# Patient Record
Sex: Female | Born: 1955 | Race: White | Hispanic: No | Marital: Married | Smoking: Former smoker
Health system: Southern US, Community
[De-identification: ages and names within clinical notes are randomized; demographics above are authoritative.]

## PROBLEM LIST (undated history)

## (undated) DIAGNOSIS — K635 Polyp of colon: Secondary | ICD-10-CM

## (undated) DIAGNOSIS — M1990 Unspecified osteoarthritis, unspecified site: Secondary | ICD-10-CM

## (undated) DIAGNOSIS — N7091 Salpingitis, unspecified: Secondary | ICD-10-CM

## (undated) DIAGNOSIS — M810 Age-related osteoporosis without current pathological fracture: Secondary | ICD-10-CM

## (undated) HISTORY — PX: BREAST EXCISIONAL BIOPSY: SUR124

## (undated) HISTORY — PX: OTHER SURGICAL HISTORY: SHX169

## (undated) HISTORY — DX: Unspecified osteoarthritis, unspecified site: M19.90

## (undated) HISTORY — DX: Salpingitis, unspecified: N70.91

## (undated) HISTORY — DX: Polyp of colon: K63.5

## (undated) HISTORY — PX: COLONOSCOPY: SHX174

## (undated) HISTORY — PX: POLYPECTOMY: SHX149

## (undated) HISTORY — PX: APPENDECTOMY: SHX54

## (undated) HISTORY — DX: Age-related osteoporosis without current pathological fracture: M81.0

---

## 1998-01-17 ENCOUNTER — Other Ambulatory Visit: Admission: RE | Admit: 1998-01-17 | Discharge: 1998-01-17 | Payer: Self-pay | Admitting: Obstetrics and Gynecology

## 1998-02-20 ENCOUNTER — Ambulatory Visit (HOSPITAL_BASED_OUTPATIENT_CLINIC_OR_DEPARTMENT_OTHER): Admission: RE | Admit: 1998-02-20 | Discharge: 1998-02-20 | Payer: Self-pay

## 1999-02-17 ENCOUNTER — Other Ambulatory Visit: Admission: RE | Admit: 1999-02-17 | Discharge: 1999-02-17 | Payer: Self-pay | Admitting: Obstetrics and Gynecology

## 1999-04-08 ENCOUNTER — Encounter: Admission: RE | Admit: 1999-04-08 | Discharge: 1999-04-08 | Payer: Self-pay | Admitting: Internal Medicine

## 1999-09-19 ENCOUNTER — Emergency Department (HOSPITAL_COMMUNITY): Admission: EM | Admit: 1999-09-19 | Discharge: 1999-09-19 | Payer: Self-pay | Admitting: Emergency Medicine

## 1999-09-19 ENCOUNTER — Encounter: Payer: Self-pay | Admitting: Emergency Medicine

## 2000-04-14 ENCOUNTER — Encounter: Payer: Self-pay | Admitting: Internal Medicine

## 2000-04-14 ENCOUNTER — Encounter: Admission: RE | Admit: 2000-04-14 | Discharge: 2000-04-14 | Payer: Self-pay | Admitting: Internal Medicine

## 2000-04-15 ENCOUNTER — Other Ambulatory Visit: Admission: RE | Admit: 2000-04-15 | Discharge: 2000-04-15 | Payer: Self-pay | Admitting: Gynecology

## 2000-10-02 HISTORY — PX: FINGER FRACTURE SURGERY: SHX638

## 2001-12-14 ENCOUNTER — Other Ambulatory Visit: Admission: RE | Admit: 2001-12-14 | Discharge: 2001-12-14 | Payer: Self-pay | Admitting: Gynecology

## 2001-12-23 ENCOUNTER — Encounter: Admission: RE | Admit: 2001-12-23 | Discharge: 2001-12-23 | Payer: Self-pay | Admitting: Gynecology

## 2001-12-23 ENCOUNTER — Encounter: Payer: Self-pay | Admitting: Gynecology

## 2002-12-26 ENCOUNTER — Encounter: Payer: Self-pay | Admitting: Gynecology

## 2002-12-26 ENCOUNTER — Encounter: Admission: RE | Admit: 2002-12-26 | Discharge: 2002-12-26 | Payer: Self-pay | Admitting: Gynecology

## 2003-01-02 ENCOUNTER — Other Ambulatory Visit: Admission: RE | Admit: 2003-01-02 | Discharge: 2003-01-02 | Payer: Self-pay | Admitting: Gynecology

## 2004-01-17 ENCOUNTER — Encounter: Admission: RE | Admit: 2004-01-17 | Discharge: 2004-01-17 | Payer: Self-pay | Admitting: Gynecology

## 2004-01-23 ENCOUNTER — Other Ambulatory Visit: Admission: RE | Admit: 2004-01-23 | Discharge: 2004-01-23 | Payer: Self-pay | Admitting: Gynecology

## 2005-01-29 ENCOUNTER — Other Ambulatory Visit: Admission: RE | Admit: 2005-01-29 | Discharge: 2005-01-29 | Payer: Self-pay | Admitting: Gynecology

## 2005-01-29 ENCOUNTER — Encounter: Admission: RE | Admit: 2005-01-29 | Discharge: 2005-01-29 | Payer: Self-pay | Admitting: Gynecology

## 2005-02-04 ENCOUNTER — Encounter: Admission: RE | Admit: 2005-02-04 | Discharge: 2005-02-04 | Payer: Self-pay | Admitting: Internal Medicine

## 2005-02-06 ENCOUNTER — Encounter: Admission: RE | Admit: 2005-02-06 | Discharge: 2005-02-06 | Payer: Self-pay | Admitting: Internal Medicine

## 2005-07-14 ENCOUNTER — Encounter: Admission: RE | Admit: 2005-07-14 | Discharge: 2005-07-14 | Payer: Self-pay | Admitting: Internal Medicine

## 2006-02-01 ENCOUNTER — Other Ambulatory Visit: Admission: RE | Admit: 2006-02-01 | Discharge: 2006-02-01 | Payer: Self-pay | Admitting: Gynecology

## 2006-02-01 ENCOUNTER — Encounter: Admission: RE | Admit: 2006-02-01 | Discharge: 2006-02-01 | Payer: Self-pay | Admitting: Gynecology

## 2007-01-11 ENCOUNTER — Ambulatory Visit (HOSPITAL_BASED_OUTPATIENT_CLINIC_OR_DEPARTMENT_OTHER): Admission: RE | Admit: 2007-01-11 | Discharge: 2007-01-11 | Payer: Self-pay | Admitting: Orthopedic Surgery

## 2007-02-07 ENCOUNTER — Other Ambulatory Visit: Admission: RE | Admit: 2007-02-07 | Discharge: 2007-02-07 | Payer: Self-pay | Admitting: Gynecology

## 2007-02-09 ENCOUNTER — Encounter: Admission: RE | Admit: 2007-02-09 | Discharge: 2007-02-09 | Payer: Self-pay | Admitting: Gynecology

## 2008-02-08 ENCOUNTER — Other Ambulatory Visit: Admission: RE | Admit: 2008-02-08 | Discharge: 2008-02-08 | Payer: Self-pay | Admitting: Gynecology

## 2008-02-10 ENCOUNTER — Encounter: Admission: RE | Admit: 2008-02-10 | Discharge: 2008-02-10 | Payer: Self-pay | Admitting: Gynecology

## 2008-02-21 ENCOUNTER — Ambulatory Visit: Payer: Self-pay | Admitting: Internal Medicine

## 2009-02-12 ENCOUNTER — Encounter: Admission: RE | Admit: 2009-02-12 | Discharge: 2009-02-12 | Payer: Self-pay | Admitting: Gynecology

## 2009-02-25 ENCOUNTER — Ambulatory Visit: Payer: Self-pay | Admitting: Internal Medicine

## 2010-02-27 ENCOUNTER — Ambulatory Visit: Payer: Self-pay | Admitting: Internal Medicine

## 2010-03-21 ENCOUNTER — Ambulatory Visit: Payer: Self-pay | Admitting: Internal Medicine

## 2010-04-17 ENCOUNTER — Ambulatory Visit: Payer: Self-pay | Admitting: Internal Medicine

## 2010-09-09 ENCOUNTER — Telehealth: Payer: Self-pay | Admitting: Internal Medicine

## 2010-09-09 ENCOUNTER — Emergency Department (HOSPITAL_COMMUNITY)
Admission: EM | Admit: 2010-09-09 | Discharge: 2010-09-09 | Disposition: A | Discharge status: Home / Self Care | Payer: BC Managed Care – PPO | Attending: Emergency Medicine | Admitting: Emergency Medicine

## 2010-09-09 DIAGNOSIS — M069 Rheumatoid arthritis, unspecified: Secondary | ICD-10-CM | POA: Insufficient documentation

## 2010-09-09 DIAGNOSIS — S91309A Unspecified open wound, unspecified foot, initial encounter: Secondary | ICD-10-CM | POA: Insufficient documentation

## 2010-09-09 DIAGNOSIS — W268XXA Contact with other sharp object(s), not elsewhere classified, initial encounter: Secondary | ICD-10-CM | POA: Insufficient documentation

## 2010-09-09 NOTE — Telephone Encounter (Signed)
MD aware. No further orders obtained.

## 2010-09-16 NOTE — Op Note (Signed)
Katrina [name], Katrina [name]               ACCOUNT NO.:  [id number]   MEDICAL RECORD NO.:  [id number]          PATIENT TYPE:  AMB   LOCATION:  DSC                          FACILITY:  [facility]   PHYSICIAN:  Katy Fitch. Sypher, M.D. 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   OPERATION:  Release of right thumb A1 pulley.   OPERATING SURGEON:  Josephine Igo, M.D.   ASSISTANT:  Annye Rusk, PA-C   ANESTHESIA:  2% lidocaine metacarpal head level block of right thumb  with flexor sheath infiltration supplemented by IV sedation.   SUPERVISING ANESTHESIOLOGIST:  Dr. Sampson Goon.   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   She and her husband are [employer] and originally contacted me by  the internet stating that she had developed locking of her right thumb.   She attempted to seek treatment in the [place] without success.  Upon  return she was treated with steroid injection and activity modification  without success.  She has a background history of rheumatoid arthritis  managed by Dr. Syliva Overman and has done extremely well on a  combination of methotrexate and Mobic.  Due to a failure to respond to  nonoperative measures, she now presents for release of her right thumb  A1 pulley to correct her chronic trigger thumb prior to her next sailing  adventure.   PROCEDURE:  Katrina [name]  is brought to the operating room and placed in  the supine position upon the operating table.  Following an anesthesia  consult with Dr. Sampson Goon local anesthesia supplemented by IV sedation  was  recommended and selected by Katrina [name].   She was brought to room one and placed in the supine position upon the  operating table and under Dr. Jarrett Ables supervision IV sedation  provided.  The right arm was prepped with Betadine soap solution and  sterilely draped.  2% lidocaine was infiltrated in the path of the  intended incision.  The flexor sheath was infiltrated as well.   After a few moments excellent anesthesia of the thumb was achieved.  The  right arm was prepped with Betadine soap and solution and sterilely  draped with sterile stockinette and impervious arthroscopy drapes.   The procedure commenced with exsanguination of Katrina [name]'s right arm with an  Esmarch bandage and inflation of the arterial tourniquet to 220 mmHg.  A  short transverse incision was fashioned directly over the palpably  thickened A1 pulley.  The subcutaneous tissues were carefully divided  taking care to identify the radial proper digital nerve to the thumb.  The subcutaneous fascia was moderately fibrotic due to the prolonged  inflammation of the flexor sheath.   The A1 pulley was gently isolated by elevation of fibrotic  tissue with a  Therapist, nutritional followed by placement of blunt retractors.  The A1 pulley  was incised with a scalpel with the incision lengthened proximally and  distally the full length of the pulley with tenotomy scissors.  The  flexor pollicis longus was identified and found to have a significant  swelling that was causing locking proximal to the pulley.   The oblique pulley and A2 pulleys were preserved.  Katrina [name]  demonstrated full active range of motion of the IP joint on the  operating table.  The wound was then inspected for bleeding points  followed by repair of the skin with a mattress suture of 5-0 nylon.  There were no apparent complications.   Katrina [name] tolerated surgery and anesthesia well.  She was transferred to  the recovery room with  stable signs.   For aftercare  she is provided a prescription for Darvocet N 100 one p.o.  q.4-6h. p.r.n. pain, 20 tablets without refill.      Katy Fitch Sypher, M.D.  Electronically Signed     RVS/MEDQ  D:  01/11/2007  T:  01/12/2007  Job:  [id number]   cc:   Lemmie Evens, M.D.  Katy Fitch Sypher, M.D.

## 2011-02-13 LAB — POCT HEMOGLOBIN-HEMACUE
Hemoglobin: 13.7
Operator id: 112821

## 2011-03-06 ENCOUNTER — Encounter: Payer: Self-pay | Admitting: Internal Medicine

## 2011-03-09 ENCOUNTER — Other Ambulatory Visit: Payer: BC Managed Care – PPO | Admitting: Internal Medicine

## 2011-03-09 DIAGNOSIS — M069 Rheumatoid arthritis, unspecified: Secondary | ICD-10-CM

## 2011-03-09 DIAGNOSIS — Z79899 Other long term (current) drug therapy: Secondary | ICD-10-CM

## 2011-03-09 LAB — COMPREHENSIVE METABOLIC PANEL
ALT: 15 U/L (ref 0–35)
AST: 20 U/L (ref 0–37)
Albumin: 4.3 g/dL (ref 3.5–5.2)
Alkaline Phosphatase: 60 U/L (ref 39–117)
BUN: 13 mg/dL (ref 6–23)
CO2: 29 mEq/L (ref 19–32)
Calcium: 9.3 mg/dL (ref 8.4–10.5)
Chloride: 104 mEq/L (ref 96–112)
Creat: 0.68 mg/dL (ref 0.50–1.10)
Glucose, Bld: 88 mg/dL (ref 70–99)
Potassium: 5.1 mEq/L (ref 3.5–5.3)
Sodium: 139 mEq/L (ref 135–145)
Total Bilirubin: 0.7 mg/dL (ref 0.3–1.2)
Total Protein: 7 g/dL (ref 6.0–8.3)

## 2011-03-09 LAB — CBC WITH DIFFERENTIAL/PLATELET
Basophils Absolute: 0.1 10*3/uL (ref 0.0–0.1)
Basophils Relative: 1 % (ref 0–1)
Eosinophils Absolute: 0.3 10*3/uL (ref 0.0–0.7)
Eosinophils Relative: 4 % (ref 0–5)
HCT: 43.5 % (ref 36.0–46.0)
Hemoglobin: 14.2 g/dL (ref 12.0–15.0)
Lymphocytes Relative: 44 % (ref 12–46)
Lymphs Abs: 2.9 10*3/uL (ref 0.7–4.0)
MCH: 31.8 pg (ref 26.0–34.0)
MCHC: 32.6 g/dL (ref 30.0–36.0)
MCV: 97.3 fL (ref 78.0–100.0)
Monocytes Absolute: 0.6 10*3/uL (ref 0.1–1.0)
Monocytes Relative: 9 % (ref 3–12)
Neutro Abs: 2.7 10*3/uL (ref 1.7–7.7)
Neutrophils Relative %: 42 % — ABNORMAL LOW (ref 43–77)
Platelets: 274 10*3/uL (ref 150–400)
RBC: 4.47 MIL/uL (ref 3.87–5.11)
RDW: 13.2 % (ref 11.5–15.5)
WBC: 6.5 10*3/uL (ref 4.0–10.5)

## 2011-03-09 LAB — TSH: TSH: 2.504 u[IU]/mL (ref 0.350–4.500)

## 2011-03-09 LAB — VITAMIN D 25 HYDROXY (VIT D DEFICIENCY, FRACTURES): Vit D, 25-Hydroxy: 60 ng/mL (ref 30–89)

## 2011-03-10 ENCOUNTER — Ambulatory Visit (INDEPENDENT_AMBULATORY_CARE_PROVIDER_SITE_OTHER): Payer: BC Managed Care – PPO | Admitting: Internal Medicine

## 2011-03-10 ENCOUNTER — Encounter: Payer: Self-pay | Admitting: Internal Medicine

## 2011-03-10 VITALS — BP 102/66 | HR 68 | Temp 97.7°F | Ht 62.0 in | Wt 136.0 lb

## 2011-03-10 DIAGNOSIS — Z23 Encounter for immunization: Secondary | ICD-10-CM

## 2011-03-10 DIAGNOSIS — M069 Rheumatoid arthritis, unspecified: Secondary | ICD-10-CM

## 2011-03-10 DIAGNOSIS — Z0000 Encounter for general adult medical examination without abnormal findings: Secondary | ICD-10-CM

## 2011-03-10 LAB — LIPID PANEL
Cholesterol: 181 mg/dL (ref 0–200)
HDL: 74 mg/dL (ref >39)
LDL Cholesterol: 86 mg/dL (ref 0–99)
Total CHOL/HDL Ratio: 2.4 Ratio
Triglycerides: 107 mg/dL (ref <150)
VLDL: 21 mg/dL (ref 0–40)

## 2011-03-10 LAB — POCT URINALYSIS DIPSTICK
Bilirubin, UA: NEGATIVE
Blood, UA: NEGATIVE
Glucose, UA: NEGATIVE
Ketones, UA: NEGATIVE
Leukocytes, UA: NEGATIVE
Nitrite, UA: NEGATIVE
Protein, UA: NEGATIVE
Spec Grav, UA: 1.01
Urobilinogen, UA: 0.2
pH, UA: 6.5

## 2011-04-05 ENCOUNTER — Encounter: Payer: Self-pay | Admitting: Internal Medicine

## 2011-04-05 DIAGNOSIS — M069 Rheumatoid arthritis, unspecified: Secondary | ICD-10-CM | POA: Insufficient documentation

## 2011-04-05 NOTE — Progress Notes (Signed)
  Subjective:    Patient ID: Katrina [name], female    DOB: 1955-12-28, 55 y.o.   MRN: [id number]  HPI 55 year old white female with history of rheumatoid arthritis doing well on Humira for health maintenance and they wish and medical problems. History of fractured third metatarsal 2008. History of right trigger finger release September 2008. No known drug allergies. Had colonoscopy 2007. Dr. Lorenso Courier is GYN physician. Had Pneumovax October 2010. Gets annual influenza immunization in 1 was given today.Tdap given December 2011.  Dose of Humira is 40 mg subcutaneously every other week. She does take methotrexate 20 mg weekly. She does take vitamin D and calcium.  Additional history: Appendectomy 1976.  Family history remarkable for father with hypertension.  Social history patient does not smoke social alcohol consumption. 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 She and her husband as but considerable time sailing. She quit her job to do this when she returns she has been unable to find employment. History of polyarthritis  involving hands wrists shoulders knees ankles and toes. Strongly positive CCP antibody. Diagnosed with rheumatoid arthritis by Dr. Jimmy Footman    Review of Systems  Constitutional: Negative.   HENT: Negative.   Eyes: Negative.   Respiratory: Negative.   Cardiovascular: Negative.   Genitourinary: Negative.   Musculoskeletal:       Pain in hands, feet, knees, and ankles, wrist  Neurological: Negative.   Hematological: Negative.   Psychiatric/Behavioral: Negative.        Objective:   Physical Exam  Vitals reviewed. Constitutional: She is oriented to person, place, and time. She appears well-nourished. No distress.  HENT:  Head: Normocephalic.  Right Ear: External ear normal.  Left Ear: External ear normal.  Mouth/Throat: Oropharynx is clear and moist.  Eyes: Conjunctivae and EOM are normal. Pupils are equal, round, and reactive to light. No  scleral icterus.  Neck: Neck supple. No JVD present. No thyromegaly present.  Cardiovascular: Normal rate, regular rhythm, normal heart sounds and intact distal pulses.   No murmur heard. Abdominal: Soft. Bowel sounds are normal. She exhibits no mass. There is no tenderness.  Genitourinary:       Deferred to GYN  Musculoskeletal: She exhibits tenderness. She exhibits no edema.       Some tenderness in joints in hands bilaterally. No erythema or increased warmth in hands  Lymphadenopathy:    She has no cervical adenopathy.  Neurological: She is alert and oriented to person, place, and time. She has normal reflexes. No cranial nerve deficit. Coordination normal.  Skin: Skin is warm and dry.  Psychiatric: She has a normal mood and affect. Her behavior is normal.          Assessment & Plan:  Rheumatoid arthritis-stable on Humira and methotrexate. Followed by rheumatologist at [facility].  Plan return one year or as needed. Influenza immunization given today

## 2011-04-05 NOTE — Patient Instructions (Signed)
Return in one year. Influenza immunization given today. Continue to follow up with rheumatologist for rheumatoid arthritis

## 2012-01-27 ENCOUNTER — Ambulatory Visit (INDEPENDENT_AMBULATORY_CARE_PROVIDER_SITE_OTHER): Payer: BC Managed Care – PPO | Admitting: Internal Medicine

## 2012-01-27 DIAGNOSIS — Z23 Encounter for immunization: Secondary | ICD-10-CM

## 2012-01-28 ENCOUNTER — Ambulatory Visit: Payer: BC Managed Care – PPO | Admitting: Internal Medicine

## 2012-03-28 ENCOUNTER — Other Ambulatory Visit: Payer: BC Managed Care – PPO | Admitting: Internal Medicine

## 2012-03-28 DIAGNOSIS — M069 Rheumatoid arthritis, unspecified: Secondary | ICD-10-CM

## 2012-03-28 DIAGNOSIS — Z79899 Other long term (current) drug therapy: Secondary | ICD-10-CM

## 2012-03-28 LAB — CBC WITH DIFFERENTIAL/PLATELET
Basophils Absolute: 0.1 10*3/uL (ref 0.0–0.1)
Basophils Relative: 1 % (ref 0–1)
Eosinophils Absolute: 0.3 10*3/uL (ref 0.0–0.7)
Eosinophils Relative: 4 % (ref 0–5)
HCT: 42.5 % (ref 36.0–46.0)
Hemoglobin: 14.2 g/dL (ref 12.0–15.0)
Lymphocytes Relative: 36 % (ref 12–46)
Lymphs Abs: 2.7 10*3/uL (ref 0.7–4.0)
MCH: 31.9 pg (ref 26.0–34.0)
MCHC: 33.4 g/dL (ref 30.0–36.0)
MCV: 95.5 fL (ref 78.0–100.0)
Monocytes Absolute: 0.7 10*3/uL (ref 0.1–1.0)
Monocytes Relative: 10 % (ref 3–12)
Neutro Abs: 3.6 10*3/uL (ref 1.7–7.7)
Neutrophils Relative %: 49 % (ref 43–77)
Platelets: 298 10*3/uL (ref 150–400)
RBC: 4.45 MIL/uL (ref 3.87–5.11)
RDW: 13.2 % (ref 11.5–15.5)
WBC: 7.4 10*3/uL (ref 4.0–10.5)

## 2012-03-28 LAB — LIPID PANEL
Cholesterol: 187 mg/dL (ref 0–200)
HDL: 82 mg/dL (ref >39)
LDL Cholesterol: 90 mg/dL (ref 0–99)
Total CHOL/HDL Ratio: 2.3 Ratio
Triglycerides: 77 mg/dL (ref <150)
VLDL: 15 mg/dL (ref 0–40)

## 2012-03-28 LAB — COMPREHENSIVE METABOLIC PANEL
ALT: 15 U/L (ref 0–35)
AST: 17 U/L (ref 0–37)
Albumin: 4.1 g/dL (ref 3.5–5.2)
Alkaline Phosphatase: 66 U/L (ref 39–117)
BUN: 13 mg/dL (ref 6–23)
CO2: 29 mEq/L (ref 19–32)
Calcium: 9.2 mg/dL (ref 8.4–10.5)
Chloride: 104 mEq/L (ref 96–112)
Creat: 0.64 mg/dL (ref 0.50–1.10)
Glucose, Bld: 79 mg/dL (ref 70–99)
Potassium: 4.6 mEq/L (ref 3.5–5.3)
Sodium: 138 mEq/L (ref 135–145)
Total Bilirubin: 0.6 mg/dL (ref 0.3–1.2)
Total Protein: 6.9 g/dL (ref 6.0–8.3)

## 2012-03-28 LAB — TSH: TSH: 3.24 u[IU]/mL (ref 0.350–4.500)

## 2012-03-29 ENCOUNTER — Ambulatory Visit (INDEPENDENT_AMBULATORY_CARE_PROVIDER_SITE_OTHER): Payer: BC Managed Care – PPO | Admitting: Internal Medicine

## 2012-03-29 ENCOUNTER — Encounter: Payer: Self-pay | Admitting: Internal Medicine

## 2012-03-29 VITALS — BP 98/60 | HR 72 | Temp 98.6°F | Ht 63.0 in | Wt 141.0 lb

## 2012-03-29 DIAGNOSIS — M069 Rheumatoid arthritis, unspecified: Secondary | ICD-10-CM

## 2012-03-29 LAB — VITAMIN D 25 HYDROXY (VIT D DEFICIENCY, FRACTURES): Vit D, 25-Hydroxy: 60 ng/mL (ref 30–89)

## 2012-03-29 NOTE — Progress Notes (Signed)
  Subjective:    Patient ID: Katrina [name], female    DOB: [birth year], 56 y.o.   MRN: [id number]  HPI  56 year old White female for health maintenance and evaluation of medical problems. History of rheumatoid arthritis on MTX and Humira. Followed by Dr. Dierdre Forth. Had recent GYN exam per Dr. Chevis Pretty. Bone density done at GYN office. Pt reports bone loss after being off Fosamax for about a year. She will discuss bone density results with Dr. Dierdre Forth.  No new complaints or problems. Feels pretty well. Rheumatoid arthritis is under good control.  Right trigger finger release September 2008  Fractured left third metatarsal June 2008  No known drug allergies  Family history: Mother with history of lung cancer. Father with history of hypertension. One brother and one sister in good health.  Social history: Patient is married. No children. 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 Patient does not smoke. Social alcohol consumption. She does exercise regularly and watches her weight.    Review of Systems  Constitutional: Negative.   HENT: Negative.   Eyes: Negative.   Respiratory: Negative.   Cardiovascular: Negative.   Gastrointestinal: Negative.   Genitourinary: Negative.   Musculoskeletal:       History of rheumatoid arthritis but denies significant hand pain or joint pain at this point in time  Neurological: Negative.   Hematological: Negative.   Psychiatric/Behavioral: Negative.        Objective:   Physical Exam  Vitals reviewed. Constitutional: She is oriented to person, place, and time. She appears well-developed and well-nourished.  HENT:  Head: Normocephalic and atraumatic.  Right Ear: External ear normal.  Left Ear: External ear normal.  Mouth/Throat: Oropharynx is clear and moist. No oropharyngeal exudate.  Eyes: Conjunctivae normal and EOM are normal. Pupils are equal, round, and reactive to light. Right eye exhibits  no discharge. No scleral icterus.  Neck: Neck supple. No JVD present. No thyromegaly present.  Cardiovascular: Normal rate, regular rhythm, normal heart sounds and intact distal pulses.   No murmur heard. Pulmonary/Chest: Effort normal and breath sounds normal. She has no wheezes. She has no rales.  Abdominal: Bowel sounds are normal. She exhibits no distension. There is no tenderness. There is no rebound and no guarding.  Genitourinary:       Deferred to GYN  Musculoskeletal: Normal range of motion. She exhibits no edema.  Lymphadenopathy:    She has no cervical adenopathy.  Neurological: She is alert and oriented to person, place, and time. Coordination normal.  Skin: Skin is warm and dry. No rash noted. She is not diaphoretic.  Psychiatric: She has a normal mood and affect. Her behavior is normal. Judgment and thought content normal.          Assessment & Plan:  Rheumatoid arthritis  Plan: Continue methotrexate and Humira. Return in one year or as needed. Continue to see rheumatologist.

## 2012-05-04 HISTORY — PX: TONSILLECTOMY AND ADENOIDECTOMY: SHX28

## 2013-01-23 ENCOUNTER — Encounter: Payer: Self-pay | Admitting: Internal Medicine

## 2013-01-23 ENCOUNTER — Ambulatory Visit (INDEPENDENT_AMBULATORY_CARE_PROVIDER_SITE_OTHER): Payer: BC Managed Care – PPO | Admitting: Internal Medicine

## 2013-01-23 VITALS — BP 124/70 | Temp 98.7°F | Wt 136.0 lb

## 2013-01-23 DIAGNOSIS — M069 Rheumatoid arthritis, unspecified: Secondary | ICD-10-CM

## 2013-01-23 DIAGNOSIS — J029 Acute pharyngitis, unspecified: Secondary | ICD-10-CM

## 2013-01-23 LAB — POCT RAPID STREP A (OFFICE): Rapid Strep A Screen: NEGATIVE

## 2013-01-23 MED ORDER — AMOXICILLIN 500 MG PO CAPS: 500.0000 mg | ORAL_CAPSULE | Freq: Three times a day (TID) | ORAL | Status: DC

## 2013-01-23 NOTE — Progress Notes (Addendum)
  Subjective:    Patient ID: Katrina [name], female    DOB: 1955-11-20, 57 y.o.   MRN: [id number]  HPI  Patient here complaining of sore throat which has gotten worse over the past couple of days. She has a history of rheumatoid arthritis and is on Humira and methotrexate which she receives every 2 weeks. Says she has a past history of tonsillar abscess in the 1990s during by Dr. Berna Bue. She is concern she may have a recurrence of a tonsillar abscess. No fever or shaking chills. No headache. No cough or congestion.    Review of Systems     Objective:   Physical Exam Pharynx only very slightly injected without any exudate whatsoever. Tonsils are not enlarged. Rapid strep screen is negative. TMs are slightly full bilaterally. Neck is supple. Small anterior cervical nodes bilaterally. Chest is clear to auscultation.        Assessment & Plan:  Pharyngitis-nonstrep  Anxiety  History rheumatoid arthritis  Plan: Patient is very anxious about possible tonsillar abscess. I do not feel that she has this at this point in time. Have prescribed amoxicillin 500 mg by mouth 3 times a day for 7 days. If she's feeling better after 7 days of treatment, I think she can proceed with Humira and methotrexate next week instead of this week. Recommended Tylenol for pain but she says that acetaminophen has caused increased liver functions in the past. She's been taking Mobic when necessary which she says does not affect her liver functions.  02/02/2013 note from Dr. Narda Bonds indicates patient was seen by him with suspected left peritonsillar abscess and change from amoxicillin to Augmentin. She continued to have symptoms and he performed a tonsillectomy.

## 2013-01-23 NOTE — Patient Instructions (Addendum)
Amoxicillin 500 mg tid x 10 days. Call if not better in 48 hours or sooner if worse. Check with rheumatologist regarding deferment on Humira and methotrexate this week

## 2013-02-02 ENCOUNTER — Other Ambulatory Visit: Payer: Self-pay | Admitting: Otolaryngology

## 2013-02-21 ENCOUNTER — Telehealth: Payer: Self-pay | Admitting: Internal Medicine

## 2013-02-21 NOTE — Telephone Encounter (Signed)
Patient was seen recently with peritonsillar abscess. She had tonsillectomy. Pathologist called Dr. Ezzard Standing and told him pathology looked a bit atypical. There was some concern about lymphoma but apparently pathologist has told Dr. Ezzard Standing special test is negative for lymphoma. Patient needs to be watched for adenopathy.

## 2013-03-08 ENCOUNTER — Ambulatory Visit (INDEPENDENT_AMBULATORY_CARE_PROVIDER_SITE_OTHER): Payer: BC Managed Care – PPO | Admitting: Internal Medicine

## 2013-03-08 DIAGNOSIS — Z23 Encounter for immunization: Secondary | ICD-10-CM

## 2013-04-03 ENCOUNTER — Other Ambulatory Visit: Payer: BC Managed Care – PPO | Admitting: Internal Medicine

## 2013-04-03 DIAGNOSIS — Z13220 Encounter for screening for lipoid disorders: Secondary | ICD-10-CM

## 2013-04-03 DIAGNOSIS — Z130 Encounter for screening for diseases of the blood and blood-forming organs and certain disorders involving the immune mechanism: Secondary | ICD-10-CM

## 2013-04-03 DIAGNOSIS — Z1329 Encounter for screening for other suspected endocrine disorder: Secondary | ICD-10-CM

## 2013-04-03 DIAGNOSIS — M069 Rheumatoid arthritis, unspecified: Secondary | ICD-10-CM

## 2013-04-03 LAB — CBC WITH DIFFERENTIAL/PLATELET
Basophils Absolute: 0.1 10*3/uL (ref 0.0–0.1)
Basophils Relative: 1 % (ref 0–1)
Eosinophils Absolute: 0.2 10*3/uL (ref 0.0–0.7)
Eosinophils Relative: 3 % (ref 0–5)
HCT: 42.2 % (ref 36.0–46.0)
Hemoglobin: 14.4 g/dL (ref 12.0–15.0)
Lymphocytes Relative: 43 % (ref 12–46)
Lymphs Abs: 2.5 10*3/uL (ref 0.7–4.0)
MCH: 31.8 pg (ref 26.0–34.0)
MCHC: 34.1 g/dL (ref 30.0–36.0)
MCV: 93.2 fL (ref 78.0–100.0)
Monocytes Absolute: 0.4 10*3/uL (ref 0.1–1.0)
Monocytes Relative: 6 % (ref 3–12)
Neutro Abs: 2.8 10*3/uL (ref 1.7–7.7)
Neutrophils Relative %: 47 % (ref 43–77)
Platelets: 285 10*3/uL (ref 150–400)
RBC: 4.53 MIL/uL (ref 3.87–5.11)
RDW: 13.6 % (ref 11.5–15.5)
WBC: 5.9 10*3/uL (ref 4.0–10.5)

## 2013-04-03 LAB — COMPREHENSIVE METABOLIC PANEL
ALT: 17 U/L (ref 0–35)
AST: 21 U/L (ref 0–37)
Albumin: 4.1 g/dL (ref 3.5–5.2)
Alkaline Phosphatase: 73 U/L (ref 39–117)
BUN: 13 mg/dL (ref 6–23)
CO2: 28 mEq/L (ref 19–32)
Calcium: 9.2 mg/dL (ref 8.4–10.5)
Chloride: 104 mEq/L (ref 96–112)
Creat: 0.67 mg/dL (ref 0.50–1.10)
Glucose, Bld: 82 mg/dL (ref 70–99)
Potassium: 4 mEq/L (ref 3.5–5.3)
Sodium: 140 mEq/L (ref 135–145)
Total Bilirubin: 0.8 mg/dL (ref 0.3–1.2)
Total Protein: 6.9 g/dL (ref 6.0–8.3)

## 2013-04-03 LAB — LIPID PANEL
Cholesterol: 186 mg/dL (ref 0–200)
HDL: 80 mg/dL (ref >39)
LDL Cholesterol: 89 mg/dL (ref 0–99)
Total CHOL/HDL Ratio: 2.3 Ratio
Triglycerides: 83 mg/dL (ref <150)
VLDL: 17 mg/dL (ref 0–40)

## 2013-04-03 LAB — TSH: TSH: 1.902 u[IU]/mL (ref 0.350–4.500)

## 2013-04-04 ENCOUNTER — Ambulatory Visit (INDEPENDENT_AMBULATORY_CARE_PROVIDER_SITE_OTHER): Payer: BC Managed Care – PPO | Admitting: Internal Medicine

## 2013-04-04 ENCOUNTER — Encounter: Payer: Self-pay | Admitting: Internal Medicine

## 2013-04-04 VITALS — BP 118/64 | HR 68 | Temp 97.1°F | Ht 63.0 in | Wt 136.0 lb

## 2013-04-04 DIAGNOSIS — M069 Rheumatoid arthritis, unspecified: Secondary | ICD-10-CM

## 2013-04-04 DIAGNOSIS — Z0000 Encounter for general adult medical examination without abnormal findings: Secondary | ICD-10-CM

## 2013-04-04 LAB — VITAMIN D 25 HYDROXY (VIT D DEFICIENCY, FRACTURES): Vit D, 25-Hydroxy: 65 ng/mL (ref 30–89)

## 2013-04-04 NOTE — Progress Notes (Signed)
   Subjective:    Patient ID: Katrina [name], female    DOB: 27-Feb-1956, 57 y.o.   MRN: [id number]  HPI For health maintenance and evaluation of medical issues. History of rheumatoid arthritis treated with methotrexate and Humira. Followed by Dr. Dierdre Forth. Has been advised not to have Zostavax vaccine by rheumatologist. GYN physician is Dr. Irish Lack. Has bone density study done a GYN office. Feels pretty well. Rheumatoid arthritis is under good control.  Past medical history: Right trigger finger release September 2008. Fractured left third metatarsal June 2008. Left peritonsillar abscess Fall 2014 followed by tonsillectomy by Dr. Ezzard Standing October 2014.  No known drug allergies.  Family history: Maternal aunt died recently of lung cancer. Mother with history of lung cancer. Father with history of hypertension. One brother and one sister in good health.  Social history: Patient is married. No children. Has an MBA from [employer]. She and her husband are semiretired and like to travel. Patient does not smoke. Social alcohol consumption. She does exercise regularly and watches her weight.    Review of Systems  Constitutional: Negative.   HENT: Negative.   Eyes: Negative.   Respiratory: Negative.   Cardiovascular: Negative.   Gastrointestinal: Negative.   Endocrine: Negative.   Genitourinary: Negative.   Allergic/Immunologic: Negative.   Neurological: Negative.   Hematological: Negative.   Psychiatric/Behavioral: Negative.        Objective:   Physical Exam  Vitals reviewed. Constitutional: She is oriented to person, place, and time. She appears well-developed and well-nourished. No distress.  HENT:  Head: Normocephalic and atraumatic.  Right Ear: External ear normal.  Left Ear: External ear normal.  Mouth/Throat: Oropharynx is clear and moist.  Eyes: Conjunctivae and EOM are normal. Pupils are equal, round, and reactive to light. Right eye exhibits no discharge. Left eye exhibits no  discharge. No scleral icterus.  Neck: Neck supple. No JVD present. No thyromegaly present.  Cardiovascular: Normal rate, regular rhythm, normal heart sounds and intact distal pulses.   No murmur heard. Pulmonary/Chest: Effort normal and breath sounds normal. No respiratory distress. She has no wheezes.  Breasts normal female  Abdominal: Soft. Bowel sounds are normal. She exhibits no distension and no mass. There is no tenderness. There is no rebound and no guarding.  Genitourinary:  Deferred to GYN  Musculoskeletal: Normal range of motion. She exhibits no edema.  Lymphadenopathy:    She has no cervical adenopathy.  Neurological: She is alert and oriented to person, place, and time. She has normal reflexes. No cranial nerve deficit. Coordination normal.  Skin: Skin is warm and dry. No rash noted. She is not diaphoretic.  Psychiatric: She has a normal mood and affect. Her behavior is normal. Judgment and thought content normal.          Assessment & Plan:  Rheumatoid arthritis stable on methotrexate and Humira. Note: Rheumatologist advised patient not to receive Zostavax vaccine.  Plan: Return in one year or as needed.

## 2013-04-04 NOTE — Patient Instructions (Signed)
Return in one year or as needed. 

## 2013-06-01 ENCOUNTER — Other Ambulatory Visit: Payer: Self-pay

## 2013-06-01 ENCOUNTER — Telehealth: Payer: Self-pay

## 2013-06-01 MED ORDER — OSELTAMIVIR PHOSPHATE 75 MG PO CAPS: 75.0000 mg | ORAL_CAPSULE | Freq: Two times a day (BID) | ORAL | Status: DC

## 2013-06-01 NOTE — Telephone Encounter (Signed)
Please call in Tamiflu 75 mg bid x 5 days for her.

## 2013-06-01 NOTE — Telephone Encounter (Signed)
Patient informed. Rx sent to [facility]

## 2013-06-01 NOTE — Telephone Encounter (Signed)
Patient and husband will be traveling to [place] in Feb. Requesting a Rx for Tamiflu to take with her.

## 2013-09-09 ENCOUNTER — Encounter: Payer: Self-pay | Admitting: Internal Medicine

## 2014-02-16 ENCOUNTER — Other Ambulatory Visit: Payer: Self-pay

## 2014-02-28 ENCOUNTER — Ambulatory Visit (INDEPENDENT_AMBULATORY_CARE_PROVIDER_SITE_OTHER): Payer: BC Managed Care – PPO | Admitting: Internal Medicine

## 2014-02-28 DIAGNOSIS — Z23 Encounter for immunization: Secondary | ICD-10-CM

## 2014-04-05 ENCOUNTER — Other Ambulatory Visit: Payer: BC Managed Care – PPO | Admitting: Internal Medicine

## 2014-04-05 ENCOUNTER — Other Ambulatory Visit: Payer: Self-pay | Admitting: Gynecology

## 2014-04-05 DIAGNOSIS — M810 Age-related osteoporosis without current pathological fracture: Secondary | ICD-10-CM

## 2014-04-05 DIAGNOSIS — Z1329 Encounter for screening for other suspected endocrine disorder: Secondary | ICD-10-CM

## 2014-04-05 DIAGNOSIS — Z1321 Encounter for screening for nutritional disorder: Secondary | ICD-10-CM

## 2014-04-05 DIAGNOSIS — Z130 Encounter for screening for diseases of the blood and blood-forming organs and certain disorders involving the immune mechanism: Secondary | ICD-10-CM

## 2014-04-05 DIAGNOSIS — Z0000 Encounter for general adult medical examination without abnormal findings: Secondary | ICD-10-CM

## 2014-04-05 DIAGNOSIS — Z13220 Encounter for screening for lipoid disorders: Secondary | ICD-10-CM

## 2014-04-05 HISTORY — DX: Age-related osteoporosis without current pathological fracture: M81.0

## 2014-04-05 LAB — LIPID PANEL
Cholesterol: 168 mg/dL (ref 0–200)
HDL: 86 mg/dL (ref >39)
LDL CALC: 70 mg/dL (ref 0–99)
TRIGLYCERIDES: 60 mg/dL (ref <150)
Total CHOL/HDL Ratio: 2 Ratio
VLDL: 12 mg/dL (ref 0–40)

## 2014-04-05 LAB — COMPREHENSIVE METABOLIC PANEL
ALK PHOS: 74 U/L (ref 39–117)
ALT: 13 U/L (ref 0–35)
AST: 18 U/L (ref 0–37)
Albumin: 3.8 g/dL (ref 3.5–5.2)
BILIRUBIN TOTAL: 0.3 mg/dL (ref 0.2–1.2)
BUN: 16 mg/dL (ref 6–23)
CO2: 27 mEq/L (ref 19–32)
Calcium: 9.1 mg/dL (ref 8.4–10.5)
Chloride: 105 mEq/L (ref 96–112)
Creat: 0.67 mg/dL (ref 0.50–1.10)
Glucose, Bld: 88 mg/dL (ref 70–99)
Potassium: 4.3 mEq/L (ref 3.5–5.3)
Sodium: 139 mEq/L (ref 135–145)
TOTAL PROTEIN: 6.8 g/dL (ref 6.0–8.3)

## 2014-04-05 LAB — TSH: TSH: 3.181 u[IU]/mL (ref 0.350–4.500)

## 2014-04-06 LAB — CBC WITH DIFFERENTIAL/PLATELET
BASOS ABS: 0.1 10*3/uL (ref 0.0–0.1)
Basophils Relative: 2 % — ABNORMAL HIGH (ref 0–1)
EOS ABS: 0.2 10*3/uL (ref 0.0–0.7)
Eosinophils Relative: 3 % (ref 0–5)
HCT: 41.2 % (ref 36.0–46.0)
Hemoglobin: 14.3 g/dL (ref 12.0–15.0)
Lymphocytes Relative: 47 % — ABNORMAL HIGH (ref 12–46)
Lymphs Abs: 2.8 10*3/uL (ref 0.7–4.0)
MCH: 32.2 pg (ref 26.0–34.0)
MCHC: 34.7 g/dL (ref 30.0–36.0)
MCV: 92.8 fL (ref 78.0–100.0)
MPV: 8.7 fL — AB (ref 9.4–12.4)
Monocytes Absolute: 1 10*3/uL (ref 0.1–1.0)
Monocytes Relative: 16 % — ABNORMAL HIGH (ref 3–12)
NEUTROS PCT: 32 % — AB (ref 43–77)
Neutro Abs: 1.9 10*3/uL (ref 1.7–7.7)
Platelets: 256 10*3/uL (ref 150–400)
RBC: 4.44 MIL/uL (ref 3.87–5.11)
RDW: 13.6 % (ref 11.5–15.5)
WBC: 6 10*3/uL (ref 4.0–10.5)

## 2014-04-06 LAB — CYTOLOGY - PAP

## 2014-04-06 LAB — VITAMIN D 25 HYDROXY (VIT D DEFICIENCY, FRACTURES): VIT D 25 HYDROXY: 48 ng/mL (ref 30–100)

## 2014-04-09 ENCOUNTER — Encounter: Payer: Self-pay | Admitting: Internal Medicine

## 2014-05-21 ENCOUNTER — Encounter: Payer: Self-pay | Admitting: Internal Medicine

## 2014-05-21 ENCOUNTER — Ambulatory Visit (INDEPENDENT_AMBULATORY_CARE_PROVIDER_SITE_OTHER): Payer: BLUE CROSS/BLUE SHIELD | Admitting: Internal Medicine

## 2014-05-21 VITALS — BP 106/60 | HR 75 | Temp 98.3°F | Wt 141.0 lb

## 2014-05-21 DIAGNOSIS — Z0000 Encounter for general adult medical examination without abnormal findings: Secondary | ICD-10-CM

## 2014-05-21 LAB — POCT URINALYSIS DIPSTICK
Bilirubin, UA: NEGATIVE
Blood, UA: NEGATIVE
GLUCOSE UA: NEGATIVE
Ketones, UA: NEGATIVE
Leukocytes, UA: NEGATIVE
Nitrite, UA: NEGATIVE
PROTEIN UA: NEGATIVE
Spec Grav, UA: <=1.005
UROBILINOGEN UA: NEGATIVE
pH, UA: 6

## 2014-06-03 ENCOUNTER — Encounter: Payer: Self-pay | Admitting: Internal Medicine

## 2014-06-03 NOTE — Patient Instructions (Signed)
Continue same medications and return in one year. 

## 2014-06-03 NOTE — Progress Notes (Signed)
   Subjective:    Patient ID: Katrina [name], female    DOB: [birth year], 59 y.o.   MRN: [id number]  HPI 59 year old White Female with history of rheumatoid arthritis in today for health maintenance exam and evaluation of medical issues. She is on Humira and does well. Followed by rheumatologist. She also takes methotrexate. Strongly positive CCP antibody. History of polyarthritis involving hands, wrists, shoulders, knees, ankles, and toes. Diagnosed by  Dr. Jimmy Footman at [facility] with rheumatoid arthritis  Dr. Chevis Pretty is GYN physician.  Past medical history: History of fractured third metatarsal 2008. History of right trigger finger release 2008. Appendectomy 1976 .Had colonoscopy in 2007 and again in 2015 with 10 year follow-up recommended. Had Pneumovax immunization October 2010. Gets annual influenza immunization. Tetanus immunization given December 2011. Tonsillectomy 2014 after being diagnosed with peritonsillar abscess by Dr. Ezzard Standing.  No known drug allergies.  Social history: Does not smoke or consume alcohol. 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  Family history: Mother died with lung cancer. Father with history of hypertension died at age 13 with history of congestive heart failure, bladder cancer, macular degeneration.    Review of Systems  Constitutional: Negative.   HENT: Negative.   Eyes: Negative.   Respiratory: Negative.   Cardiovascular: Negative.   Gastrointestinal: Negative.   Musculoskeletal: Positive for arthralgias.  Hematological: Negative.   Psychiatric/Behavioral: Negative.        Objective:   Physical Exam  Constitutional: She is oriented to person, place, and time. She appears well-developed and well-nourished. No distress.  HENT:  Head: Normocephalic and atraumatic.  Right Ear: External ear normal.  Left Ear: External ear normal.  Mouth/Throat:  Oropharynx is clear and moist. No oropharyngeal exudate.  Eyes: Right eye exhibits no discharge. Left eye exhibits no discharge.  Neck: Neck supple. No JVD present. No thyromegaly present.  Cardiovascular: Normal rate, regular rhythm, normal heart sounds and intact distal pulses.   No murmur heard. Pulmonary/Chest: Effort normal and breath sounds normal. No respiratory distress. She has no wheezes. She has no rales. She exhibits no tenderness.  Abdominal: Soft. Bowel sounds are normal. She exhibits no distension and no mass. There is no tenderness. There is no rebound and no guarding.  Genitourinary:  Deferred to GYN. Pap done December 2015 by Dr. Chevis Pretty  Musculoskeletal: Normal range of motion. She exhibits no edema.  Neurological: She is alert and oriented to person, place, and time. She has normal reflexes. She displays normal reflexes. No cranial nerve deficit. Coordination normal.  Skin: Skin is warm and dry. No rash noted. She is not diaphoretic.  Psychiatric: She has a normal mood and affect. Her behavior is normal. Judgment and thought content normal.  Vitals reviewed.         Assessment & Plan:  Rheumatoid arthritis-stable with Humira and methotrexate  History. Tonsillar abscess status post tonsillectomy 2014  Plan: Return in one year or as needed

## 2015-02-26 ENCOUNTER — Ambulatory Visit (INDEPENDENT_AMBULATORY_CARE_PROVIDER_SITE_OTHER): Payer: BLUE CROSS/BLUE SHIELD | Admitting: Internal Medicine

## 2015-02-26 DIAGNOSIS — Z23 Encounter for immunization: Secondary | ICD-10-CM

## 2015-02-26 NOTE — Addendum Note (Signed)
Addended by: Dierdre Forth on: 02/26/2015 11:08 AM   Modules accepted: Level of Service

## 2015-04-10 ENCOUNTER — Ambulatory Visit (INDEPENDENT_AMBULATORY_CARE_PROVIDER_SITE_OTHER): Payer: BLUE CROSS/BLUE SHIELD | Admitting: Obstetrics and Gynecology

## 2015-04-10 ENCOUNTER — Encounter: Payer: Self-pay | Admitting: Obstetrics and Gynecology

## 2015-04-10 VITALS — BP 130/76 | HR 70 | Resp 10 | Ht 62.5 in | Wt 138.4 lb

## 2015-04-10 DIAGNOSIS — Z01419 Encounter for gynecological examination (general) (routine) without abnormal findings: Secondary | ICD-10-CM | POA: Diagnosis not present

## 2015-04-10 NOTE — Patient Instructions (Signed)

## 2015-04-10 NOTE — Progress Notes (Signed)
Patient ID: Katrina [name], female   DOB: Nov 19, 1955, 59 y.o.   MRN: [id number] 59 y.o. G50P0010 Married Caucasian female here for annual exam.   Wants a pap today.   Tonsillectomy in 2014.   Atypical lymphoid hyperplasia. No further treatment needed. Remote hx of tonsillar abscess.   Has a history of a fallopian tube mass years ago which eventually resolved.  This is how she was first introduced to Dr. Chevis Pretty.  Has borderline osteoporosis.  Saw her Rheumatologist who will follow this.  Took Fosamax for 5 years.  Stopped 4 - 5 years ago.   Enjoys sailing. Enjoys traveling.  Has backpacked around the world.  Did not have children.   PCP: Sharlet Salina, MD, Rheumatologist:  Alean Rinne, MD Former patient of Dr. Chevis Pretty and Dr. Rosalio Macadamia.   Knows Wandra Feinstein, NP.   Patient's last menstrual period was 05/04/2002 (approximate).          Sexually active: No.female  The current method of family planning is post menopausal status.    Exercising: Yes.    aerobics and yoga. Smoker:  Former  Health Maintenance: Pap:  04-05-14 Neg:no HR HPV done History of abnormal Pap:  Yes, when she was in her 30's had colposcopy but no treatment to cervix--repeat pap normal. MMG:  04-05-14 normal:done in Dr.Mezer's office. Colonoscopy:  04-19-14 normal in [place];next due 04/2024. BMD:   04-05-14  Result  Osteoporosis;done in Dr.Mezer's office. TDaP:  04-17-10 Screening Labs:  Hb today: PCP, Urine today: PCP   reports that she quit smoking about 24 years ago. Her smoking use included Cigarettes. She smoked 1.00 pack per day. She has never used smokeless tobacco. She reports that she does not drink alcohol or use illicit drugs.  Past Medical History  Diagnosis Date  . Arthritis     --rheumatoid  . Osteoporosis   . Salpingitis     Had appendectomy at same time as salpingitis dx was made.    Past Surgical History  Procedure Laterality Date  . Appendectomy    . Tonsillectomy and adenoidectomy   2014  . Left breast biopsy      Current Outpatient Prescriptions  Medication Sig Dispense Refill  . calcium carbonate (OS-CAL) 600 MG TABS Take 600 mg by mouth 2 (two) times daily with a meal.      . cholecalciferol (VITAMIN D) 1000 UNITS tablet Take by mouth daily.      . fish oil-omega-3 fatty acids 1000 MG capsule Take 2 g by mouth daily.      . folic acid (FOLVITE) 1 MG tablet Take 1 mg by mouth daily.      . methotrexate (RHEUMATREX) 2.5 MG tablet Take 15 mg by mouth once a week. Caution:Chemotherapy. Protect from light.    Marland Kitchen HUMIRA PEN 40 MG/0.8ML PNKT   3   No current facility-administered medications for this visit.    Family History  Problem Relation Age of Onset  . Cancer Mother 27    Dec Lung CA  . Thyroid disease Mother     thyroidectomy in her 20's--unsure of reason  . Cancer Father     Dec Bladder CA age 39  . Heart disease Father   . Hypertension Father   . Hyperlipidemia Father   . Stroke Paternal Grandfather     ROS:  Pertinent items are noted in HPI.  Otherwise, a comprehensive ROS was negative.  Exam:   BP 130/76 mmHg  Pulse 70  Resp 10  Ht 5'  2.5" (1.588 m)  Wt 138 lb 6.4 oz (62.778 kg)  BMI 24.89 kg/m2  LMP 05/04/2002 (Approximate)    General appearance: alert, cooperative and appears stated age Head: Normocephalic, without obvious abnormality, atraumatic Neck: no adenopathy, supple, symmetrical, trachea midline and thyroid normal to inspection and palpation Lungs: clear to auscultation bilaterally Breasts: normal appearance, no masses or tenderness, Inspection negative, No nipple retraction or dimpling, No nipple discharge or bleeding, No axillary or supraclavicular adenopathy, left lateral scar of left breast. Heart: regular rate and rhythm Abdomen: soft, non-tender; bowel sounds normal; no masses,  no organomegaly Extremities: extremities normal, atraumatic, no cyanosis or edema Skin: Skin color, texture, turgor normal. No rashes or  lesions Lymph nodes: Cervical, supraclavicular, and axillary nodes normal. No abnormal inguinal nodes palpated Neurologic: Grossly normal  Pelvic: External genitalia:  no lesions              Urethra:  normal appearing urethra with no masses, tenderness or lesions              Bartholins and Skenes: normal                 Vagina: normal appearing vagina with normal color and discharge, no lesions.  Atrophy noted.               Cervix: no lesions              Pap taken: Yes.   Bimanual Exam:  Uterus:  normal size, contour, position, consistency, mobility, non-tender              Adnexa: normal adnexa and no mass, fullness, tenderness              Rectovaginal: Yes.  .  Confirms.              Anus:  normal sphincter tone, no lesions  Chaperone was present for exam.  Assessment:   Well woman visit with normal exam. Osteoporosis.  Rheumatoid arthritis.  Remote hx of abnormal pap.  Hx salpingitis.  Hx left breast biopsy.   Plan: Yearly mammogram recommended after age 57.  Patient will schedule at [facility].  Recommended self breast exam.  Pap and HR HPV as above. Discussed Calcium, Vitamin D, regular exercise program including cardiovascular and weight bearing exercise. Labs performed.  No..     Refills given on medications.  No..    Discussed signs and symptoms of vaginal atrophy and treatment with H2O based lubricants, oils, and vaginal estrogens.  No Rx at this time. Osteoporosis care through rheumatologist.  Will get records from Dr. Corwin Levins office.  Follow up annually and prn.     After visit summary provided.

## 2015-04-11 ENCOUNTER — Encounter: Payer: Self-pay | Admitting: Obstetrics and Gynecology

## 2015-04-12 LAB — IPS PAP TEST WITH HPV

## 2015-04-15 ENCOUNTER — Encounter: Payer: Self-pay | Admitting: Internal Medicine

## 2015-04-15 ENCOUNTER — Ambulatory Visit
Admission: RE | Admit: 2015-04-15 | Discharge: 2015-04-15 | Disposition: A | Discharge status: Home / Self Care | Payer: BLUE CROSS/BLUE SHIELD | Source: Ambulatory Visit | Attending: Internal Medicine | Admitting: Internal Medicine

## 2015-04-15 ENCOUNTER — Ambulatory Visit (INDEPENDENT_AMBULATORY_CARE_PROVIDER_SITE_OTHER): Payer: BLUE CROSS/BLUE SHIELD | Admitting: Internal Medicine

## 2015-04-15 VITALS — BP 108/70 | HR 92 | Temp 99.3°F | Resp 20 | Ht 62.0 in | Wt 136.0 lb

## 2015-04-15 DIAGNOSIS — Z79899 Other long term (current) drug therapy: Secondary | ICD-10-CM

## 2015-04-15 DIAGNOSIS — Z79620 Long term (current) use of immunosuppressive biologic: Secondary | ICD-10-CM

## 2015-04-15 DIAGNOSIS — H6503 Acute serous otitis media, bilateral: Secondary | ICD-10-CM

## 2015-04-15 DIAGNOSIS — J189 Pneumonia, unspecified organism: Secondary | ICD-10-CM | POA: Diagnosis not present

## 2015-04-15 DIAGNOSIS — R059 Cough, unspecified: Secondary | ICD-10-CM

## 2015-04-15 DIAGNOSIS — R05 Cough: Secondary | ICD-10-CM

## 2015-04-15 DIAGNOSIS — R509 Fever, unspecified: Secondary | ICD-10-CM

## 2015-04-15 DIAGNOSIS — Z8739 Personal history of other diseases of the musculoskeletal system and connective tissue: Secondary | ICD-10-CM | POA: Diagnosis not present

## 2015-04-15 LAB — POCT RAPID STREP A (OFFICE): Rapid Strep A Screen: NEGATIVE

## 2015-04-15 LAB — CBC WITH DIFFERENTIAL/PLATELET
BASOS ABS: 0 10*3/uL (ref 0.0–0.1)
Basophils Relative: 0 % (ref 0–1)
EOS ABS: 0 10*3/uL (ref 0.0–0.7)
EOS PCT: 0 % (ref 0–5)
HEMATOCRIT: 43.8 % (ref 36.0–46.0)
Hemoglobin: 14.5 g/dL (ref 12.0–15.0)
LYMPHS ABS: 1.3 10*3/uL (ref 0.7–4.0)
Lymphocytes Relative: 24 % (ref 12–46)
MCH: 32.7 pg (ref 26.0–34.0)
MCHC: 33.1 g/dL (ref 30.0–36.0)
MCV: 98.9 fL (ref 78.0–100.0)
MONO ABS: 0.7 10*3/uL (ref 0.1–1.0)
MPV: 9.9 fL (ref 8.6–12.4)
Monocytes Relative: 13 % — ABNORMAL HIGH (ref 3–12)
Neutro Abs: 3.5 10*3/uL (ref 1.7–7.7)
Neutrophils Relative %: 63 % (ref 43–77)
PLATELETS: 196 10*3/uL (ref 150–400)
RBC: 4.43 MIL/uL (ref 3.87–5.11)
RDW: 13 % (ref 11.5–15.5)
WBC: 5.5 10*3/uL (ref 4.0–10.5)

## 2015-04-15 MED ORDER — LEVOFLOXACIN 500 MG PO TABS: 500.0000 mg | ORAL_TABLET | Freq: Every day | ORAL | Status: DC

## 2015-04-15 MED ORDER — HYDROCODONE-HOMATROPINE 5-1.5 MG/5ML PO SYRP: 5.0000 mL | ORAL_SOLUTION | Freq: Three times a day (TID) | ORAL | Status: DC | PRN

## 2015-04-15 NOTE — Patient Instructions (Signed)
CBC with differential stat and chest x-ray ordered. Levaquin 500 milligrams daily for 10 days. Hycodan 1 teaspoon by mouth every 8 hours when necessary cough. Alternate Advil 600 mg 3 times daily with Tylenol 650 mg 3 times daily for fever. Call if not better in 48 hours or sooner if worse.

## 2015-04-15 NOTE — Progress Notes (Signed)
   Subjective:    Patient ID: Katrina [name], female    DOB: 1955-07-20, 59 y.o.   MRN: [id number]  HPI Onset Thursday, December 8 of URI symptoms. Over the weekend has developed fever. Maximum temp was 102.8 last evening. Feels bad with fever. Is on long-term Humira for rheumatoid arthritis. Patient has been concerned with fever because in addition to Humira she takes methotrexate. They called rheumatologist today who recommended alternating in so with Tylenol for fever. They almost went to the emergency room last evening but fever broke. Has had cough but little sputum production. Has postnasal drip. No sore throat. No ear pain. No vomiting or diarrhea. No shaking chills.    Review of Systems     Objective:   Physical Exam TMs are full bilaterally but not red. Pharynx very slightly injected. Rapid strep screen is negative. Neck is supple without adenopathy. Chest is clear to auscultation without rales or wheezing       Assessment & Plan:  Fever  Rheumatoid arthritis  Methotrexate and Humira therapy for rheumatoid arthritis  Bilateral serous otitis media  Cough  Plan: CBC with differential and chest x-ray. Levaquin 500 milligrams daily for 10 days. Hycodan 1 teaspoon by mouth every 8 hours when necessary cough  Addendum: CBC with differential is within normal limits. Chest x-ray shows left lower lobe pneumonia. Rest at home and take Levaquin as prescribed and Hycodan as needed for cough. Alternate Tylenol with Advil for fever. Recheck in one week. Drink plenty of fluids.

## 2015-04-18 ENCOUNTER — Telehealth: Payer: Self-pay | Admitting: Internal Medicine

## 2015-04-18 MED ORDER — BENZONATATE 200 MG PO CAPS: 200.0000 mg | ORAL_CAPSULE | Freq: Three times a day (TID) | ORAL | Status: DC | PRN

## 2015-04-18 NOTE — Telephone Encounter (Signed)
Hycodan is upsetting her stomach.  Wants to know if there's an alternative.  She tried Robitussin, but it doesn't really help.  The coughing is the worst at night.  She feels like she needs something to help her sleep since the Hycodan makes her sick and she can't use it.  States that she's not getting any rest at night due to the coughing.  She is sleeping at most 2-3 hours.    Also, wants to know how long she should do the Advil/Tylenol rotation for the fever.  The fever is gone and she tried to stop taking it.  However, when she stopped, states that the fever started creeping back up.  So, she continues to take the Advil/Tylenol.  She wants to know how long she should expect to have to take that.    Said she definitely feels a lot better from the Levaquin, but the cough is just a bear and she can't get any rest from it.    Please advise.

## 2015-04-18 NOTE — Telephone Encounter (Signed)
May need to continue Advil/ Tylenol until fever resolves which could be several days. Don't have to medicate fever unless it affects how you feel. Have nothing else for cough other than Delsym over the counter and/or Tessalon perles.We can call in Tessalon perles 200 mg tid

## 2015-04-18 NOTE — Telephone Encounter (Signed)
Patient notified. Tessalon perles called into pharmacy.  Advised patient to cut back on tylenol and advil to see if fever remains broke.

## 2015-04-22 ENCOUNTER — Ambulatory Visit (INDEPENDENT_AMBULATORY_CARE_PROVIDER_SITE_OTHER): Payer: BLUE CROSS/BLUE SHIELD | Admitting: Internal Medicine

## 2015-04-22 ENCOUNTER — Encounter: Payer: Self-pay | Admitting: Internal Medicine

## 2015-04-22 VITALS — BP 108/68 | HR 70 | Temp 97.7°F | Resp 20 | Ht 62.0 in | Wt 134.0 lb

## 2015-04-22 DIAGNOSIS — J690 Pneumonitis due to inhalation of food and vomit: Secondary | ICD-10-CM | POA: Diagnosis not present

## 2015-04-22 NOTE — Patient Instructions (Signed)
Finish course of Levaquin. Rest and drink plenty of fluids. Return in 3 weeks for Prevnar follow-up and repeat chest x-ray.

## 2015-04-22 NOTE — Progress Notes (Signed)
   Subjective:    Patient ID: Katrina [name], female    DOB: 30-Mar-1956, 59 y.o.   MRN: [id number]  HPI 59 year old Female diagnosed last week with left lower lobe pneumonia treated with Levaquin. She's feeling much better. Has been resting at home. Needs to stay out of work and additional week. Feels very fatigued. History of rheumatoid arthritis treated with Humira And methotrexate. No longer running fever. Cough is improved. Very little sputum production.   Review of Systems     Objective:   Physical Exam  Skin warm and dry. Nodes none. TMs are slightly full but not red. Pharynx is clear. Neck is supple without adenopathy. Chest without wheezing. Fine rales left axilla and left lower lobe.      Assessment & Plan:  Left lower lobe pneumonia-improving  Rheumatoid arthritis treated with methotrexate and Humira. These are on hold for now. Will restart these late next week.  Plan: She'll return in 3 weeks and have repeat chest x-ray at that time. She is due for Prevnar vaccine which will be given then. Has had pneumococcal 23 vaccine. Rest and drink plenty of fluids. Finish course of antibiotics prescribed last week.

## 2015-05-08 ENCOUNTER — Telehealth: Payer: Self-pay

## 2015-05-08 NOTE — Telephone Encounter (Signed)
Patient states that she has completed the antibiotic and all of her medications for the pneumonia. She states that she still feels winded and states that she doesn't feel right. She states that she has been off of her methotrexate and humira. She states that she doesn't feel like she has made any progress or at least not as well as she thinks she should have. She is still exhausted and still has trouble climbing stairs. I encouraged her that it will take time for her to feel 100%. She is scheduled for repeat CXR on Monday 05/13/15 and an OV on 05/14/15. I advised her to contact office is she feels like she needs to be seen sooner. She states that I eased her mind telling her that it will just take time and that she will get better with time.

## 2015-05-13 ENCOUNTER — Ambulatory Visit
Admission: RE | Admit: 2015-05-13 | Discharge: 2015-05-13 | Disposition: A | Discharge status: Home / Self Care | Payer: BLUE CROSS/BLUE SHIELD | Source: Ambulatory Visit | Attending: Internal Medicine | Admitting: Internal Medicine

## 2015-05-13 ENCOUNTER — Encounter: Payer: Self-pay | Admitting: Internal Medicine

## 2015-05-13 ENCOUNTER — Ambulatory Visit (INDEPENDENT_AMBULATORY_CARE_PROVIDER_SITE_OTHER): Payer: BLUE CROSS/BLUE SHIELD | Admitting: Internal Medicine

## 2015-05-13 VITALS — BP 110/68 | HR 69 | Temp 98.7°F | Resp 18 | Ht 62.5 in | Wt 138.0 lb

## 2015-05-13 DIAGNOSIS — J181 Lobar pneumonia, unspecified organism: Secondary | ICD-10-CM

## 2015-05-13 DIAGNOSIS — J189 Pneumonia, unspecified organism: Secondary | ICD-10-CM | POA: Diagnosis not present

## 2015-05-13 DIAGNOSIS — H6691 Otitis media, unspecified, right ear: Secondary | ICD-10-CM | POA: Diagnosis not present

## 2015-05-13 DIAGNOSIS — J690 Pneumonitis due to inhalation of food and vomit: Secondary | ICD-10-CM

## 2015-05-13 DIAGNOSIS — H6091 Unspecified otitis externa, right ear: Secondary | ICD-10-CM | POA: Diagnosis not present

## 2015-05-13 DIAGNOSIS — Z23 Encounter for immunization: Secondary | ICD-10-CM

## 2015-05-13 MED ORDER — CEFTRIAXONE SODIUM 1 G IJ SOLR
1.0000 g | INTRAMUSCULAR | Status: AC
  Administered 2015-05-13: 1 g via INTRAMUSCULAR

## 2015-05-13 MED ORDER — NEOMYCIN-POLYMYXIN-HC 3.5-10000-1 OT SOLN: OTIC | Status: DC

## 2015-05-13 MED ORDER — LEVOFLOXACIN 500 MG PO TABS: 500.0000 mg | ORAL_TABLET | Freq: Every day | ORAL | Status: DC

## 2015-05-13 NOTE — Patient Instructions (Addendum)
Take Levaquin 500 milligrams daily for 7 days. Cortisporin Otic suspension 4 drops in right ear 4 times a day for 5 days. Rocephin 1 g IM given. Prevnar 13 given today.

## 2015-05-13 NOTE — Progress Notes (Signed)
   Subjective:    Patient ID: Katrina [name], female    DOB: [birth year], 60 y.o.   MRN: [id number]  HPI Patient had appointment tomorrow to follow-up on left lower lobe pneumonia with chest x-ray which was done today. Feeling well from the pneumonia. No shortness of breath cough or wheezing. However three-day history of right ear pain. Thought she might have impacted cerumen. Has not been swimming. Says she feels like she has swimmer's ear. Has been using Q-tips in right ear.  She has not started back on Humira for rheumatoid arthritis just yet. Had plan to start later this week. Needs Prevnar 13 which was given today.    Review of Systems     Objective:   Physical Exam Skin warm and dry. Nodes none. Chest clear to auscultation without rales or wheezing. Left TM clear. Right TM is full and red with distorted light reflex. Right external ear canal is also red. No drainage noted. Chest x-ray shows resolution of left lower lobe pneumonia.       Assessment & Plan:  Acute right otitis media  Acute right otitis externa  Left lower lobe pneumonia-community-acquired-resolved  History of rheumatoid arthritis treated with Humira  Plan: Levaquin 500 milligrams daily for 7 days. Cortisporin Otic suspension 4 drops in right ear canal 4 times a day for 5 days. Rocephin 1 g IM given in office. Prevnar 13 given today. Wait until next week to start Humira.

## 2015-05-14 ENCOUNTER — Ambulatory Visit: Payer: BLUE CROSS/BLUE SHIELD | Admitting: Internal Medicine

## 2015-05-14 LAB — LAB REPORT - SCANNED: HM Hepatitis Screen: NEGATIVE

## 2015-05-16 ENCOUNTER — Telehealth: Payer: Self-pay

## 2015-05-16 NOTE — Telephone Encounter (Signed)
Patient states that she is still having a full feeling in her ear. She states that it doesn't feel like much has changed despite the drops, shot and antibiotics that she is still taking. She is asking should she be taking a sudafed or nasal spray to help eluviate some of the pressure.

## 2015-05-16 NOTE — Telephone Encounter (Signed)
She may take Sudafed. This could take several days

## 2015-05-16 NOTE — Telephone Encounter (Signed)
Patient notified

## 2015-05-17 ENCOUNTER — Other Ambulatory Visit: Payer: Self-pay

## 2015-05-17 DIAGNOSIS — Z1231 Encounter for screening mammogram for malignant neoplasm of breast: Secondary | ICD-10-CM

## 2015-05-20 ENCOUNTER — Telehealth: Payer: Self-pay

## 2015-05-20 NOTE — Telephone Encounter (Signed)
Patient was notified and states that she will call her ENT dr to see if they can see her as she is scheduled to fly next Wednesday and doesn't want to do so with her ear the way it is.

## 2015-05-20 NOTE — Telephone Encounter (Signed)
Patient states that she has finished the ABX and the ear drops and her ear is still plugged feeling. Please advise.

## 2015-05-20 NOTE — Telephone Encounter (Signed)
Let's refer her to ENT of her choice

## 2015-05-24 ENCOUNTER — Encounter: Payer: Self-pay | Admitting: Obstetrics and Gynecology

## 2015-06-06 ENCOUNTER — Encounter: Payer: BLUE CROSS/BLUE SHIELD | Admitting: Internal Medicine

## 2015-06-07 ENCOUNTER — Other Ambulatory Visit: Payer: BLUE CROSS/BLUE SHIELD | Admitting: Internal Medicine

## 2015-06-07 DIAGNOSIS — Z1329 Encounter for screening for other suspected endocrine disorder: Secondary | ICD-10-CM

## 2015-06-07 DIAGNOSIS — Z13220 Encounter for screening for lipoid disorders: Secondary | ICD-10-CM

## 2015-06-07 DIAGNOSIS — Z130 Encounter for screening for diseases of the blood and blood-forming organs and certain disorders involving the immune mechanism: Secondary | ICD-10-CM

## 2015-06-07 DIAGNOSIS — Z1321 Encounter for screening for nutritional disorder: Secondary | ICD-10-CM

## 2015-06-07 DIAGNOSIS — Z0000 Encounter for general adult medical examination without abnormal findings: Secondary | ICD-10-CM

## 2015-06-07 LAB — CBC WITH DIFFERENTIAL/PLATELET
BASOS PCT: 1 % (ref 0–1)
Basophils Absolute: 0.1 10*3/uL (ref 0.0–0.1)
EOS ABS: 0.2 10*3/uL (ref 0.0–0.7)
EOS PCT: 4 % (ref 0–5)
HCT: 43.8 % (ref 36.0–46.0)
Hemoglobin: 14.8 g/dL (ref 12.0–15.0)
Lymphocytes Relative: 45 % (ref 12–46)
Lymphs Abs: 2.7 10*3/uL (ref 0.7–4.0)
MCH: 32.1 pg (ref 26.0–34.0)
MCHC: 33.8 g/dL (ref 30.0–36.0)
MCV: 95 fL (ref 78.0–100.0)
MONO ABS: 0.5 10*3/uL (ref 0.1–1.0)
MPV: 9.1 fL (ref 8.6–12.4)
Monocytes Relative: 8 % (ref 3–12)
Neutro Abs: 2.5 10*3/uL (ref 1.7–7.7)
Neutrophils Relative %: 42 % — ABNORMAL LOW (ref 43–77)
Platelets: 267 10*3/uL (ref 150–400)
RBC: 4.61 MIL/uL (ref 3.87–5.11)
RDW: 13.1 % (ref 11.5–15.5)
WBC: 5.9 10*3/uL (ref 4.0–10.5)

## 2015-06-07 LAB — LIPID PANEL
CHOL/HDL RATIO: 2.2 ratio (ref <=5.0)
CHOLESTEROL: 186 mg/dL (ref 125–200)
HDL: 84 mg/dL (ref >=46)
LDL Cholesterol: 90 mg/dL (ref <130)
Triglycerides: 61 mg/dL (ref <150)
VLDL: 12 mg/dL (ref <30)

## 2015-06-07 LAB — COMPLETE METABOLIC PANEL WITH GFR
ALT: 17 U/L (ref 6–29)
AST: 20 U/L (ref 10–35)
Albumin: 3.9 g/dL (ref 3.6–5.1)
Alkaline Phosphatase: 72 U/L (ref 33–130)
BUN: 15 mg/dL (ref 7–25)
CALCIUM: 9 mg/dL (ref 8.6–10.4)
CHLORIDE: 104 mmol/L (ref 98–110)
CO2: 27 mmol/L (ref 20–31)
Creat: 0.63 mg/dL (ref 0.50–0.99)
GFR, Est African American: >89 mL/min (ref >=60)
GFR, Est Non African American: >89 mL/min (ref >=60)
GLUCOSE: 88 mg/dL (ref 65–99)
POTASSIUM: 4.5 mmol/L (ref 3.5–5.3)
SODIUM: 141 mmol/L (ref 135–146)
Total Bilirubin: 0.6 mg/dL (ref 0.2–1.2)
Total Protein: 6.9 g/dL (ref 6.1–8.1)

## 2015-06-07 LAB — TSH: TSH: 1.987 u[IU]/mL (ref 0.350–4.500)

## 2015-06-08 LAB — VITAMIN D 25 HYDROXY (VIT D DEFICIENCY, FRACTURES): VIT D 25 HYDROXY: 47 ng/mL (ref 30–100)

## 2015-06-13 ENCOUNTER — Ambulatory Visit (INDEPENDENT_AMBULATORY_CARE_PROVIDER_SITE_OTHER): Payer: BLUE CROSS/BLUE SHIELD | Admitting: Internal Medicine

## 2015-06-13 ENCOUNTER — Encounter: Payer: Self-pay | Admitting: Internal Medicine

## 2015-06-13 ENCOUNTER — Ambulatory Visit
Admission: RE | Admit: 2015-06-13 | Discharge: 2015-06-13 | Disposition: A | Discharge status: Home / Self Care | Payer: BLUE CROSS/BLUE SHIELD | Source: Ambulatory Visit

## 2015-06-13 VITALS — BP 122/68 | HR 67 | Temp 98.3°F | Resp 20 | Ht 63.0 in | Wt 137.0 lb

## 2015-06-13 DIAGNOSIS — Z0000 Encounter for general adult medical examination without abnormal findings: Secondary | ICD-10-CM

## 2015-06-13 DIAGNOSIS — J189 Pneumonia, unspecified organism: Secondary | ICD-10-CM

## 2015-06-13 DIAGNOSIS — Z1231 Encounter for screening mammogram for malignant neoplasm of breast: Secondary | ICD-10-CM

## 2015-06-13 DIAGNOSIS — M059 Rheumatoid arthritis with rheumatoid factor, unspecified: Secondary | ICD-10-CM | POA: Diagnosis not present

## 2015-06-13 DIAGNOSIS — J181 Lobar pneumonia, unspecified organism: Secondary | ICD-10-CM

## 2015-06-13 NOTE — Progress Notes (Signed)
Subjective:    Patient ID: Katrina [name], female    DOB: 1956-01-19, 60 y.o.   MRN: [id number]  HPI 60 year old White Female with history  of Rheumatoid arthritis being treated and seen recently by Dr. Dierdre Forth. Recently seen and treated here for left lower lobe pneumonia. Feeling much better and has returned to work. She is on Humira and does well. She also takes methotrexate. 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 Was diagnosed by Dr. Jimmy Footman of [facility] with rheumatoid arthritis several years ago transferred to Dr. Dierdre Forth after Dr. Jimmy Footman left practice.  She has GYN physician, Dr. Wyvonnia Lora, seen in 2016.  She used to see Dr. Chevis Pretty but he retired.  Past medical history: History of fractured third metatarsal 2008. History of right trigger finger release 2008. Appendectomy 1976. Colonoscopy in 2007 and again in 2015 with 10 year follow-up recommended. She has handled flu vaccine. Had Pneumovax 23 February 2009. Tetanus immunization given December 2011.  Tonsillectomy in 2014 after being diagnosed with peritonsillar abscess. This was done by Dr. Narda Bonds.  No known drug allergies.  Social history: Does not smoke or consume alcohol. 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 She and her husband has been considerable time sailing.  Family history: Mother died with lung cancer. Father with history of hypertension died at age 20 with history of congestive heart failure, bladder cancer, macular degeneration.    Review of Systems  Constitutional: Negative.   HENT: Negative.   Eyes: Negative.   Respiratory:       Left lower lobe pneumonia resolved. No cough.  Cardiovascular: Negative.   Gastrointestinal: Negative.   Musculoskeletal:       Joint pain  Psychiatric/Behavioral: Negative.        Objective:   Physical Exam  Constitutional: She  is oriented to person, place, and time. She appears well-developed and well-nourished. No distress.  HENT:  Head: Normocephalic and atraumatic.  Right Ear: External ear normal.  Left Ear: External ear normal.  Mouth/Throat: Oropharynx is clear and moist. No oropharyngeal exudate.  Eyes: Conjunctivae and EOM are normal. Pupils are equal, round, and reactive to light. Right eye exhibits no discharge. Left eye exhibits no discharge. No scleral icterus.  Neck: Neck supple. No JVD present. No thyromegaly present.  Cardiovascular: Normal rate, regular rhythm and normal heart sounds.   No murmur heard. Pulmonary/Chest: Effort normal and breath sounds normal. No respiratory distress. She has no wheezes. She has no rales. She exhibits no tenderness.  Breasts normal female without masses  Abdominal: Soft. Bowel sounds are normal. She exhibits no distension and no mass. There is no tenderness. There is no rebound and no guarding.  Genitourinary:  Deferred to GYN physician  Musculoskeletal: She exhibits no edema.  Lymphadenopathy:    She has no cervical adenopathy.  Neurological: She is alert and oriented to person, place, and time. She has normal reflexes. No cranial nerve deficit.  Skin: Skin is warm and dry. No rash noted. She is not diaphoretic.  Psychiatric: She has a normal mood and affect. Her behavior is normal. Judgment and thought content normal.  Vitals reviewed.         Assessment & Plan:  Recent bout of left lower lobe pneumonia-resolved  Rheumatoid arthritis-stable and treated by rheumatologist  History of peritonsillar abscess status post tonsillectomy 2014  Menopausal  Plan: Return in one year or  as needed. Lab work reviewed and is within normal limits. Recommend annual mammogram.

## 2015-07-02 NOTE — Patient Instructions (Addendum)
It was a pleasure to see you today. Lab work is normal. Return in one year or as needed. Prevnar given January 2017

## 2015-08-08 DIAGNOSIS — M0579 Rheumatoid arthritis with rheumatoid factor of multiple sites without organ or systems involvement: Secondary | ICD-10-CM | POA: Diagnosis not present

## 2015-08-08 DIAGNOSIS — M810 Age-related osteoporosis without current pathological fracture: Secondary | ICD-10-CM | POA: Diagnosis not present

## 2015-08-08 DIAGNOSIS — M150 Primary generalized (osteo)arthritis: Secondary | ICD-10-CM | POA: Diagnosis not present

## 2015-10-29 DIAGNOSIS — H43811 Vitreous degeneration, right eye: Secondary | ICD-10-CM | POA: Diagnosis not present

## 2015-11-07 DIAGNOSIS — M0579 Rheumatoid arthritis with rheumatoid factor of multiple sites without organ or systems involvement: Secondary | ICD-10-CM | POA: Diagnosis not present

## 2015-11-12 DIAGNOSIS — D485 Neoplasm of uncertain behavior of skin: Secondary | ICD-10-CM | POA: Diagnosis not present

## 2015-11-12 DIAGNOSIS — L820 Inflamed seborrheic keratosis: Secondary | ICD-10-CM | POA: Diagnosis not present

## 2015-11-18 DIAGNOSIS — M722 Plantar fascial fibromatosis: Secondary | ICD-10-CM | POA: Diagnosis not present

## 2015-11-29 DIAGNOSIS — H43811 Vitreous degeneration, right eye: Secondary | ICD-10-CM | POA: Diagnosis not present

## 2015-12-03 DIAGNOSIS — M0579 Rheumatoid arthritis with rheumatoid factor of multiple sites without organ or systems involvement: Secondary | ICD-10-CM | POA: Diagnosis not present

## 2015-12-03 DIAGNOSIS — M810 Age-related osteoporosis without current pathological fracture: Secondary | ICD-10-CM | POA: Diagnosis not present

## 2015-12-03 DIAGNOSIS — M150 Primary generalized (osteo)arthritis: Secondary | ICD-10-CM | POA: Diagnosis not present

## 2015-12-03 DIAGNOSIS — M79671 Pain in right foot: Secondary | ICD-10-CM | POA: Diagnosis not present

## 2016-02-05 DIAGNOSIS — M0579 Rheumatoid arthritis with rheumatoid factor of multiple sites without organ or systems involvement: Secondary | ICD-10-CM | POA: Diagnosis not present

## 2016-02-05 DIAGNOSIS — M810 Age-related osteoporosis without current pathological fracture: Secondary | ICD-10-CM | POA: Diagnosis not present

## 2016-02-05 DIAGNOSIS — M79671 Pain in right foot: Secondary | ICD-10-CM | POA: Diagnosis not present

## 2016-02-05 DIAGNOSIS — M150 Primary generalized (osteo)arthritis: Secondary | ICD-10-CM | POA: Diagnosis not present

## 2016-02-25 ENCOUNTER — Ambulatory Visit (INDEPENDENT_AMBULATORY_CARE_PROVIDER_SITE_OTHER): Payer: BLUE CROSS/BLUE SHIELD | Admitting: Internal Medicine

## 2016-02-25 VITALS — BP 100/70 | Temp 97.6°F

## 2016-02-25 DIAGNOSIS — Z23 Encounter for immunization: Secondary | ICD-10-CM

## 2016-02-25 NOTE — Progress Notes (Signed)
Flu shot given, handled well.  

## 2016-04-17 DIAGNOSIS — H16203 Unspecified keratoconjunctivitis, bilateral: Secondary | ICD-10-CM | POA: Diagnosis not present

## 2016-04-22 DIAGNOSIS — D485 Neoplasm of uncertain behavior of skin: Secondary | ICD-10-CM | POA: Diagnosis not present

## 2016-04-22 DIAGNOSIS — L570 Actinic keratosis: Secondary | ICD-10-CM | POA: Diagnosis not present

## 2016-04-22 DIAGNOSIS — L821 Other seborrheic keratosis: Secondary | ICD-10-CM | POA: Diagnosis not present

## 2016-04-23 NOTE — Progress Notes (Signed)
60 y.o. G15P0010 Married Caucasian female here for annual exam.    Hx osteoporosis and Fosamax use and stopped.   Has rheumatoid arthritis.  On methotrexate and Humira.  No steroid use.  Would like to do her bone care through her rheumatologist.   Got her flu shot this year.   PCP:   Marlan Palau, MD, Dr. Mallie Mussel  Patient's last menstrual period was 05/04/2002 (approximate).           Sexually active: No.   Some dryness issues.  The current method of family planning is post menopausal status.    Exercising: Yes.    yoga, cardio/eliptical.  Goes to the [facility]. Smoker:  Former  Health Maintenance: Pap:  04-10-15 Neg:Neg HR HPV History of abnormal Pap:  Yes, In her 30's had colposcopy but no treatment to cervix--repeat pap normal. MMG:  06-13-15 3D/Density C/Neg/BiRads1:TBC Colonoscopy:  04-19-14 normal in [place];next due 04/2024.  BMD: 04-05-14  Result  Osteoporosis;done in Dr.Mezer's office TDaP:  04-17-10 Gardasil:   N/A Hep C:  Neg in past prior to MTX. Screening Labs:  Hb today: PCP, Urine today: PCP   reports that she quit smoking about 25 years ago. Her smoking use included Cigarettes. She smoked 1.00 pack per day. She has never used smokeless tobacco. She reports that she does not drink alcohol or use drugs.  Past Medical History:  Diagnosis Date  . Arthritis    --rheumatoid  . Osteoporosis 04/05/14   T score -2.6 spine  . Salpingitis    Had appendectomy at same time as salpingitis dx was made.    Past Surgical History:  Procedure Laterality Date  . APPENDECTOMY    . left breast biopsy    . TONSILLECTOMY AND ADENOIDECTOMY  2014    Current Outpatient Prescriptions  Medication Sig Dispense Refill  . calcium carbonate (OS-CAL) 600 MG TABS Take 600 mg by mouth 2 (two) times daily with a meal.      . cholecalciferol (VITAMIN D) 1000 UNITS tablet Take by mouth daily.      . fish oil-omega-3 fatty acids 1000 MG capsule Take 2 g by mouth daily.      . folic acid (FOLVITE)  1 MG tablet Take 1 mg by mouth daily.      Marland Kitchen HUMIRA PEN 40 MG/0.8ML PNKT   3  . methotrexate (RHEUMATREX) 2.5 MG tablet Take 15 mg by mouth once a week. Caution:Chemotherapy. Protect from light.    . vitamin C (ASCORBIC ACID) 250 MG tablet Take 250 mg by mouth daily.     No current facility-administered medications for this visit.     Family History  Problem Relation Age of Onset  . Cancer Mother 72    Dec Lung CA  . Thyroid disease Mother     thyroidectomy in her 20's--unsure of reason  . Cancer Father     Dec Bladder CA age 32  . Heart disease Father   . Hypertension Father   . Hyperlipidemia Father   . Stroke Paternal Grandfather     ROS:  Pertinent items are noted in HPI.  Otherwise, a comprehensive ROS was negative.  Exam:   BP 110/60 (BP Location: Right Arm, Patient Position: Sitting, Cuff Size: Normal)   Pulse 80   Resp 16   Ht 5' 2.25" (1.581 m)   Wt 137 lb 9.6 oz (62.4 kg)   LMP 05/04/2002 (Approximate)   BMI 24.97 kg/m     General appearance: alert, cooperative and appears stated age  Head: Normocephalic, without obvious abnormality, atraumatic Neck: no adenopathy, supple, symmetrical, trachea midline and thyroid normal to inspection and palpation Lungs: clear to auscultation bilaterally Breasts: left breast with left lateral scar, and right breast with normal appearance.  Both breasts with no masses or tenderness, No nipple retraction or dimpling, No nipple discharge or bleeding, No axillary or supraclavicular adenopathy. Heart: regular rate and rhythm Abdomen: soft, non-tender; no masses, no organomegaly Extremities: extremities normal, atraumatic, no cyanosis or edema Skin: Skin color, texture, turgor normal. No rashes or lesions Lymph nodes: Cervical, supraclavicular, and axillary nodes normal. No abnormal inguinal nodes palpated Neurologic: Grossly normal  Pelvic: External genitalia:  no lesions              Urethra:  normal appearing urethra with no  masses, tenderness or lesions              Bartholins and Skenes: normal                 Vagina: normal appearing vagina with normal color and discharge, no lesions              Cervix: no lesions              Pap taken: No. Bimanual Exam:  Uterus:  normal size, contour, position, consistency, mobility, non-tender              Adnexa: no mass, fullness, tenderness              Rectal exam: Yes.  .  Confirms.              Anus:  normal sphincter tone, no lesions  Chaperone was present for exam.  Assessment:   Well woman visit with normal exam. Osteoporosis.  Rheumatoid arthritis.  Remote hx of abnormal pap.  Hx salpingitis.  Hx left breast biopsy.   Plan: Mammogram screening discussed. Recommended self breast awareness. Pap and HR HPV as above. Guidelines for Calcium, Vitamin D, regular exercise program including cardiovascular and weight bearing exercise. She will follow up with her rheumatologist to have him order her next bone density.   Follow up annually and prn.      After visit summary provided.

## 2016-04-24 ENCOUNTER — Encounter: Payer: Self-pay | Admitting: Obstetrics and Gynecology

## 2016-04-24 ENCOUNTER — Ambulatory Visit (INDEPENDENT_AMBULATORY_CARE_PROVIDER_SITE_OTHER): Payer: BLUE CROSS/BLUE SHIELD | Admitting: Obstetrics and Gynecology

## 2016-04-24 VITALS — BP 110/60 | HR 80 | Resp 16 | Ht 62.25 in | Wt 137.6 lb

## 2016-04-24 DIAGNOSIS — Z01419 Encounter for gynecological examination (general) (routine) without abnormal findings: Secondary | ICD-10-CM | POA: Diagnosis not present

## 2016-04-24 NOTE — Patient Instructions (Signed)

## 2016-05-06 DIAGNOSIS — J310 Chronic rhinitis: Secondary | ICD-10-CM | POA: Diagnosis not present

## 2016-05-07 DIAGNOSIS — M0579 Rheumatoid arthritis with rheumatoid factor of multiple sites without organ or systems involvement: Secondary | ICD-10-CM | POA: Diagnosis not present

## 2016-05-13 ENCOUNTER — Ambulatory Visit: Payer: BLUE CROSS/BLUE SHIELD | Admitting: Obstetrics and Gynecology

## 2016-05-14 ENCOUNTER — Other Ambulatory Visit: Payer: Self-pay | Admitting: Internal Medicine

## 2016-05-14 DIAGNOSIS — Z1231 Encounter for screening mammogram for malignant neoplasm of breast: Secondary | ICD-10-CM

## 2016-06-04 ENCOUNTER — Other Ambulatory Visit: Payer: BLUE CROSS/BLUE SHIELD | Admitting: Internal Medicine

## 2016-06-04 DIAGNOSIS — Z13220 Encounter for screening for lipoid disorders: Secondary | ICD-10-CM | POA: Diagnosis not present

## 2016-06-04 DIAGNOSIS — Z1329 Encounter for screening for other suspected endocrine disorder: Secondary | ICD-10-CM | POA: Diagnosis not present

## 2016-06-04 DIAGNOSIS — Z1321 Encounter for screening for nutritional disorder: Secondary | ICD-10-CM

## 2016-06-04 DIAGNOSIS — Z0000 Encounter for general adult medical examination without abnormal findings: Secondary | ICD-10-CM | POA: Diagnosis not present

## 2016-06-04 LAB — COMPREHENSIVE METABOLIC PANEL
ALT: 21 U/L (ref 6–29)
AST: 21 U/L (ref 10–35)
Albumin: 4 g/dL (ref 3.6–5.1)
Alkaline Phosphatase: 75 U/L (ref 33–130)
BUN: 15 mg/dL (ref 7–25)
CALCIUM: 8.9 mg/dL (ref 8.6–10.4)
CO2: 24 mmol/L (ref 20–31)
Chloride: 107 mmol/L (ref 98–110)
Creat: 0.77 mg/dL (ref 0.50–0.99)
GLUCOSE: 91 mg/dL (ref 65–99)
POTASSIUM: 4.4 mmol/L (ref 3.5–5.3)
Sodium: 140 mmol/L (ref 135–146)
Total Bilirubin: 0.5 mg/dL (ref 0.2–1.2)
Total Protein: 6.7 g/dL (ref 6.1–8.1)

## 2016-06-04 LAB — CBC WITH DIFFERENTIAL/PLATELET
Basophils Absolute: 61 cells/uL (ref 0–200)
Basophils Relative: 1 %
Eosinophils Absolute: 244 cells/uL (ref 15–500)
Eosinophils Relative: 4 %
HEMATOCRIT: 43.1 % (ref 35.0–45.0)
Hemoglobin: 14.5 g/dL (ref 11.7–15.5)
LYMPHS PCT: 43 %
Lymphs Abs: 2623 cells/uL (ref 850–3900)
MCH: 32.7 pg (ref 27.0–33.0)
MCHC: 33.6 g/dL (ref 32.0–36.0)
MCV: 97.1 fL (ref 80.0–100.0)
MONO ABS: 488 {cells}/uL (ref 200–950)
MONOS PCT: 8 %
MPV: 9 fL (ref 7.5–12.5)
NEUTROS PCT: 44 %
Neutro Abs: 2684 cells/uL (ref 1500–7800)
PLATELETS: 258 10*3/uL (ref 140–400)
RBC: 4.44 MIL/uL (ref 3.80–5.10)
RDW: 13.6 % (ref 11.0–15.0)
WBC: 6.1 10*3/uL (ref 3.8–10.8)

## 2016-06-04 LAB — LIPID PANEL
CHOL/HDL RATIO: 1.9 ratio (ref <5.0)
Cholesterol: 170 mg/dL (ref <200)
HDL: 91 mg/dL (ref >50)
LDL CALC: 68 mg/dL (ref <100)
Triglycerides: 57 mg/dL (ref <150)
VLDL: 11 mg/dL (ref <30)

## 2016-06-04 LAB — TSH: TSH: 2.69 m[IU]/L

## 2016-06-05 LAB — VITAMIN D 25 HYDROXY (VIT D DEFICIENCY, FRACTURES): VIT D 25 HYDROXY: 50 ng/mL (ref 30–100)

## 2016-06-08 ENCOUNTER — Encounter: Payer: BLUE CROSS/BLUE SHIELD | Admitting: Internal Medicine

## 2016-06-18 ENCOUNTER — Ambulatory Visit: Payer: BLUE CROSS/BLUE SHIELD

## 2016-07-07 ENCOUNTER — Ambulatory Visit: Payer: BLUE CROSS/BLUE SHIELD

## 2016-07-16 ENCOUNTER — Ambulatory Visit
Admission: RE | Admit: 2016-07-16 | Discharge: 2016-07-16 | Disposition: A | Discharge status: Home / Self Care | Payer: BLUE CROSS/BLUE SHIELD | Source: Ambulatory Visit | Attending: Internal Medicine | Admitting: Internal Medicine

## 2016-07-16 DIAGNOSIS — Z1231 Encounter for screening mammogram for malignant neoplasm of breast: Secondary | ICD-10-CM

## 2016-07-17 ENCOUNTER — Encounter: Payer: Self-pay | Admitting: Internal Medicine

## 2016-07-17 ENCOUNTER — Ambulatory Visit (INDEPENDENT_AMBULATORY_CARE_PROVIDER_SITE_OTHER): Payer: BLUE CROSS/BLUE SHIELD | Admitting: Internal Medicine

## 2016-07-17 VITALS — BP 100/72 | HR 72 | Temp 98.4°F | Resp 18 | Ht 62.0 in | Wt 138.0 lb

## 2016-07-17 DIAGNOSIS — Z79620 Long term (current) use of immunosuppressive biologic: Secondary | ICD-10-CM

## 2016-07-17 DIAGNOSIS — Z0000 Encounter for general adult medical examination without abnormal findings: Secondary | ICD-10-CM | POA: Diagnosis not present

## 2016-07-17 DIAGNOSIS — M810 Age-related osteoporosis without current pathological fracture: Secondary | ICD-10-CM

## 2016-07-17 DIAGNOSIS — M059 Rheumatoid arthritis with rheumatoid factor, unspecified: Secondary | ICD-10-CM

## 2016-07-17 DIAGNOSIS — Z79899 Other long term (current) drug therapy: Secondary | ICD-10-CM

## 2016-07-17 MED ORDER — MUPIROCIN 2 % EX OINT: 1.0000 "application " | TOPICAL_OINTMENT | Freq: Two times a day (BID) | CUTANEOUS | 3 refills | Status: DC

## 2016-07-17 NOTE — Progress Notes (Signed)
Subjective:    Patient ID: Katrina [name], female    DOB: 02/05/1956, 61 y.o.   MRN: [id number]  HPI   61 year old Female With history of rheumatoid arthritis treated by Dr. Dierdre Forth, rheumatologist for health maintenance exam and evaluation of medical issues.  In 2017 she had left lower lobe pneumonia. She is on Humira and does well with regard to rheumatoid arthritis symptoms. She also takes methotrexate.  History of strongly positive CCP antibody. 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 She was diagnosed by Dr.Zieminski of [facility] with rheumatoid arthritis several years ago and transferred to Dr. Dierdre Forth after Dr. Jimmy Footman left practice.  GYN physician is Dr. Wyvonnia Lora.  History of osteoporosis on bone density study done by Dr. Chevis Pretty at his office in 2015. T score was -2.6 in the spine and -2.1 in the left and right femurs. She will have another bone density study in the near future and rheumatologist will need to determine what treatment is reasonable.  Past medical history: History of fractured third metatarsal 2008. History of right trigger finger release 2008. Appendectomy 1976. Colonoscopy 2007 and again in 2015 with 10 year follow-up recommended. Pneumovax 02/23/2009. Tetanus immunization given December 2011. Prevnar January 2017.  Tonsillectomy in 2014 after being diagnosed with a. Tonsillar abscess. Surgery was done by Dr. Narda Bonds for that.  No known drug allergies.  Social history: She does not smoke or consume alcohol. 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 No children.  Family history: Mother died with lung cancer. Father with history of hypertension died at age 57 with history of congestive heart failure, bladder cancer, macular degeneration.       Review of Systems  Constitutional: Negative.   Respiratory:  Negative.   Cardiovascular: Negative.   Gastrointestinal: Negative.   Genitourinary: Negative.   Psychiatric/Behavioral: Negative.        Objective:   Physical Exam  Constitutional: She is oriented to person, place, and time. She appears well-developed and well-nourished. No distress.  HENT:  Head: Normocephalic and atraumatic.  Right Ear: External ear normal.  Left Ear: External ear normal.  Mouth/Throat: Oropharynx is clear and moist. No oropharyngeal exudate.  Eyes: Conjunctivae and EOM are normal. Pupils are equal, round, and reactive to light. Right eye exhibits no discharge. Left eye exhibits no discharge. No scleral icterus.  Neck: Neck supple. No JVD present. No thyromegaly present.  Cardiovascular: Normal rate, regular rhythm, normal heart sounds and intact distal pulses.   No murmur heard. Pulmonary/Chest: Effort normal and breath sounds normal. No respiratory distress. She has no wheezes. She has no rales.  Abdominal: Soft. Bowel sounds are normal. She exhibits no distension. There is no tenderness. There is no rebound and no guarding.  Genitourinary:  Genitourinary Comments: Deferred to GYN  Musculoskeletal: She exhibits no edema.  Joints are amazingly normal in appearance without significant effusions or swelling. No redness.  Lymphadenopathy:    She has no cervical adenopathy.  Neurological: She is alert and oriented to person, place, and time. She has normal reflexes. No cranial nerve deficit. Coordination normal.  Skin: Skin is warm and dry. No rash noted. She is not diaphoretic.  Psychiatric: She has a normal mood and affect. Her behavior is normal. Judgment and thought content normal.  Vitals reviewed.         Assessment & Plan:  Rheumatoid  arthritis-stable on methotrexate and Humira  History of left lower lobe pneumonia 2017  History of peritonsillar abscess status post tonsillectomy 2014  Osteoporosis on bone density study by GYN 2015. Have repeat  study in the near future.  Plan: Lab work reviewed with her and is entirely within normal limits. Return in one year or as needed.

## 2016-07-22 DIAGNOSIS — H40033 Anatomical narrow angle, bilateral: Secondary | ICD-10-CM | POA: Diagnosis not present

## 2016-07-22 NOTE — Patient Instructions (Signed)
It was pleasure to see you today. Continue same medications and return in one year or as needed. Have bone density study in the near future.

## 2016-07-29 ENCOUNTER — Ambulatory Visit
Admission: RE | Admit: 2016-07-29 | Discharge: 2016-07-29 | Disposition: A | Discharge status: Home / Self Care | Payer: BLUE CROSS/BLUE SHIELD | Source: Ambulatory Visit | Attending: Internal Medicine | Admitting: Internal Medicine

## 2016-07-29 DIAGNOSIS — M810 Age-related osteoporosis without current pathological fracture: Secondary | ICD-10-CM

## 2016-07-29 DIAGNOSIS — Z780 Asymptomatic menopausal state: Secondary | ICD-10-CM | POA: Diagnosis not present

## 2016-08-12 DIAGNOSIS — M0579 Rheumatoid arthritis with rheumatoid factor of multiple sites without organ or systems involvement: Secondary | ICD-10-CM | POA: Diagnosis not present

## 2016-08-12 DIAGNOSIS — M810 Age-related osteoporosis without current pathological fracture: Secondary | ICD-10-CM | POA: Diagnosis not present

## 2016-08-12 DIAGNOSIS — M150 Primary generalized (osteo)arthritis: Secondary | ICD-10-CM | POA: Diagnosis not present

## 2016-08-12 DIAGNOSIS — M79671 Pain in right foot: Secondary | ICD-10-CM | POA: Diagnosis not present

## 2016-08-27 DIAGNOSIS — D225 Melanocytic nevi of trunk: Secondary | ICD-10-CM | POA: Diagnosis not present

## 2016-08-27 DIAGNOSIS — L821 Other seborrheic keratosis: Secondary | ICD-10-CM | POA: Diagnosis not present

## 2016-08-27 DIAGNOSIS — D485 Neoplasm of uncertain behavior of skin: Secondary | ICD-10-CM | POA: Diagnosis not present

## 2016-08-27 DIAGNOSIS — Z86018 Personal history of other benign neoplasm: Secondary | ICD-10-CM | POA: Diagnosis not present

## 2016-08-27 DIAGNOSIS — L820 Inflamed seborrheic keratosis: Secondary | ICD-10-CM | POA: Diagnosis not present

## 2016-08-27 DIAGNOSIS — D2261 Melanocytic nevi of right upper limb, including shoulder: Secondary | ICD-10-CM | POA: Diagnosis not present

## 2016-10-13 IMAGING — CR DG CHEST 2V
2 series · 2 of 2 positions shown · non-contrast
Comparison: Radiographs 07/14/2005.  CT 02/06/2005.

ADDENDUM:
There is a voice recognition error in the clinical data section. The
patient is 59 years old.
CLINICAL DATA: 5-year-old with cough and fever for 4 days.
Rheumatoid arthritis with immunosuppressive medications.

EXAM:
CHEST  2 VIEW

[w chest pa]
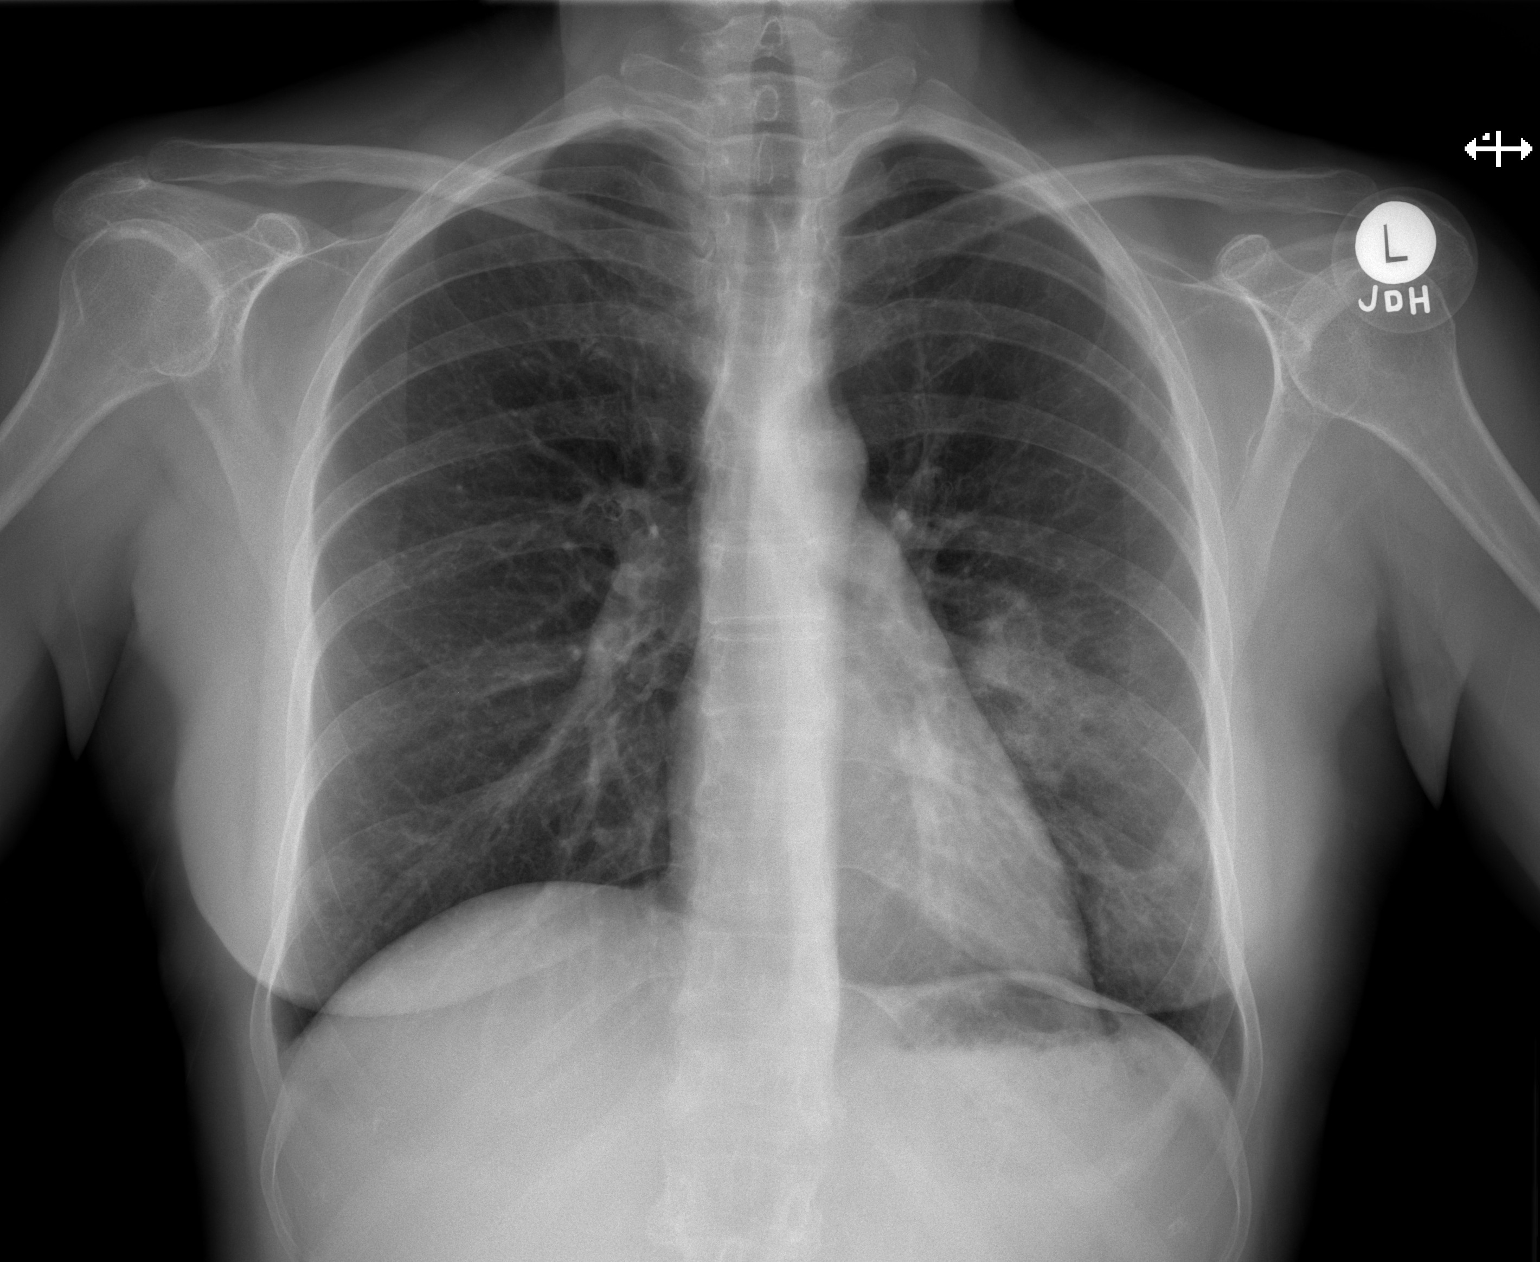

[w chest lat]
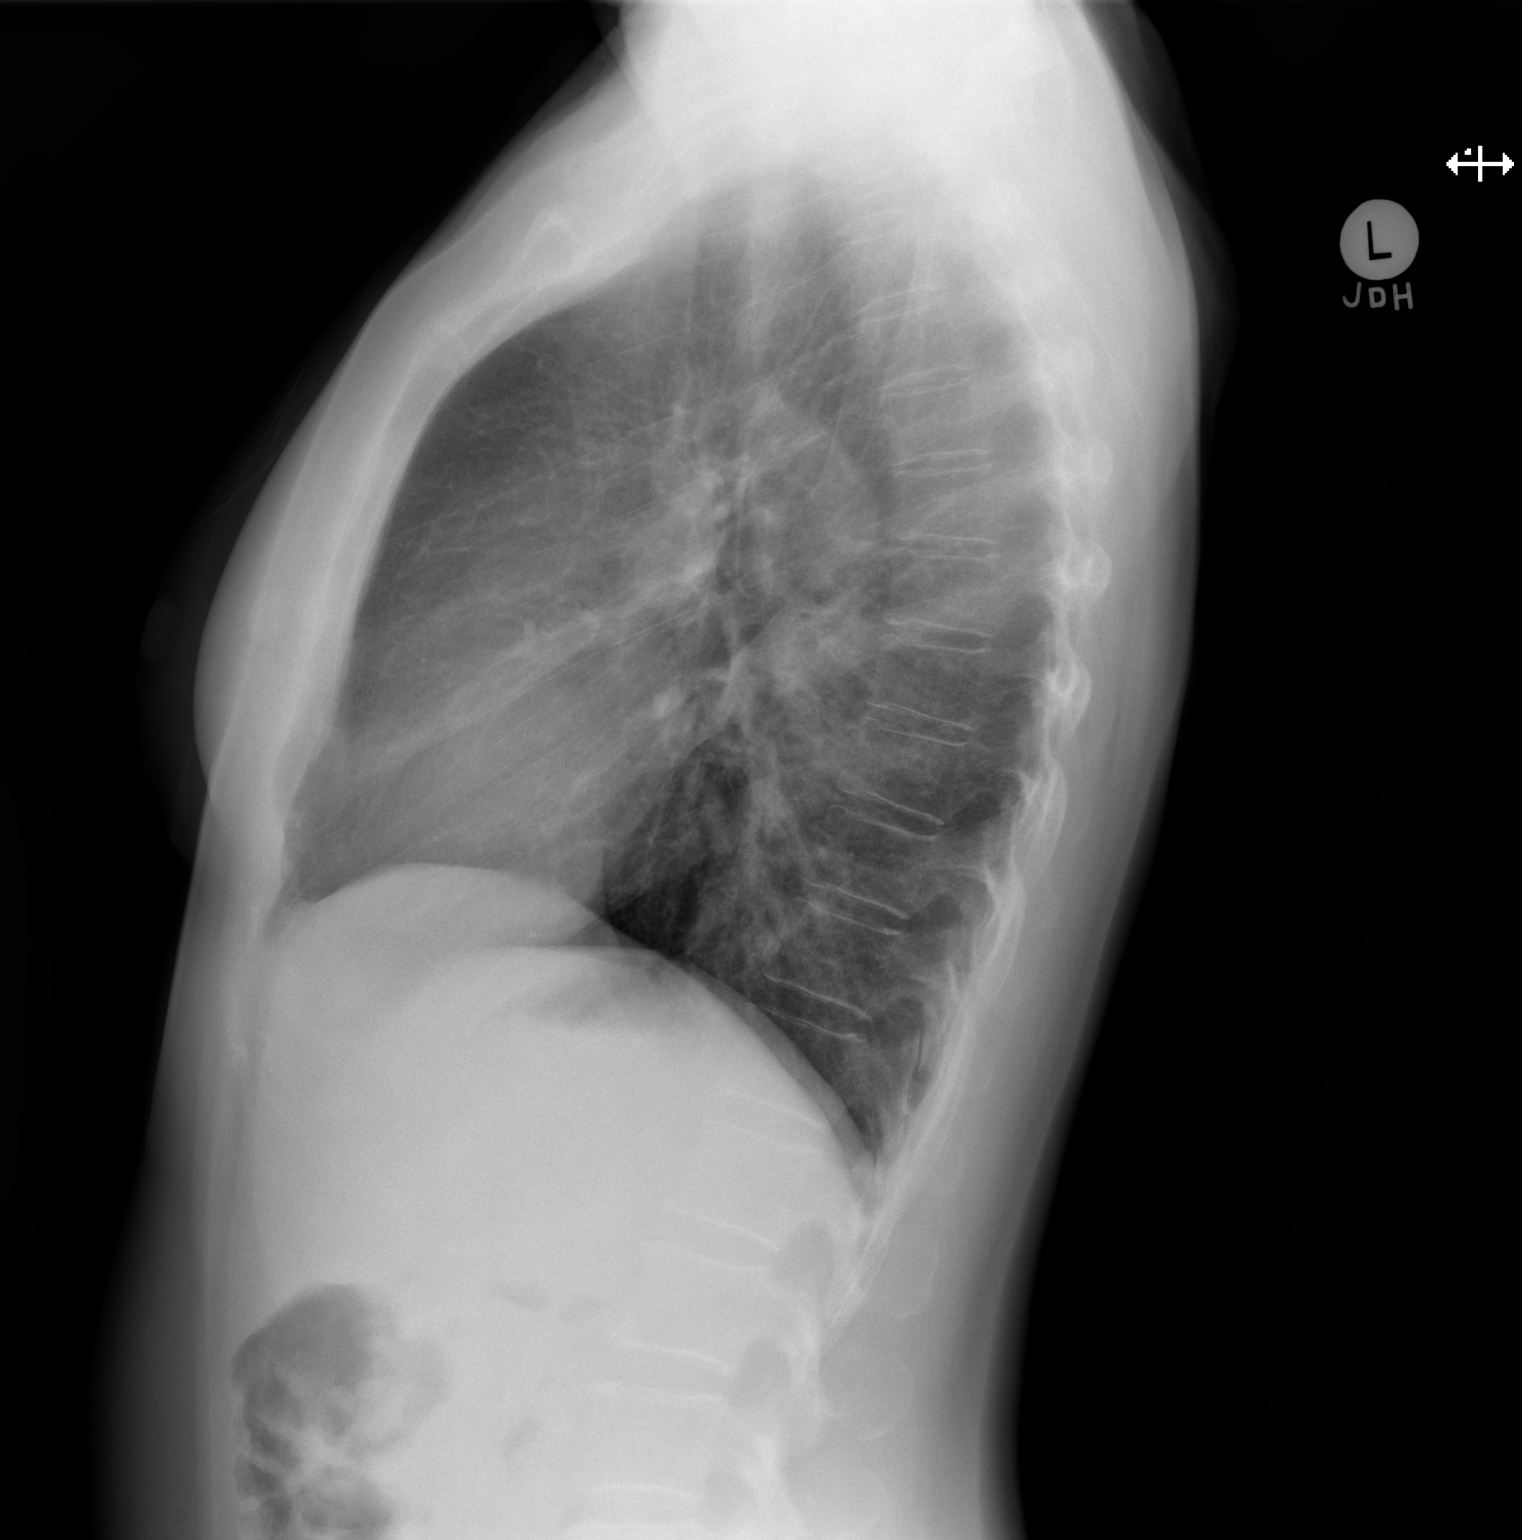

[2 of 2 positions shown; findings below may reference images not displayed]

FINDINGS: The heart size and mediastinal contours are stable. There is new
focal left infrahilar airspace disease which appears to be within
the lower lobe on the lateral view. The right lung is clear. There
is no pleural effusion or pneumothorax. The bones appear unchanged.
IMPRESSION: Left lower lobe pneumonia. Followup PA and lateral chest X-ray is
recommended in 3-4 weeks following trial of antibiotic therapy to
ensure resolution and exclude underlying malignancy.

These results will be called to the ordering clinician or
representative by the [HOSPITAL] at the imaging location.

## 2016-11-11 DIAGNOSIS — M0579 Rheumatoid arthritis with rheumatoid factor of multiple sites without organ or systems involvement: Secondary | ICD-10-CM | POA: Diagnosis not present

## 2016-12-25 DIAGNOSIS — M810 Age-related osteoporosis without current pathological fracture: Secondary | ICD-10-CM | POA: Diagnosis not present

## 2016-12-25 DIAGNOSIS — M150 Primary generalized (osteo)arthritis: Secondary | ICD-10-CM | POA: Diagnosis not present

## 2016-12-25 DIAGNOSIS — M0579 Rheumatoid arthritis with rheumatoid factor of multiple sites without organ or systems involvement: Secondary | ICD-10-CM | POA: Diagnosis not present

## 2016-12-31 ENCOUNTER — Encounter: Payer: Self-pay | Admitting: Internal Medicine

## 2016-12-31 ENCOUNTER — Ambulatory Visit (INDEPENDENT_AMBULATORY_CARE_PROVIDER_SITE_OTHER): Payer: BLUE CROSS/BLUE SHIELD | Admitting: Internal Medicine

## 2016-12-31 VITALS — BP 116/68 | HR 71 | Temp 98.1°F | Wt 141.0 lb

## 2016-12-31 DIAGNOSIS — M059 Rheumatoid arthritis with rheumatoid factor, unspecified: Secondary | ICD-10-CM | POA: Diagnosis not present

## 2016-12-31 DIAGNOSIS — K625 Hemorrhage of anus and rectum: Secondary | ICD-10-CM

## 2016-12-31 DIAGNOSIS — K5900 Constipation, unspecified: Secondary | ICD-10-CM

## 2016-12-31 NOTE — Progress Notes (Addendum)
   Subjective:    Patient ID: Katrina [name], female    DOB: 1956-03-17, 61 y.o.   MRN: [id number]  HPI She has a history of rheumatoid arthritis and takes meloxicam. She went to [place] a few weeks ago and developed constipation. Since getting home things have been straightening out some but she noticed blood in the toilet bowl yesterday and became frightened. She has had a colonoscopy in 2015 by Dr. Marney Setting in [place]. She had a polyp removed that was benign. Cannot find procedure note in Care everywhere. Cannot find pathology report.    Review of Systems see above this is her first episode of rectal bleeding. Doesn't take fiber supplement or MiraLAX.      Objective:   Physical Exam  There is no significant stool guaiac. There was a little bit of mucus that was guaiaced and it was negative. She has no anal fissure and no external hemorrhoids. Anoscopy shows no evidence of rectal bleeding but some small internal hemorrhoids.      A he isssessment & Plan:  Rectal bleeding likely related constipation-resolved. Probably aggravated by meloxicam.  Plan: Patient reassured. We'll going to simply see if this will go away. I recommended MiraLAX daily.

## 2016-12-31 NOTE — Patient Instructions (Signed)
Anoscopy is unremarkable. See if bleeding will just resolve. Take MiraLAX daily.

## 2017-02-09 DIAGNOSIS — M25562 Pain in left knee: Secondary | ICD-10-CM | POA: Diagnosis not present

## 2017-02-09 DIAGNOSIS — M0579 Rheumatoid arthritis with rheumatoid factor of multiple sites without organ or systems involvement: Secondary | ICD-10-CM | POA: Diagnosis not present

## 2017-02-09 DIAGNOSIS — M150 Primary generalized (osteo)arthritis: Secondary | ICD-10-CM | POA: Diagnosis not present

## 2017-02-09 DIAGNOSIS — M810 Age-related osteoporosis without current pathological fracture: Secondary | ICD-10-CM | POA: Diagnosis not present

## 2017-02-16 ENCOUNTER — Ambulatory Visit (INDEPENDENT_AMBULATORY_CARE_PROVIDER_SITE_OTHER): Payer: BLUE CROSS/BLUE SHIELD | Admitting: Internal Medicine

## 2017-02-16 DIAGNOSIS — Z23 Encounter for immunization: Secondary | ICD-10-CM | POA: Diagnosis not present

## 2017-02-16 NOTE — Progress Notes (Signed)
Flu vaccine given.

## 2017-02-16 NOTE — Patient Instructions (Addendum)
Flu vaccine given.

## 2017-04-28 DIAGNOSIS — H04123 Dry eye syndrome of bilateral lacrimal glands: Secondary | ICD-10-CM | POA: Diagnosis not present

## 2017-04-28 DIAGNOSIS — H16101 Unspecified superficial keratitis, right eye: Secondary | ICD-10-CM | POA: Diagnosis not present

## 2017-04-28 DIAGNOSIS — H40031 Anatomical narrow angle, right eye: Secondary | ICD-10-CM | POA: Diagnosis not present

## 2017-05-12 DIAGNOSIS — M0579 Rheumatoid arthritis with rheumatoid factor of multiple sites without organ or systems involvement: Secondary | ICD-10-CM | POA: Diagnosis not present

## 2017-05-14 ENCOUNTER — Telehealth: Payer: Self-pay | Admitting: Obstetrics and Gynecology

## 2017-05-14 NOTE — Telephone Encounter (Signed)
Left message regarding upcoming appointment has been canceled and needs to be rescheduled. °

## 2017-05-19 ENCOUNTER — Ambulatory Visit: Payer: BLUE CROSS/BLUE SHIELD | Admitting: Obstetrics and Gynecology

## 2017-06-03 ENCOUNTER — Other Ambulatory Visit: Payer: Self-pay | Admitting: Internal Medicine

## 2017-06-03 DIAGNOSIS — Z139 Encounter for screening, unspecified: Secondary | ICD-10-CM

## 2017-07-02 DIAGNOSIS — M79672 Pain in left foot: Secondary | ICD-10-CM | POA: Diagnosis not present

## 2017-07-02 DIAGNOSIS — L608 Other nail disorders: Secondary | ICD-10-CM | POA: Diagnosis not present

## 2017-07-21 ENCOUNTER — Ambulatory Visit: Payer: BLUE CROSS/BLUE SHIELD | Admitting: Obstetrics and Gynecology

## 2017-07-21 ENCOUNTER — Other Ambulatory Visit: Payer: Self-pay

## 2017-07-21 ENCOUNTER — Other Ambulatory Visit (HOSPITAL_COMMUNITY)
Admission: RE | Admit: 2017-07-21 | Discharge: 2017-07-21 | Disposition: A | Discharge status: Home / Self Care | Payer: BLUE CROSS/BLUE SHIELD | Source: Ambulatory Visit | Attending: Obstetrics and Gynecology | Admitting: Obstetrics and Gynecology

## 2017-07-21 ENCOUNTER — Encounter: Payer: Self-pay | Admitting: Obstetrics and Gynecology

## 2017-07-21 VITALS — BP 112/66 | HR 64 | Resp 14 | Ht 62.5 in | Wt 141.0 lb

## 2017-07-21 DIAGNOSIS — Z01419 Encounter for gynecological examination (general) (routine) without abnormal findings: Secondary | ICD-10-CM

## 2017-07-21 NOTE — Patient Instructions (Signed)

## 2017-07-21 NOTE — Addendum Note (Signed)
Addended by: Ardell Isaacs, Debbe Bales E on: 07/21/2017 04:53 PM   Modules accepted: Orders

## 2017-07-21 NOTE — Progress Notes (Signed)
62 y.o. G43P0010 Married Caucasian female here for annual exam.    Doing yoga and meditation.   No vaginal bleeding.   PCP:  Sharlet Salina, MD Rheumatology:  Dr. Mallie Mussel.    Patient's last menstrual period was 05/04/2002 (approximate).           Sexually active: No.  The current method of family planning is post menopausal status.    Exercising: Yes.    Yoga. Smoker:  Former  Health Maintenance: Pap:  04-10-15 Neg:Neg HR HPV History of abnormal Pap:  Yes, In her 30's had colposcopy but no treatment to cervix--repeat pap normal. MMG:  07/16/16 BIRADS 1 negative/density c -- scheduled tomorrow at [facility] Colonoscopy:  04-19-14 normal in [place];next due 04/2024 BMD:   07/29/16  Result  Osteoporosis.  Fosamax for 5 - 6 years.  TDaP:  04/17/10 Gardasil:   n/a HIV: Neg per patient Hep C: Neg per patient Screening Labs:  Hb today: PCP/Rheumatologist, Urine today: not done   reports that she quit smoking about 26 years ago. Her smoking use included cigarettes. She smoked 1.00 pack per day. she has never used smokeless tobacco. She reports that she does not drink alcohol or use drugs.  Past Medical History:  Diagnosis Date  . Arthritis    --rheumatoid  . Osteoporosis 04/05/14   T score -2.6 spine  . Salpingitis    Had appendectomy at same time as salpingitis dx was made.    Past Surgical History:  Procedure Laterality Date  . APPENDECTOMY    . left breast biopsy    . TONSILLECTOMY AND ADENOIDECTOMY  2014    Current Outpatient Medications  Medication Sig Dispense Refill  . calcium carbonate (OS-CAL) 600 MG TABS Take 600 mg by mouth 2 (two) times daily with a meal.      . cetirizine (ZYRTEC) 10 MG chewable tablet Chew 10 mg by mouth as needed for allergies.    . cholecalciferol (VITAMIN D) 1000 UNITS tablet Take by mouth daily.      . fish oil-omega-3 fatty acids 1000 MG capsule Take 2 g by mouth daily.      . folic acid (FOLVITE) 1 MG tablet Take 1 mg by mouth daily.      Marland Kitchen  HUMIRA PEN 40 MG/0.8ML PNKT   3  . meloxicam (MOBIC) 15 MG tablet Take 15 mg by mouth as needed for pain.    . methotrexate (RHEUMATREX) 2.5 MG tablet Take 15 mg by mouth once a week. Caution:Chemotherapy. Protect from light.    . vitamin C (ASCORBIC ACID) 250 MG tablet Take 250 mg by mouth daily.     No current facility-administered medications for this visit.     Family History  Problem Relation Age of Onset  . Cancer Mother 3       Dec Lung CA  . Thyroid disease Mother        thyroidectomy in her 20's--unsure of reason  . Cancer Father        Dec Bladder CA age 11  . Heart disease Father   . Hypertension Father   . Hyperlipidemia Father   . Stroke Paternal Grandfather     ROS:  Pertinent items are noted in HPI.  Otherwise, a comprehensive ROS was negative.  Exam:   BP 112/66 (BP Location: Right Arm, Patient Position: Sitting, Cuff Size: Normal)   Pulse 64   Resp 14   Ht 5' 2.5" (1.588 m)   Wt 141 lb (64 kg)  LMP 05/04/2002 (Approximate)   BMI 25.38 kg/m     General appearance: alert, cooperative and appears stated age Head: Normocephalic, without obvious abnormality, atraumatic Neck: no adenopathy, supple, symmetrical, trachea midline and thyroid normal to inspection and palpation Lungs: clear to auscultation bilaterally Breasts: normal appearance, no masses or tenderness, No nipple retraction or dimpling, No nipple discharge or bleeding, No axillary or supraclavicular adenopathy Heart: regular rate and rhythm Abdomen: soft, non-tender; no masses, no organomegaly Extremities: extremities normal, atraumatic, no cyanosis or edema Skin: Skin color, texture, turgor normal. No rashes or lesions Lymph nodes: Cervical, supraclavicular, and axillary nodes normal. No abnormal inguinal nodes palpated Neurologic: Grossly normal  Pelvic: External genitalia:  no lesions              Urethra:  normal appearing urethra with no masses, tenderness or lesions               Bartholins and Skenes: normal                 Vagina: normal appearing vagina with normal color and discharge, no lesions              Cervix: no lesions              Pap taken: Yes.   Bimanual Exam:  Uterus:  normal size, contour, position, consistency, mobility, non-tender              Adnexa: no mass, fullness, tenderness              Rectal exam: Yes.  .  Confirms.              Anus:  normal sphincter tone, no lesions  Chaperone was present for exam.  Assessment:   Well woman visit with normal exam. Osteoporosis.   PCP and Rheumatologist following.  Rheumatoid arthritis. On Humira and MTX. Remote hx of abnormal pap.  Hx salpingitis.  Hx left breast biopsy.   Plan: Mammogram screening discussed. Recommended self breast awareness. Pap and HR HPV as above. Will do yearly paps.  Guidelines for Calcium, Vitamin D, regular exercise program including cardiovascular and weight bearing exercise.   Follow up annually and prn.   After visit summary provided.

## 2017-07-22 ENCOUNTER — Ambulatory Visit
Admission: RE | Admit: 2017-07-22 | Discharge: 2017-07-22 | Disposition: A | Discharge status: Home / Self Care | Payer: BLUE CROSS/BLUE SHIELD | Source: Ambulatory Visit | Attending: Internal Medicine | Admitting: Internal Medicine

## 2017-07-22 DIAGNOSIS — Z139 Encounter for screening, unspecified: Secondary | ICD-10-CM

## 2017-07-22 DIAGNOSIS — Z1231 Encounter for screening mammogram for malignant neoplasm of breast: Secondary | ICD-10-CM | POA: Diagnosis not present

## 2017-07-23 LAB — CYTOLOGY - PAP
Diagnosis: NEGATIVE
HPV: NOT DETECTED

## 2017-08-10 DIAGNOSIS — M810 Age-related osteoporosis without current pathological fracture: Secondary | ICD-10-CM | POA: Diagnosis not present

## 2017-08-10 DIAGNOSIS — M150 Primary generalized (osteo)arthritis: Secondary | ICD-10-CM | POA: Diagnosis not present

## 2017-08-10 DIAGNOSIS — M0579 Rheumatoid arthritis with rheumatoid factor of multiple sites without organ or systems involvement: Secondary | ICD-10-CM | POA: Diagnosis not present

## 2017-09-09 DIAGNOSIS — D2261 Melanocytic nevi of right upper limb, including shoulder: Secondary | ICD-10-CM | POA: Diagnosis not present

## 2017-09-09 DIAGNOSIS — D2271 Melanocytic nevi of right lower limb, including hip: Secondary | ICD-10-CM | POA: Diagnosis not present

## 2017-09-09 DIAGNOSIS — L570 Actinic keratosis: Secondary | ICD-10-CM | POA: Diagnosis not present

## 2017-09-09 DIAGNOSIS — Z86018 Personal history of other benign neoplasm: Secondary | ICD-10-CM | POA: Diagnosis not present

## 2017-10-07 DIAGNOSIS — H2513 Age-related nuclear cataract, bilateral: Secondary | ICD-10-CM | POA: Diagnosis not present

## 2017-10-07 DIAGNOSIS — H40033 Anatomical narrow angle, bilateral: Secondary | ICD-10-CM | POA: Diagnosis not present

## 2017-10-14 ENCOUNTER — Telehealth: Payer: Self-pay

## 2017-10-14 NOTE — Telephone Encounter (Signed)
Patient called wanting to see if Dr. Lenord Fellers would see her for her toe nail, she said it's been "split" looks like it's dying since August. She went to seean Ortho who was recommended bu a PA friend and was advised to apply vaporub on it but it did not help since then it looks worse. She was advised to call [facility] to get an appt I offered to give her their phone number and she declined it.

## 2017-11-09 DIAGNOSIS — M0579 Rheumatoid arthritis with rheumatoid factor of multiple sites without organ or systems involvement: Secondary | ICD-10-CM | POA: Diagnosis not present

## 2017-11-18 ENCOUNTER — Encounter: Payer: Self-pay | Admitting: Internal Medicine

## 2017-11-18 ENCOUNTER — Ambulatory Visit
Admission: RE | Admit: 2017-11-18 | Discharge: 2017-11-18 | Disposition: A | Discharge status: Home / Self Care | Payer: BLUE CROSS/BLUE SHIELD | Source: Ambulatory Visit | Attending: Internal Medicine | Admitting: Internal Medicine

## 2017-11-18 ENCOUNTER — Ambulatory Visit (INDEPENDENT_AMBULATORY_CARE_PROVIDER_SITE_OTHER): Payer: BLUE CROSS/BLUE SHIELD | Admitting: Internal Medicine

## 2017-11-18 VITALS — BP 102/60 | HR 77 | Temp 98.0°F | Ht 62.5 in | Wt 140.0 lb

## 2017-11-18 DIAGNOSIS — R0989 Other specified symptoms and signs involving the circulatory and respiratory systems: Secondary | ICD-10-CM

## 2017-11-18 DIAGNOSIS — M069 Rheumatoid arthritis, unspecified: Secondary | ICD-10-CM

## 2017-11-18 DIAGNOSIS — R05 Cough: Secondary | ICD-10-CM | POA: Diagnosis not present

## 2017-11-18 DIAGNOSIS — J22 Unspecified acute lower respiratory infection: Secondary | ICD-10-CM

## 2017-11-18 MED ORDER — LEVOFLOXACIN 500 MG PO TABS: 500.0000 mg | ORAL_TABLET | Freq: Every day | ORAL | 0 refills | Status: DC

## 2017-11-18 NOTE — Progress Notes (Signed)
   Subjective:    Patient ID: Katrina [name], female    DOB: [birth year], 62 y.o.   MRN: [id number]  HPI 2-week history of cough and congestion.  History of rheumatoid arthritis treated with DMARD.  No documented fever.  History of pneumonia in December 2016 treated with Levaquin.  Was given Pneumococcal 23 in 2010 and Prevnar 13 in 2017.      Review of Systems     Objective:   Physical Exam  Constitutional: She is oriented to person, place, and time. She appears well-developed and well-nourished. No distress.  HENT:  Head: Normocephalic and atraumatic.  Right Ear: External ear normal.  Left Ear: External ear normal.  Nose: Nose normal.  Mouth/Throat: Oropharynx is clear and moist. No oropharyngeal exudate.  Eyes: Conjunctivae are normal. Right eye exhibits no discharge. Left eye exhibits no discharge. No scleral icterus.  Neck: Neck supple. No JVD present. No thyromegaly present.  Cardiovascular: Normal rate, regular rhythm and normal heart sounds. Exam reveals no friction rub.  No murmur heard. Pulmonary/Chest: Effort normal. No stridor. No respiratory distress. She has no wheezes. She has no rales.  Musculoskeletal: She exhibits no edema.  Lymphadenopathy:    She has no cervical adenopathy.  Neurological: She is alert and oriented to person, place, and time.  Skin: Skin is warm and dry. She is not diaphoretic. No erythema.  Vitals reviewed.         Assessment & Plan:  Acute lower respiratory infection  Plan: She will have chest x-ray.  She will start Levaquin 500 mg daily for 10 days.  Call if not improving in 48 to 72 hours.  Addendum: Chest x-ray shows no evidence of pneumonia.

## 2017-11-19 ENCOUNTER — Encounter: Payer: Self-pay | Admitting: Internal Medicine

## 2017-11-26 NOTE — Patient Instructions (Signed)
Have chest x-ray today.  Levaquin 500 mg daily for 10 days.

## 2018-01-10 ENCOUNTER — Encounter: Payer: Self-pay | Admitting: Internal Medicine

## 2018-01-10 ENCOUNTER — Ambulatory Visit: Payer: BLUE CROSS/BLUE SHIELD | Admitting: Internal Medicine

## 2018-01-10 VITALS — BP 120/60 | HR 70 | Ht 62.5 in | Wt 147.0 lb

## 2018-01-10 DIAGNOSIS — J029 Acute pharyngitis, unspecified: Secondary | ICD-10-CM | POA: Diagnosis not present

## 2018-01-10 DIAGNOSIS — J22 Unspecified acute lower respiratory infection: Secondary | ICD-10-CM

## 2018-01-10 MED ORDER — LEVOFLOXACIN 500 MG PO TABS: 500.0000 mg | ORAL_TABLET | Freq: Every day | ORAL | 0 refills | Status: DC

## 2018-01-10 NOTE — Progress Notes (Signed)
   Subjective:    Patient ID: Katrina [name], female    DOB: June 01, 1955, 62 y.o.   MRN: [id number]  HPI 62 year old Female with history of rheumatoid arthritis on Humira and MTX in today with another URI. Seen in July with bronchitis and responded well to Levaquin. CXR then showed no pneumonia. She is worried about immune status. 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 May benefit from some immune status testing such as pneumococcal serotype antibody testing.She had pneumococcal 13 in 2017 and  Pneumovax 23 in 2010.  Is very worried about this although seldom has respiratory infections. Travels to [place] for scuba diving for about 2 weeks a year.    Review of Systems slight scratchy throat, complaining of cough that is nonproductive     Objective:   Physical Exam Skin warm and dry.  Nodes none.  Pharynx is injected without exudate.  TMs slightly full.  Neck supple.  Chest clear to auscultation without rales or wheezing       Assessment & Plan:  Acute lower respiratory infection  Acute pharyngitis  Plan: Levaquin 500 mg daily for 10 days.  Appointment with pulmonologist to discuss her concerns regarding Humira and methotrexate therapy and respiratory infections.

## 2018-01-10 NOTE — Patient Instructions (Signed)
Levaquin 500 mg daily for 10 days.  To see pulmonologist regarding her concerns about recurrent respiratory infections and therapy with Humira and methotrexate

## 2018-01-27 ENCOUNTER — Encounter: Payer: Self-pay | Admitting: Internal Medicine

## 2018-01-27 ENCOUNTER — Ambulatory Visit: Payer: BLUE CROSS/BLUE SHIELD | Admitting: Internal Medicine

## 2018-01-27 VITALS — BP 124/80 | HR 87 | Ht 62.25 in | Wt 139.2 lb

## 2018-01-27 DIAGNOSIS — R05 Cough: Secondary | ICD-10-CM | POA: Diagnosis not present

## 2018-01-27 DIAGNOSIS — R053 Chronic cough: Secondary | ICD-10-CM

## 2018-01-27 NOTE — Assessment & Plan Note (Signed)
The most common causes of chronic cough in immunocompetent adults include the following: upper airway cough syndrome (UACS), previously referred to as postnasal drip syndrome (PNDS), which is caused by variety of rhinosinus conditions; (2) asthma; (3) GERD; (4) chronic bronchitis from cigarette smoking or other inhaled environmental irritants; (5) nonasthmatic eosinophilic bronchitis; and (6) bronchiectasis.   These conditions, singly or in combination, have accounted for up to 94% of the causes of chronic cough in prospective studies.   Other conditions have constituted no >6% of the causes in prospective studies These have included bronchogenic carcinoma, chronic interstitial pneumonia(either from RA or from RA drugs in ddx) , sarcoidosis, left ventricular failure, ACEI-induced cough, and aspiration from a condition associated with pharyngeal dysfunction.    Chronic cough is often simultaneously caused by more than one condition. A single cause has been found from 38 to 82% of the time, multiple causes from 18 to 62%. Multiply caused cough has been the result of three diseases up to 42% of the time.    I assured her she really doesn't fit the picture of copd / cb and probably just getting recurrent acute bronchitis from URI's but to be sure needs at the very least full pfts and if pattern continues would do HRCT (bronchiectasis) and sinus CT (sinusitis) to complete the w/u.   Can certainly use mucinex or mucinex dm prn cough and observe gerd diet / rx for gerd empirically for lingering cough due to concern that of the three most common causes of  Sub-acute / recurrent or chronic cough, only one (GERD)  can actually contribute to/ trigger  the other two (asthma and post nasal drip syndrome)  and perpetuate the cylce of cough.  While not intuitively obvious, many patients with chronic low grade reflux do not cough until there is a primary insult that disturbs the protective epithelial barrier and  exposes sensitive nerve endings.   This is typically viral but can due to PNDS and  either may apply here.       Total time devoted to counseling  > 50 % of initial 60 min office visit:  review case with pt/husband  discussion of options/alternatives/ personally creating written customized instructions  in presence of pt  then going over those specific  Instructions directly with the pt including how to use all of the meds but in particular covering each new medication in detail and the difference between the maintenance= "automatic" meds and the prns using an action plan format for the latter (If this problem/symptom => do that organization reading Left to right).  Please see AVS from this visit for a full list of these instructions which I personally wrote for this pt and  are unique to this visit.

## 2018-01-27 NOTE — Patient Instructions (Addendum)
GERD (REFLUX)  is an extremely common cause of respiratory symptoms just like yours , many times with no obvious heartburn at all.    It can be treated with medication, but also with lifestyle changes including elevation of the head of your bed (ideally with 6 inch  bed blocks),  Smoking cessation, avoidance of late meals, excessive alcohol, and avoid fatty foods, chocolate, peppermint, colas, red wine, and acidic juices such as orange juice.  NO MINT OR MENTHOL PRODUCTS SO NO COUGH DROPS   USE SUGARLESS CANDY INSTEAD (Jolley ranchers or Stover's or Life Savers) or even ice chips will also do - the key is to swallow to prevent all throat clearing. NO OIL BASED VITAMINS - use powdered substitutes.  For cough/ congestion >   mucinex or mucinex DM up to 1200 mg every 12 hours as needed   Please schedule a follow up office visit in 4 weeks, sooner if needed with pfts on return

## 2018-01-27 NOTE — Progress Notes (Signed)
Katrina [name], female    DOB: 13-Apr-1956,   MRN: [id number]    Brief patient profile:  28 yowf  Quit smoking 1992 with some cough that resolved s assoc doe or need for any inhalers developed RA followed by Katrina [name] with good control with mtx then humira added around 2011  With CAP 04/15/15 LLL rx levaquin 100% improvement after several months and fine again until end of June 2019 acute onset  cough slt green mucus with nl cxr 11/18/17 rx levaquin  Mostly better some lingering cough/ fatigue/ sob and then much worse again Sept 5 2019 very similar symptoms  rx levaquin again and still not 100%  And concerned each time she gets"bronchitis" she has to stop her RA meds and then this acts up so referred to pulmonary clinic 01/27/2018 by Dr  Delbert Harness with ? CB    History of Present Illness  01/27/2018  1st pulmonary eval / Katrina [name]  Chief Complaint  Patient presents with  . Pulmonary Consult    Referred by Dr. Marlan Palau. Pt states had cxr in July 2019 and was told she had chronic bronchitis. She has been coughing off and on since June 2019.  Cough is non prod.   Dyspnea:  Min impact on ex tol/ more limited  Fatigue, capacity to do  eliptcal about the same as baseline  Cough: mostly better now, very min productive daytime only  Sleep: intermittently disturbed insomnia / flat in bed with one pillow SABA use: none    No obvious day to day or daytime variability or assoc excess/ purulent sputum or mucus plugs or hemoptysis or cp or chest tightness, subjective wheeze or overt sinus or hb symptoms.   Sleep as above  without nocturnal  or early am exacerbation  of respiratory  c/o's or need for noct saba. Also denies any obvious fluctuation of symptoms with weather or environmental changes or other aggravating or alleviating factors except as outlined above   No unusual exposure hx or h/o childhood pna/ asthma or knowledge of premature birth.  Current Allergies, Complete Past Medical History, Past  Surgical History, Family History, and Social History were reviewed in [employer] record.  ROS  The following are not active complaints unless bolded Hoarseness, sore throat, dysphagia, dental problems, itching, sneezing,  nasal congestion or discharge of excess mucus or purulent secretions, ear ache,   fever, chills, sweats, unintended wt loss or wt gain, classically pleuritic or exertional cp,  orthopnea pnd or arm/hand swelling  or leg swelling, presyncope, palpitations, abdominal pain, anorexia, nausea, vomiting, diarrhea  or change in bowel habits or change in bladder habits, change in stools or change in urine, dysuria, hematuria,  rash, arthralgias, visual complaints, headache, numbness, weakness or ataxia or problems with walking or coordination,  change in mood or  memory.        Current Meds  Medication Sig  . Ascorbic Acid (VITAMIN C) 1000 MG tablet Take 1,000 mg by mouth daily.  . calcium carbonate (OS-CAL) 600 MG TABS Take 600 mg by mouth 2 (two) times daily with a meal.    . cholecalciferol (VITAMIN D) 1000 UNITS tablet Take by mouth daily.    . fish oil-omega-3 fatty acids 1000 MG capsule Take 2 g by mouth daily.    Marland Kitchen FOLIC ACID PO Take 1,200 mg by mouth daily.  Marland Kitchen HUMIRA PEN 40 MG/0.8ML PNKT   . methotrexate (RHEUMATREX) 2.5 MG tablet Take 15 mg by mouth once a  week. Caution:Chemotherapy. Protect from light.         Past Medical History:  Diagnosis Date  . Arthritis    --rheumatoid  . Osteoporosis 04/05/14   T score -2.6 spine  . Salpingitis    Had appendectomy at same time as salpingitis dx was made.    Outpatient Medications Prior to Visit  Medication Sig Dispense Refill  . Ascorbic Acid (VITAMIN C) 1000 MG tablet Take 1,000 mg by mouth daily.    . calcium carbonate (OS-CAL) 600 MG TABS Take 600 mg by mouth 2 (two) times daily with a meal.      . cholecalciferol (VITAMIN D) 1000 UNITS tablet Take by mouth daily.      . fish oil-omega-3 fatty  acids 1000 MG capsule Take 2 g by mouth daily.      Marland Kitchen FOLIC ACID PO Take 1,200 mg by mouth daily.    Marland Kitchen HUMIRA PEN 40 MG/0.8ML PNKT   3  . methotrexate (RHEUMATREX) 2.5 MG tablet Take 15 mg by mouth once a week. Caution:Chemotherapy. Protect from light.    . cetirizine (ZYRTEC) 10 MG chewable tablet Chew 10 mg by mouth as needed for allergies.    . folic acid (FOLVITE) 1 MG tablet Take 1 mg by mouth daily.      Marland Kitchen levofloxacin (LEVAQUIN) 500 MG tablet Take 1 tablet (500 mg total) by mouth daily. 10 tablet 0  . meloxicam (MOBIC) 15 MG tablet Take 15 mg by mouth as needed for pain.    . vitamin C (ASCORBIC ACID) 250 MG tablet Take 250 mg by mouth daily.             Objective:     BP 124/80 (BP Location: Left Arm, Cuff Size: Normal)   Pulse 87   Ht 5' 2.25" (1.581 m)   Wt 139 lb 3.2 oz (63.1 kg)   LMP 05/04/2002 (Approximate)   SpO2 94%   BMI 25.26 kg/m   SpO2: 94 % RA  In general very pleasant amb wf and   HEENT: nl dentition, turbinates bilaterally, and oropharynx. Nl external ear canals without cough reflex   NECK :  without JVD/Nodes/TM/ nl carotid upstrokes bilaterally   LUNGS: no acc muscle use,  Nl contour chest which is clear to A and P bilaterally without cough on insp or exp maneuvers   CV:  RRR  no s3 or murmur or increase in P2, and no edema   ABD:  soft and nontender with nl inspiratory excursion in the supine position. No bruits or organomegaly appreciated, bowel sounds nl  MS:  Nl gait/ ext warm without deformities, calf tenderness, cyanosis or clubbing No obvious joint restrictions   SKIN: warm and dry without lesions    NEURO:  alert, approp, nl sensorium with  no motor or cerebellar deficits apparent.     I personally reviewed images and agree with radiology impression as follows:  CXR:   11/18/17  Minimal chronic bronchitic changes without infiltrate       Assessment   Chronic cough The most common causes of chronic cough in immunocompetent  adults include the following: upper airway cough syndrome (UACS), previously referred to as postnasal drip syndrome (PNDS), which is caused by variety of rhinosinus conditions; (2) asthma; (3) GERD; (4) chronic bronchitis from cigarette smoking or other inhaled environmental irritants; (5) nonasthmatic eosinophilic bronchitis; and (6) bronchiectasis.   These conditions, singly or in combination, have accounted for up to 94% of the causes of chronic cough  in prospective studies.   Other conditions have constituted no >6% of the causes in prospective studies These have included bronchogenic carcinoma, chronic interstitial pneumonia(either from RA or from RA drugs in ddx) , sarcoidosis, left ventricular failure, ACEI-induced cough, and aspiration from a condition associated with pharyngeal dysfunction.    Chronic cough is often simultaneously caused by more than one condition. A single cause has been found from 38 to 82% of the time, multiple causes from 18 to 62%. Multiply caused cough has been the result of three diseases up to 42% of the time.    I assured her she really doesn't fit the picture of copd / cb and probably just getting recurrent acute bronchitis from URI's but to be sure needs at the very least full pfts and if pattern continues would do HRCT (bronchiectasis) and sinus CT (sinusitis) to complete the w/u.   Can certainly use mucinex or mucinex dm prn cough and observe gerd diet / rx for gerd empirically for lingering cough due to concern that of the three most common causes of  Sub-acute / recurrent or chronic cough, only one (GERD)  can actually contribute to/ trigger  the other two (asthma and post nasal drip syndrome)  and perpetuate the cylce of cough.  While not intuitively obvious, many patients with chronic low grade reflux do not cough until there is a primary insult that disturbs the protective epithelial barrier and exposes sensitive nerve endings.   This is typically viral but  can due to PNDS and  either may apply here.       Total time devoted to counseling  > 50 % of initial 60 min office visit:  review case with pt/husband  discussion of options/alternatives/ personally creating written customized instructions  in presence of pt  then going over those specific  Instructions directly with the pt including how to use all of the meds but in particular covering each new medication in detail and the difference between the maintenance= "automatic" meds and the prns using an action plan format for the latter (If this problem/symptom => do that organization reading Left to right).  Please see AVS from this visit for a full list of these instructions which I personally wrote for this pt and  are unique to this visit.         Sandrea Hughs, MD 01/27/2018

## 2018-02-01 ENCOUNTER — Ambulatory Visit (INDEPENDENT_AMBULATORY_CARE_PROVIDER_SITE_OTHER): Payer: BLUE CROSS/BLUE SHIELD | Admitting: Internal Medicine

## 2018-02-01 ENCOUNTER — Encounter: Payer: Self-pay | Admitting: Internal Medicine

## 2018-02-01 DIAGNOSIS — Z23 Encounter for immunization: Secondary | ICD-10-CM

## 2018-02-01 NOTE — Patient Instructions (Signed)
Patient received a flu vaccine IM L deltoid, AV, CMA  

## 2018-02-01 NOTE — Progress Notes (Signed)
Flu vaccine given by CMA 

## 2018-02-08 ENCOUNTER — Encounter: Payer: Self-pay | Admitting: Internal Medicine

## 2018-02-08 DIAGNOSIS — M0579 Rheumatoid arthritis with rheumatoid factor of multiple sites without organ or systems involvement: Secondary | ICD-10-CM | POA: Diagnosis not present

## 2018-02-08 DIAGNOSIS — M810 Age-related osteoporosis without current pathological fracture: Secondary | ICD-10-CM | POA: Diagnosis not present

## 2018-02-08 DIAGNOSIS — M150 Primary generalized (osteo)arthritis: Secondary | ICD-10-CM | POA: Diagnosis not present

## 2018-03-15 ENCOUNTER — Encounter: Payer: Self-pay | Admitting: Internal Medicine

## 2018-03-15 ENCOUNTER — Ambulatory Visit: Payer: BLUE CROSS/BLUE SHIELD | Admitting: Internal Medicine

## 2018-03-15 ENCOUNTER — Ambulatory Visit (INDEPENDENT_AMBULATORY_CARE_PROVIDER_SITE_OTHER): Payer: BLUE CROSS/BLUE SHIELD | Admitting: Internal Medicine

## 2018-03-15 DIAGNOSIS — R05 Cough: Secondary | ICD-10-CM | POA: Diagnosis not present

## 2018-03-15 DIAGNOSIS — J449 Chronic obstructive pulmonary disease, unspecified: Secondary | ICD-10-CM | POA: Insufficient documentation

## 2018-03-15 DIAGNOSIS — R053 Chronic cough: Secondary | ICD-10-CM

## 2018-03-15 NOTE — Progress Notes (Signed)
PFT completed today.  

## 2018-03-15 NOTE — Patient Instructions (Addendum)
Keep up the walking as much as you can and let me know if you're losing ground   In the event of a cough >  mucinex dm up to 1200 mg every 12 hours and watch your diet and we need to see you if you are not getting better after 3-5 days    Pulmonary follow up is as needed

## 2018-03-15 NOTE — Progress Notes (Signed)
Katrina [name], female    DOB: 05-Sep-1955,   MRN: [id number]    Brief patient profile:  59 yowf  Quit smoking 1992 with some cough that resolved s assoc doe or need for any inhalers developed RA followed by Dierdre Forth with good control with mtx then humira added around 2011  With CAP 04/15/15 LLL rx levaquin 100% improvement after several months and fine again until end of June 2019 acute onset  cough slt green mucus with nl cxr 11/18/17 rx levaquin  Mostly better some lingering cough/ fatigue/ sob and then much worse again Sept 5 2019 very similar symptoms  rx levaquin again and still not 100%  And concerned each time she gets"bronchitis" she has to stop her RA meds and then this acts up so referred to pulmonary clinic 01/27/2018 by Dr  Delbert Harness with ? CB    History of Present Illness  01/27/2018  1st pulmonary eval / Joseph Bias  Chief Complaint  Patient presents with  . Pulmonary Consult    Referred by Dr. Marlan Palau. Pt states had cxr in July 2019 and was told she had chronic bronchitis. She has been coughing off and on since June 2019.  Cough is non prod.   Dyspnea:  Min impact on ex tol/ more limited  Fatigue, capacity to do  eliptcal about the same as baseline  Cough: mostly better now, very min productive daytime only  Sleep: intermittently disturbed insomnia / flat in bed with one pillow SABA use: none  rec GERD diet  For cough/ congestion >   mucinex or mucinex DM up to 1200 mg every 12 hours as needed  Please schedule a follow up office visit in 4 weeks, sooner if needed with pfts on return    03/15/2018  f/u ov/Treyshaun Keatts re: RA on mtx / humira  Chief Complaint  Patient presents with  . Follow-up    PFT's done. Her cough has pretty much resolved. No new co's.   Dyspnea: nl pace walk > 30 min some hills s cob  Cough: gone Sleeping: flat/ one pillow  SABA use: none 02: none  No obvious day to day or daytime variability or assoc excess/ purulent sputum or mucus plugs or hemoptysis or cp  or chest tightness, subjective wheeze or overt sinus or hb symptoms.   Sleeping as above without nocturnal  or early am exacerbation  of respiratory  c/o's or need for noct saba. Also denies any obvious fluctuation of symptoms with weather or environmental changes or other aggravating or alleviating factors except as outlined above   No unusual exposure hx or h/o childhood pna/ asthma or knowledge of premature birth.  Current Allergies, Complete Past Medical History, Past Surgical History, Family History, and Social History were reviewed in [employer] record.  ROS  The following are not active complaints unless bolded Hoarseness, sore throat, dysphagia, dental problems, itching, sneezing,  nasal congestion or discharge of excess mucus or purulent secretions, ear ache,   fever, chills, sweats, unintended wt loss or wt gain, classically pleuritic or exertional cp,  orthopnea pnd or arm/hand swelling  or leg swelling, presyncope, palpitations, abdominal pain, anorexia, nausea, vomiting, diarrhea  or change in bowel habits or change in bladder habits, change in stools or change in urine, dysuria, hematuria,  rash, arthralgias, visual complaints, headache, numbness, weakness or ataxia or problems with walking or coordination,  change in mood or  memory.        Current Meds  Medication  Sig  . Ascorbic Acid (VITAMIN C) 1000 MG tablet Take 1,000 mg by mouth daily.  . calcium carbonate (OS-CAL) 600 MG TABS Take 600 mg by mouth 2 (two) times daily with a meal.    . cholecalciferol (VITAMIN D) 1000 UNITS tablet Take by mouth daily.    Marland Kitchen FOLIC ACID PO Take 1,200 mg by mouth daily.  Marland Kitchen HUMIRA PEN 40 MG/0.8ML PNKT   . methotrexate (RHEUMATREX) 2.5 MG tablet Take 15 mg by mouth once a week. Caution:Chemotherapy. Protect from light.                       Objective:     amb wf nad  Wt Readings from Last 3 Encounters:  03/15/18 139 lb 12.8 oz (63.4 kg)  01/27/18 139 lb 3.2  oz (63.1 kg)  01/10/18 147 lb (66.7 kg)     Vital signs reviewed - Note on arrival 02 sats  99% on RA       HEENT: nl dentition, turbinates bilaterally, and oropharynx. Nl external ear canals without cough reflex   NECK :  without JVD/Nodes/TM/ nl carotid upstrokes bilaterally   LUNGS: no acc muscle use,  Nl contour chest which is clear to A and P bilaterally without cough on insp or exp maneuvers   CV:  RRR  no s3 or murmur or increase in P2, and no edema   ABD:  soft and nontender with nl inspiratory excursion in the supine position. No bruits or organomegaly appreciated, bowel sounds nl  MS:  Nl gait/ ext warm without deformities, calf tenderness, cyanosis or clubbing No obvious joint restrictions   SKIN: warm and dry without lesions    NEURO:  alert, approp, nl sensorium with  no motor or cerebellar deficits apparent.                Assessment

## 2018-03-15 NOTE — Assessment & Plan Note (Signed)
PFT's  03/15/2018  FEV1 1.75 (75 % ) ratio 65  p 9 % improvement from saba p nothing prior to study with DLCO not able to perform   I had an extended final summary discussion with the patient reviewing all relevant studies completed to date and  lasting 15 to 20 minutes of a 25 minute visit on the following issues:   The distinction between the three entities that  could explain her pft findings and tendency to recurrent pneumonia is a moot point for now but I would have a low threshold to do HRCT chest if symptoms recur vs empirically start low dose laba/ics (eg symbicort 80 2bid)  for any flare of cough /wheeze/sob  in future and of course keep up with rx by pcp for vaccinations and rheumatology f/u  for RA control over the long run  Each maintenance medication was reviewed in detail including most importantly the difference between maintenance and as needed and under what circumstances the prns are to be used.  Please see AVS for specific  Instructions which are unique to this visit and I personally typed out  which were reviewed in detail in writing with the patient and a copy provided.    Pulmonary f/u can be prn at this point as she has not active symptoms on no resp rx at all.

## 2018-03-17 LAB — PULMONARY FUNCTION TEST
FEF 25-75 Post: 1.01 L/sec
FEF 25-75 Pre: 1.18 L/sec
FEF2575-%Change-Post: -14 %
FEF2575-%Pred-Post: 47 %
FEF2575-%Pred-Pre: 55 %
FEV1-%Change-Post: 9 %
FEV1-%Pred-Post: 75 %
FEV1-%Pred-Pre: 69 %
FEV1-Post: 1.75 L
FEV1-Pre: 1.6 L
FEV1FVC-%CHANGE-POST: 23 %
FEV1FVC-%Pred-Pre: 68 %
FEV6-%CHANGE-POST: -6 %
FEV6-%PRED-PRE: 98 %
FEV6-%Pred-Post: 92 %
FEV6-POST: 2.68 L
FEV6-PRE: 2.85 L
FEV6FVC-%PRED-POST: 103 %
FEV6FVC-%PRED-PRE: 103 %
FVC-%Change-Post: -11 %
FVC-%PRED-PRE: 100 %
FVC-%Pred-Post: 89 %
FVC-POST: 2.69 L
FVC-PRE: 3.03 L
POST FEV6/FVC RATIO: 100 %
Post FEV1/FVC ratio: 65 %
Pre FEV1/FVC ratio: 53 %
Pre FEV6/FVC Ratio: 100 %
RV % pred: 102 %
RV: 2.01 L
TLC % PRED: 88 %
TLC: 4.26 L

## 2018-05-19 DIAGNOSIS — M0579 Rheumatoid arthritis with rheumatoid factor of multiple sites without organ or systems involvement: Secondary | ICD-10-CM | POA: Diagnosis not present

## 2018-08-16 DIAGNOSIS — M150 Primary generalized (osteo)arthritis: Secondary | ICD-10-CM | POA: Diagnosis not present

## 2018-08-16 DIAGNOSIS — M0579 Rheumatoid arthritis with rheumatoid factor of multiple sites without organ or systems involvement: Secondary | ICD-10-CM | POA: Diagnosis not present

## 2018-08-16 DIAGNOSIS — M810 Age-related osteoporosis without current pathological fracture: Secondary | ICD-10-CM | POA: Diagnosis not present

## 2018-08-18 DIAGNOSIS — M0579 Rheumatoid arthritis with rheumatoid factor of multiple sites without organ or systems involvement: Secondary | ICD-10-CM | POA: Diagnosis not present

## 2018-08-25 ENCOUNTER — Ambulatory Visit: Payer: BLUE CROSS/BLUE SHIELD | Admitting: Obstetrics and Gynecology

## 2018-11-15 DIAGNOSIS — M150 Primary generalized (osteo)arthritis: Secondary | ICD-10-CM | POA: Diagnosis not present

## 2018-11-15 DIAGNOSIS — M0579 Rheumatoid arthritis with rheumatoid factor of multiple sites without organ or systems involvement: Secondary | ICD-10-CM | POA: Diagnosis not present

## 2018-11-15 DIAGNOSIS — M810 Age-related osteoporosis without current pathological fracture: Secondary | ICD-10-CM | POA: Diagnosis not present

## 2018-12-13 DIAGNOSIS — H6123 Impacted cerumen, bilateral: Secondary | ICD-10-CM | POA: Diagnosis not present

## 2018-12-13 DIAGNOSIS — H60333 Swimmer's ear, bilateral: Secondary | ICD-10-CM | POA: Diagnosis not present

## 2018-12-19 DIAGNOSIS — M79672 Pain in left foot: Secondary | ICD-10-CM | POA: Diagnosis not present

## 2018-12-26 DIAGNOSIS — S92525D Nondisplaced fracture of medial phalanx of left lesser toe(s), subsequent encounter for fracture with routine healing: Secondary | ICD-10-CM | POA: Diagnosis not present

## 2019-01-12 DIAGNOSIS — S92525D Nondisplaced fracture of medial phalanx of left lesser toe(s), subsequent encounter for fracture with routine healing: Secondary | ICD-10-CM | POA: Diagnosis not present

## 2019-01-17 ENCOUNTER — Telehealth: Payer: Self-pay | Admitting: Internal Medicine

## 2019-01-17 NOTE — Telephone Encounter (Signed)
OK she can call us from her car but has to stay outside for 10 minutes after shot to make sure she does not have reaction before leaving. Has to call and let us know she is leaving.

## 2019-01-17 NOTE — Telephone Encounter (Signed)
Called to let patient know we would do from the car, she has appointment scheduled with GYN on 02/01/19, she is going to check and see if they will do flu shot if so, she will get it there. Then she schedule CPE

## 2019-01-17 NOTE — Telephone Encounter (Signed)
Katrina [name] [phone]  Katrina [name] called to say she would like to get her Flu Shot, but she does not want to come in office to get. She would like to get it outside.

## 2019-01-30 ENCOUNTER — Telehealth: Payer: Self-pay | Admitting: Internal Medicine

## 2019-01-30 ENCOUNTER — Other Ambulatory Visit: Payer: Self-pay

## 2019-01-30 DIAGNOSIS — H52203 Unspecified astigmatism, bilateral: Secondary | ICD-10-CM | POA: Diagnosis not present

## 2019-01-30 DIAGNOSIS — H40033 Anatomical narrow angle, bilateral: Secondary | ICD-10-CM | POA: Diagnosis not present

## 2019-01-30 DIAGNOSIS — H2513 Age-related nuclear cataract, bilateral: Secondary | ICD-10-CM | POA: Diagnosis not present

## 2019-01-30 NOTE — Telephone Encounter (Signed)
Pt called and said that she is on 2 supressent drugs and thats and wanted to know which Flu shot she should take

## 2019-01-30 NOTE — Telephone Encounter (Signed)
She should call Rheumatologist. We do not stock high dose.

## 2019-01-31 NOTE — Progress Notes (Signed)
63 y.o. G24P0010 Married Caucasian female here for annual exam.    No vaginal bleeding, pain, or discharge.  No sexually active with any activity for years. She and her partner are Ok with this.   Broke a toe in August, and recovering from it.   PCP:  Luanna Cole.Baxley, MD   Patient's last menstrual period was 05/04/2002 (approximate).           Sexually active: No.  The current method of family planning is post menopausal status.    Exercising: Yes.    walking, yoga and aerobics Smoker:  Former  Health Maintenance: Pap: 07-21-17 Neg:Neg HR HPV,04-10-15 Neg:Neg HR HPV.  04/05/14 - normal pap.  History of abnormal Pap:  no MMG:  07-22-17 Neg/density B/BiRads1--pt.knows need to schedule Colonoscopy:  04-19-14 normal in [place];next due 04/2024 BMD: 07-29-16 Result :Osteoporosis--Rheumatologist follows TDaP: 04-17-10 Gardasil:   n/a HIV:Neg per patient Hep C:Neg per patient Screening Labs:  PCP and Rheumatology.    reports that she quit smoking about 28 years ago. Her smoking use included cigarettes. She has a 20.00 pack-year smoking history. She has never used smokeless tobacco. She reports that she does not drink alcohol or use drugs.  Past Medical History:  Diagnosis Date  . Arthritis    --rheumatoid  . Osteoporosis 04/05/14   T score -2.6 spine  . Salpingitis    Had appendectomy at same time as salpingitis dx was made.    Past Surgical History:  Procedure Laterality Date  . APPENDECTOMY    . BREAST EXCISIONAL BIOPSY Left   . left breast biopsy    . TONSILLECTOMY AND ADENOIDECTOMY  2014    Current Outpatient Medications  Medication Sig Dispense Refill  . Ascorbic Acid (VITAMIN C) 1000 MG tablet Take 1,000 mg by mouth daily.    . calcium carbonate (OS-CAL) 600 MG TABS Take 600 mg by mouth 2 (two) times daily with a meal.      . cholecalciferol (VITAMIN D) 1000 UNITS tablet Take by mouth daily.      Marland Kitchen FOLIC ACID PO Take 1,200 mg by mouth daily.    Marland Kitchen HUMIRA PEN 40 MG/0.8ML  PNKT   3  . methotrexate (RHEUMATREX) 2.5 MG tablet Take 15 mg by mouth once a week. Caution:Chemotherapy. Protect from light.     No current facility-administered medications for this visit.     Family History  Problem Relation Age of Onset  . Cancer Mother 73       Dec Lung CA  . Thyroid disease Mother        thyroidectomy in her 20's--unsure of reason  . Cancer Father        Dec Bladder CA age 75  . Heart disease Father   . Hypertension Father   . Hyperlipidemia Father   . Stroke Paternal Grandfather   . Breast cancer Maternal Aunt     Review of Systems  All other systems reviewed and are negative.   Exam:   BP 100/66   Pulse 80   Temp 97.7 F (36.5 C) (Temporal)   Resp 14   Ht 5' 2.5" (1.588 m)   Wt 127 lb (57.6 kg)   LMP 05/04/2002 (Approximate)   BMI 22.86 kg/m     General appearance: alert, cooperative and appears stated age Head: normocephalic, without obvious abnormality, atraumatic Neck: no adenopathy, supple, symmetrical, trachea midline and thyroid normal to inspection and palpation Lungs: clear to auscultation bilaterally Breasts: normal appearance, no masses or tenderness, No nipple  retraction or dimpling, No nipple discharge or bleeding, No axillary adenopathy Heart: regular rate and rhythm Abdomen: soft, non-tender; no masses, no organomegaly Extremities: extremities normal, atraumatic, no cyanosis or edema Skin: skin color, texture, turgor normal. No rashes or lesions Lymph nodes: cervical, supraclavicular, and axillary nodes normal. Neurologic: grossly normal  Pelvic: External genitalia:  no lesions              No abnormal inguinal nodes palpated.              Urethra:  normal appearing urethra with no masses, tenderness or lesions              Bartholins and Skenes: normal                 Vagina: normal appearing vagina with normal color and discharge, no lesions              Cervix: no lesions              Pap taken: No. Bimanual Exam:   Uterus:  normal size, contour, position, consistency, mobility, non-tender              Adnexa: no mass, fullness, tenderness              Rectal exam: Yes.  .  Confirms.              Anus:  normal sphincter tone, no lesions.  Internal hemorrhoid.   Chaperone was present for exam.  Assessment:   Well woman visit with normal exam. Osteoporosis.   PCP and Rheumatologist following.  Rheumatoid arthritis. On Humira and MTX. Remote hx of abnormal pap.  Hx salpingitis.  Hx left breast biopsy.  Plan: Mammogram screening discussed. Self breast awareness reviewed. Pap and HR HPV not done today.   Due again in 2024.  She declines but will check with her insurance company.   New ASCCP guidelines reviewed. Guidelines for Calcium, Vitamin D, regular exercise program including cardiovascular and weight bearing exercise. Flu vaccine.  Labs with PCP and rheumatology.  Follow up annually and prn.   After visit summary provided.

## 2019-01-31 NOTE — Telephone Encounter (Signed)
Patient was notified.

## 2019-02-01 ENCOUNTER — Other Ambulatory Visit: Payer: Self-pay

## 2019-02-01 ENCOUNTER — Ambulatory Visit (INDEPENDENT_AMBULATORY_CARE_PROVIDER_SITE_OTHER): Payer: BC Managed Care – PPO | Admitting: Obstetrics and Gynecology

## 2019-02-01 ENCOUNTER — Encounter: Payer: Self-pay | Admitting: Obstetrics and Gynecology

## 2019-02-01 VITALS — BP 100/66 | HR 80 | Temp 97.7°F | Resp 14 | Ht 62.5 in | Wt 127.0 lb

## 2019-02-01 DIAGNOSIS — Z01419 Encounter for gynecological examination (general) (routine) without abnormal findings: Secondary | ICD-10-CM | POA: Diagnosis not present

## 2019-02-01 NOTE — Patient Instructions (Signed)

## 2019-02-02 ENCOUNTER — Other Ambulatory Visit: Payer: Self-pay | Admitting: Internal Medicine

## 2019-02-02 DIAGNOSIS — Z1231 Encounter for screening mammogram for malignant neoplasm of breast: Secondary | ICD-10-CM

## 2019-02-06 ENCOUNTER — Other Ambulatory Visit: Payer: Self-pay

## 2019-02-06 ENCOUNTER — Ambulatory Visit
Admission: RE | Admit: 2019-02-06 | Discharge: 2019-02-06 | Disposition: A | Discharge status: Home / Self Care | Payer: BC Managed Care – PPO | Source: Ambulatory Visit | Attending: Internal Medicine | Admitting: Internal Medicine

## 2019-02-06 DIAGNOSIS — Z1231 Encounter for screening mammogram for malignant neoplasm of breast: Secondary | ICD-10-CM | POA: Diagnosis not present

## 2019-02-15 DIAGNOSIS — M0579 Rheumatoid arthritis with rheumatoid factor of multiple sites without organ or systems involvement: Secondary | ICD-10-CM | POA: Diagnosis not present

## 2019-02-15 DIAGNOSIS — M810 Age-related osteoporosis without current pathological fracture: Secondary | ICD-10-CM | POA: Diagnosis not present

## 2019-02-15 DIAGNOSIS — M150 Primary generalized (osteo)arthritis: Secondary | ICD-10-CM | POA: Diagnosis not present

## 2019-02-16 ENCOUNTER — Other Ambulatory Visit: Payer: Self-pay | Admitting: Internal Medicine

## 2019-02-16 DIAGNOSIS — M8580 Other specified disorders of bone density and structure, unspecified site: Secondary | ICD-10-CM

## 2019-02-16 DIAGNOSIS — R5381 Other malaise: Secondary | ICD-10-CM

## 2019-04-06 DIAGNOSIS — D225 Melanocytic nevi of trunk: Secondary | ICD-10-CM | POA: Diagnosis not present

## 2019-04-06 DIAGNOSIS — D485 Neoplasm of uncertain behavior of skin: Secondary | ICD-10-CM | POA: Diagnosis not present

## 2019-04-06 DIAGNOSIS — D2261 Melanocytic nevi of right upper limb, including shoulder: Secondary | ICD-10-CM | POA: Diagnosis not present

## 2019-04-06 DIAGNOSIS — Z85828 Personal history of other malignant neoplasm of skin: Secondary | ICD-10-CM | POA: Diagnosis not present

## 2019-04-06 DIAGNOSIS — Z808 Family history of malignant neoplasm of other organs or systems: Secondary | ICD-10-CM | POA: Diagnosis not present

## 2019-04-06 DIAGNOSIS — B079 Viral wart, unspecified: Secondary | ICD-10-CM | POA: Diagnosis not present

## 2019-05-04 ENCOUNTER — Encounter

## 2019-05-08 ENCOUNTER — Other Ambulatory Visit (INDEPENDENT_AMBULATORY_CARE_PROVIDER_SITE_OTHER): Payer: BC Managed Care – PPO | Admitting: Internal Medicine

## 2019-05-08 ENCOUNTER — Other Ambulatory Visit: Payer: Self-pay

## 2019-05-08 ENCOUNTER — Ambulatory Visit
Admission: RE | Admit: 2019-05-08 | Discharge: 2019-05-08 | Disposition: A | Discharge status: Home / Self Care | Payer: BC Managed Care – PPO | Source: Ambulatory Visit | Attending: Internal Medicine | Admitting: Internal Medicine

## 2019-05-08 DIAGNOSIS — M810 Age-related osteoporosis without current pathological fracture: Secondary | ICD-10-CM

## 2019-05-08 DIAGNOSIS — Z780 Asymptomatic menopausal state: Secondary | ICD-10-CM | POA: Diagnosis not present

## 2019-05-08 DIAGNOSIS — Z0000 Encounter for general adult medical examination without abnormal findings: Secondary | ICD-10-CM | POA: Diagnosis not present

## 2019-05-08 DIAGNOSIS — Z1329 Encounter for screening for other suspected endocrine disorder: Secondary | ICD-10-CM

## 2019-05-08 DIAGNOSIS — Z13220 Encounter for screening for lipoid disorders: Secondary | ICD-10-CM | POA: Diagnosis not present

## 2019-05-08 DIAGNOSIS — M8580 Other specified disorders of bone density and structure, unspecified site: Secondary | ICD-10-CM

## 2019-05-08 DIAGNOSIS — J449 Chronic obstructive pulmonary disease, unspecified: Secondary | ICD-10-CM | POA: Diagnosis not present

## 2019-05-08 DIAGNOSIS — M059 Rheumatoid arthritis with rheumatoid factor, unspecified: Secondary | ICD-10-CM

## 2019-05-09 ENCOUNTER — Encounter: Payer: Self-pay | Admitting: Internal Medicine

## 2019-05-09 ENCOUNTER — Ambulatory Visit (INDEPENDENT_AMBULATORY_CARE_PROVIDER_SITE_OTHER): Payer: BC Managed Care – PPO | Admitting: Internal Medicine

## 2019-05-09 VITALS — BP 110/60 | HR 78 | Temp 98.0°F | Ht 62.25 in | Wt 127.0 lb

## 2019-05-09 DIAGNOSIS — M810 Age-related osteoporosis without current pathological fracture: Secondary | ICD-10-CM | POA: Diagnosis not present

## 2019-05-09 DIAGNOSIS — F411 Generalized anxiety disorder: Secondary | ICD-10-CM

## 2019-05-09 DIAGNOSIS — Z0000 Encounter for general adult medical examination without abnormal findings: Secondary | ICD-10-CM | POA: Diagnosis not present

## 2019-05-09 DIAGNOSIS — M0579 Rheumatoid arthritis with rheumatoid factor of multiple sites without organ or systems involvement: Secondary | ICD-10-CM

## 2019-05-09 LAB — COMPLETE METABOLIC PANEL WITH GFR
AG Ratio: 1.6 (calc) (ref 1.0–2.5)
ALT: 22 U/L (ref 6–29)
AST: 22 U/L (ref 10–35)
Albumin: 4.3 g/dL (ref 3.6–5.1)
Alkaline phosphatase (APISO): 77 U/L (ref 37–153)
BUN: 13 mg/dL (ref 7–25)
CO2: 30 mmol/L (ref 20–32)
Calcium: 9.6 mg/dL (ref 8.6–10.4)
Chloride: 104 mmol/L (ref 98–110)
Creat: 0.71 mg/dL (ref 0.50–0.99)
GFR, Est African American: 104 mL/min/{1.73_m2} (ref >=60)
GFR, Est Non African American: 90 mL/min/{1.73_m2} (ref >=60)
Globulin: 2.7 g/dL (calc) (ref 1.9–3.7)
Glucose, Bld: 89 mg/dL (ref 65–99)
Potassium: 4.8 mmol/L (ref 3.5–5.3)
Sodium: 140 mmol/L (ref 135–146)
Total Bilirubin: 0.8 mg/dL (ref 0.2–1.2)
Total Protein: 7 g/dL (ref 6.1–8.1)

## 2019-05-09 LAB — POCT URINALYSIS DIPSTICK
Appearance: NEGATIVE
Bilirubin, UA: NEGATIVE
Blood, UA: NEGATIVE
Glucose, UA: NEGATIVE
Ketones, UA: NEGATIVE
Leukocytes, UA: NEGATIVE
Nitrite, UA: NEGATIVE
Odor: NEGATIVE
Protein, UA: NEGATIVE
Spec Grav, UA: 1.01 (ref 1.010–1.025)
Urobilinogen, UA: 0.2 E.U./dL
pH, UA: 7.5 (ref 5.0–8.0)

## 2019-05-09 LAB — CBC WITH DIFFERENTIAL/PLATELET
Absolute Monocytes: 710 cells/uL (ref 200–950)
Basophils Absolute: 100 cells/uL (ref 0–200)
Basophils Relative: 1.1 %
Eosinophils Absolute: 182 cells/uL (ref 15–500)
Eosinophils Relative: 2 %
HCT: 42 % (ref 35.0–45.0)
Hemoglobin: 14.2 g/dL (ref 11.7–15.5)
Lymphs Abs: 2384 cells/uL (ref 850–3900)
MCH: 33 pg (ref 27.0–33.0)
MCHC: 33.8 g/dL (ref 32.0–36.0)
MCV: 97.7 fL (ref 80.0–100.0)
MPV: 9.4 fL (ref 7.5–12.5)
Monocytes Relative: 7.8 %
Neutro Abs: 5724 cells/uL (ref 1500–7800)
Neutrophils Relative %: 62.9 %
Platelets: 282 10*3/uL (ref 140–400)
RBC: 4.3 10*6/uL (ref 3.80–5.10)
RDW: 12.5 % (ref 11.0–15.0)
Total Lymphocyte: 26.2 %
WBC: 9.1 10*3/uL (ref 3.8–10.8)

## 2019-05-09 LAB — LIPID PANEL
Cholesterol: 193 mg/dL (ref <200)
HDL: 89 mg/dL (ref >=50)
LDL Cholesterol (Calc): 86 mg/dL (calc)
Non-HDL Cholesterol (Calc): 104 mg/dL (calc) (ref <130)
Total CHOL/HDL Ratio: 2.2 (calc) (ref <5.0)
Triglycerides: 87 mg/dL (ref <150)

## 2019-05-09 LAB — TSH: TSH: 2.71 mIU/L (ref 0.40–4.50)

## 2019-05-09 MED ORDER — ALPRAZOLAM 0.25 MG PO TABS: 0.2500 mg | ORAL_TABLET | Freq: Two times a day (BID) | ORAL | 0 refills | Status: DC | PRN

## 2019-05-09 NOTE — Progress Notes (Signed)
Subjective:    Patient ID: Katrina [name], female    DOB: 01/28/1956, 64 y.o.   MRN: [id number]  HPI 64 year old Female with history of rheumatoid arthritis treated by rheumatologist in today for health maintenance exam and evaluation of medical issues.  Has been treated with Humira and methotrexate with good success.    In 2017 she had left lower lobe pneumonia.  Dr. Conley Simmonds is  GYN physician.  Is on  for rheumatoid arthritis.  Is CCP positive.  Took Fosamax for 5 years by GYN and stopped in 2012.  Has osteoporosis with bone density study January 2021 showing T score of -2.8 and the LS-spine and -2.6 in the right femoral neck.  Rheumatologist may be considering Prolia for her.  Past medical history tonsillectomy 2014, appendectomy 1976  In September 2020 she had closed fracture of phalanx of left foot treated by orthopedist.  Has polyarthritis involving hands wrists shoulders knees ankles and toes.  History of fractured third metatarsal 2008.  History of right trigger finger release 2008.  Appendectomy 1976.  Colonoscopy 2015 with 10-year follow-up recommended.  Tetanus immunization given December 2011.  Tonsillectomy in 2014 after being diagnosed with a peritonsillar abscess.  Surgery was done by Dr. Narda Bonds.  No known drug allergies.  Social history: Does not smoke or consume alcohol.  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  She and her husband have spent considerable time sailing.  No children.  Family history: Mother died with lung cancer.  Father with history of hypertension died at age 54 with history of congestive heart failure, bladder cancer, macular degeneration.    Review of Systems  Respiratory: Negative.   Cardiovascular: Negative.   Gastrointestinal: Negative.   Genitourinary: Negative.   Neurological: Negative.   Psychiatric/Behavioral: Negative.        Objective:   Physical Exam Constitutional:      Appearance: Normal  appearance.  HENT:     Right Ear: Tympanic membrane normal.     Left Ear: Tympanic membrane normal.     Nose: Nose normal.  Eyes:     Extraocular Movements: Extraocular movements intact.     Conjunctiva/sclera: Conjunctivae normal.     Pupils: Pupils are equal, round, and reactive to light.  Neck:     Comments: No thyromegaly or adenopathy Cardiovascular:     Rate and Rhythm: Normal rate and regular rhythm.     Heart sounds: Normal heart sounds. No murmur.     Comments: Breast without masses Pulmonary:     Effort: Pulmonary effort is normal.     Breath sounds: Normal breath sounds. No wheezing or rales.  Abdominal:     General: Bowel sounds are normal.     Palpations: Abdomen is soft. There is no mass.     Tenderness: There is no abdominal tenderness. There is no guarding.  Genitourinary:    Comments: Deferred to GYN Musculoskeletal:     Cervical back: Neck supple.     Right lower leg: No edema.     Left lower leg: No edema.  Skin:    General: Skin is warm.  Neurological:     General: No focal deficit present.     Mental Status: She is alert and oriented to person, place, and time.     Cranial Nerves: No cranial nerve deficit.     Coordination: Coordination normal.  Psychiatric:        Mood and Affect: Mood  normal.        Behavior: Behavior normal.        Thought Content: Thought content normal.        Judgment: Judgment normal.           Assessment & Plan:  Rheumatoid arthritis with positive CCP stable on methotrexate and Humira and followed by rheumatologist  Osteoporosis-consider Prolia but will leave decision up to her rheumatologist  Recent fractured left foot phalanx seen by orthopedist  Remote history of left lower lobe pneumonia 2017  History of peritonsillar abscess status post tonsillectomy 2014  Anxiety-given prescription for Xanax to take sparingly if needed due to situational stress.  Her labs are reviewed and are entirely within normal limits.   Return in 1 year or as needed.  Due for tetanus immunization December 2021.

## 2019-05-09 NOTE — Patient Instructions (Signed)
Take Xanax sparingly for anxiety up to twice daily if needed. It was a pleasure to see you today. RTC one year or as needed.

## 2019-05-25 DIAGNOSIS — M0579 Rheumatoid arthritis with rheumatoid factor of multiple sites without organ or systems involvement: Secondary | ICD-10-CM | POA: Diagnosis not present

## 2019-07-17 ENCOUNTER — Telehealth: Payer: Self-pay | Admitting: Internal Medicine

## 2019-07-17 NOTE — Telephone Encounter (Signed)
See tomorrow

## 2019-07-17 NOTE — Telephone Encounter (Signed)
Appointment scheduled.

## 2019-07-17 NOTE — Telephone Encounter (Signed)
Katrina [name] [phone]  [name] called to say last Monday she started having some vertigo, nausea, it go better, but now has come back. No COVID exposure, No fever that she knows of, no other symptoms. Would like to come in and be seen.

## 2019-07-18 ENCOUNTER — Encounter: Payer: Self-pay | Admitting: Internal Medicine

## 2019-07-18 ENCOUNTER — Other Ambulatory Visit: Payer: Self-pay

## 2019-07-18 ENCOUNTER — Ambulatory Visit (INDEPENDENT_AMBULATORY_CARE_PROVIDER_SITE_OTHER): Payer: BC Managed Care – PPO | Admitting: Internal Medicine

## 2019-07-18 VITALS — BP 120/78 | HR 73 | Temp 97.8°F | Ht 62.25 in | Wt 127.0 lb

## 2019-07-18 DIAGNOSIS — H8111 Benign paroxysmal vertigo, right ear: Secondary | ICD-10-CM

## 2019-07-18 DIAGNOSIS — M0579 Rheumatoid arthritis with rheumatoid factor of multiple sites without organ or systems involvement: Secondary | ICD-10-CM

## 2019-07-18 MED ORDER — ALPRAZOLAM 0.25 MG PO TABS: ORAL_TABLET | ORAL | 1 refills | Status: DC

## 2019-07-18 NOTE — Progress Notes (Signed)
   Subjective:    Patient ID: Katrina [name], female    DOB: 1955/10/11, 64 y.o.   MRN: [id number]  HPI Had onset room spinning dizziness Monday march 15 and stayed in bed all day. Had some nausea. No history of URI symptoms recently. No headache. No fever or chills. Had one Covid-19 vaccine March 11. History of Rheumatoid arthritis.Takes DMARD. No tinnitus. No significant headache.    Review of Systems hx vertigo past 4 or 5 years and generally  2-3 episodes per year- usually lasting a couple of days but not severe. 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     Objective:   Physical Exam BP 120/78 pulse 73 temperature 97.8 degrees pulse oximetry 97%; Weight 127 pounds, BMI 23.04.  Skin warm and dry.  No cervical adenopathy.  TMs are clear.  She has rightward nystagmus.  Extraocular movements are full.  PERRLA.  No facial weakness.  Muscle strength is normal.  No focal deficits on brief neurological exam except for nystagmus rightward. No facial weakness.      Assessment & Plan:  Benign positional vertigo-explained to patient that we did not know the exact trigger at this point in time but it tends to be recurrent and she has been having episodes intermittently nail or a few years.  She is asking about an Epley maneuver.  I think that she is improving and I do not see the necessity of Epley maneuver at this point in time.  I generally refer to ENT or Neuro PT for that maneuver.  She does have some Xanax on hand and may try taking 0.25 mg up to 3 times a day as needed for dizziness.  The alternative would be meclizine which she has taken previously when burning and did not like the way it made her feel.  Explained the symptoms generally last 7 to 10 days in retrospect was having some symptoms last week.

## 2019-07-18 NOTE — Patient Instructions (Signed)
Take Xanax 0.25 mg up to 3 times a day as needed for vertigo.  Call if not better in 7 to 10 days or sooner if worse.

## 2019-08-15 DIAGNOSIS — S61310A Laceration without foreign body of right index finger with damage to nail, initial encounter: Secondary | ICD-10-CM | POA: Diagnosis not present

## 2019-08-16 DIAGNOSIS — M810 Age-related osteoporosis without current pathological fracture: Secondary | ICD-10-CM | POA: Diagnosis not present

## 2019-08-16 DIAGNOSIS — M150 Primary generalized (osteo)arthritis: Secondary | ICD-10-CM | POA: Diagnosis not present

## 2019-08-16 DIAGNOSIS — M0579 Rheumatoid arthritis with rheumatoid factor of multiple sites without organ or systems involvement: Secondary | ICD-10-CM | POA: Diagnosis not present

## 2019-08-29 DIAGNOSIS — D692 Other nonthrombocytopenic purpura: Secondary | ICD-10-CM | POA: Diagnosis not present

## 2019-08-30 DIAGNOSIS — M150 Primary generalized (osteo)arthritis: Secondary | ICD-10-CM | POA: Diagnosis not present

## 2019-08-31 DIAGNOSIS — S61310A Laceration without foreign body of right index finger with damage to nail, initial encounter: Secondary | ICD-10-CM | POA: Diagnosis not present

## 2019-11-15 DIAGNOSIS — M0579 Rheumatoid arthritis with rheumatoid factor of multiple sites without organ or systems involvement: Secondary | ICD-10-CM | POA: Diagnosis not present

## 2019-11-15 DIAGNOSIS — Z79899 Other long term (current) drug therapy: Secondary | ICD-10-CM | POA: Diagnosis not present

## 2019-11-15 DIAGNOSIS — Z20828 Contact with and (suspected) exposure to other viral communicable diseases: Secondary | ICD-10-CM | POA: Diagnosis not present

## 2020-01-12 ENCOUNTER — Other Ambulatory Visit: Payer: Self-pay | Admitting: Internal Medicine

## 2020-01-12 DIAGNOSIS — Z1231 Encounter for screening mammogram for malignant neoplasm of breast: Secondary | ICD-10-CM

## 2020-02-06 ENCOUNTER — Encounter: Payer: Self-pay | Admitting: Obstetrics and Gynecology

## 2020-02-06 ENCOUNTER — Other Ambulatory Visit: Payer: Self-pay

## 2020-02-06 ENCOUNTER — Other Ambulatory Visit (HOSPITAL_COMMUNITY)
Admission: RE | Admit: 2020-02-06 | Discharge: 2020-02-06 | Disposition: A | Discharge status: Home / Self Care | Payer: Medicare Other | Source: Ambulatory Visit | Attending: Obstetrics and Gynecology | Admitting: Obstetrics and Gynecology

## 2020-02-06 ENCOUNTER — Ambulatory Visit (INDEPENDENT_AMBULATORY_CARE_PROVIDER_SITE_OTHER): Payer: Medicare Other | Admitting: Obstetrics and Gynecology

## 2020-02-06 VITALS — BP 108/64 | HR 66 | Resp 14 | Ht 62.25 in | Wt 132.6 lb

## 2020-02-06 DIAGNOSIS — K649 Unspecified hemorrhoids: Secondary | ICD-10-CM | POA: Diagnosis not present

## 2020-02-06 DIAGNOSIS — Z01419 Encounter for gynecological examination (general) (routine) without abnormal findings: Secondary | ICD-10-CM | POA: Insufficient documentation

## 2020-02-06 NOTE — Patient Instructions (Signed)

## 2020-02-06 NOTE — Progress Notes (Signed)
64 y.o. [id number] Married Caucasian female here for annual exam.    Has a hemorrhoid which bleeds occasionally for a few years.   Now on Prolia for osteoporosis.   Will do her booster Covid vaccine tomorrow.  Had her flu vaccine.  Will do her Shingrix vaccine in the future.  PCP:  Luanna Cole.Baxley, MD   Patient's last menstrual period was 05/04/2002 (approximate).           Sexually active: No.  The current method of family planning is post menopausal status.    Exercising: Yes.    walking yoga and swimmiing Smoker:  Former  Health Maintenance: Pap: 07-21-17 Neg:Neg HR HPV,04-10-15 Neg:Neg HR HPV.  04/05/14 - normal pap.  History of abnormal Pap:  no MMG: 02-06-19  3D/Neg/density C/Birads1--pt. Has appt. Colonoscopy: 7-8 years normal per patient;next due 3 years BMD: 05-08-19  Result :Osteoporosis TDaP: ~2016 with PCP Gardasil:   no HIV:Neg in the past per patient Hep C:Neg in the past per patient Screening Labs:  PCP.   reports that she quit smoking about 29 years ago. Her smoking use included cigarettes. She has a 20.00 pack-year smoking history. She has never used smokeless tobacco. She reports that she does not drink alcohol and does not use drugs.  Past Medical History:  Diagnosis Date  . Arthritis    --rheumatoid  . Osteoporosis 04/05/14   T score -2.6 spine  . Salpingitis    Had appendectomy at same time as salpingitis dx was made.    Past Surgical History:  Procedure Laterality Date  . APPENDECTOMY    . BREAST EXCISIONAL BIOPSY Left   . left breast biopsy    . TONSILLECTOMY AND ADENOIDECTOMY  2014    Current Outpatient Medications  Medication Sig Dispense Refill  . Adalimumab (HUMIRA PEN) 40 MG/0.4ML PNKT     . ALPRAZolam (XANAX) 0.25 MG tablet One po 3 times daily as needed for anxiety and vertigo 90 tablet 1  . Ascorbic Acid (VITAMIN C) 1000 MG tablet Take 1,000 mg by mouth daily.    . calcium carbonate (OS-CAL) 600 MG TABS Take 600 mg by mouth 2 (two) times  daily with a meal.      . cholecalciferol (VITAMIN D) 1000 UNITS tablet Take by mouth daily.      Marland Kitchen denosumab (PROLIA) 60 MG/ML SOSY injection Inject 60 mg into the skin every 6 (six) months.    . FOLIC ACID PO Take 1,200 mg by mouth daily.    . meloxicam (MOBIC) 15 MG tablet Take 1 tablet by mouth as needed.    . methotrexate (RHEUMATREX) 2.5 MG tablet Take 15 mg by mouth once a week. Caution:Chemotherapy. Protect from light.     No current facility-administered medications for this visit.    Family History  Problem Relation Age of Onset  . Cancer Mother 78       Dec Lung CA  . Thyroid disease Mother        thyroidectomy in her 20's--unsure of reason  . Cancer Father        Dec Bladder CA age 25  . Heart disease Father   . Hypertension Father   . Hyperlipidemia Father   . Stroke Paternal Grandfather   . Breast cancer Maternal Aunt     Review of Systems  All other systems reviewed and are negative.   Exam:   BP 108/64   Pulse 66   Resp 14   Ht 5' 2.25" (1.581 m)  Wt 132 lb 9.6 oz (60.1 kg)   LMP 05/04/2002 (Approximate)   BMI 24.06 kg/m     General appearance: alert, cooperative and appears stated age Head: normocephalic, without obvious abnormality, atraumatic Neck: no adenopathy, supple, symmetrical, trachea midline and thyroid normal to inspection and palpation Lungs: clear to auscultation bilaterally Breasts: normal appearance, no masses or tenderness, No nipple retraction or dimpling, No nipple discharge or bleeding, No axillary adenopathy Heart: regular rate and rhythm Abdomen: soft, non-tender; no masses, no organomegaly Extremities: extremities normal, atraumatic, no cyanosis or edema Skin: skin color, texture, turgor normal. No rashes or lesions Lymph nodes: cervical, supraclavicular, and axillary nodes normal. Neurologic: grossly normal  Pelvic: External genitalia:  no lesions              No abnormal inguinal nodes palpated.              Urethra:   normal appearing urethra with no masses, tenderness or lesions              Bartholins and Skenes: normal                 Vagina: normal appearing vagina with normal color and discharge, no lesions              Cervix: no lesions              Pap taken: Yes.   Bimanual Exam:  Uterus:  normal size, contour, position, consistency, mobility, non-tender              Adnexa: no mass, fullness, tenderness              Rectal exam: Yes.  .  Confirms.              Anus:  normal sphincter tone, no lesions  Chaperone was present for exam.  Assessment:   Well woman visit with normal exam. Osteoporosis.PCP and Rheumatologist following. On Prolia.  Rheumatoid arthritis.On Humira and MTX. Remote hx of abnormal pap.  Hx salpingitis.  Hx left breast biopsy. Rectal bleeding.  Hx hemorrhoid.   Plan: Mammogram screening discussed. Self breast awareness reviewed. Pap and HR HPV as above. Guidelines for Calcium, Vitamin D, regular exercise program including cardiovascular and weight bearing exercise. Referral to GI.  Follow up annually and prn.   After visit summary provided.

## 2020-02-07 ENCOUNTER — Ambulatory Visit: Payer: BC Managed Care – PPO

## 2020-02-07 LAB — CYTOLOGY - PAP: Diagnosis: NEGATIVE

## 2020-02-19 ENCOUNTER — Ambulatory Visit: Payer: Self-pay

## 2020-03-04 ENCOUNTER — Telehealth: Payer: Self-pay

## 2020-03-04 ENCOUNTER — Encounter: Payer: Self-pay | Admitting: Gastroenterology

## 2020-03-04 NOTE — Telephone Encounter (Signed)
Routing to [facility] for update on referral to Dr Orvan Falconer

## 2020-03-04 NOTE — Telephone Encounter (Signed)
Patient is following up on a referral to a gastroenterologist.

## 2020-03-07 ENCOUNTER — Ambulatory Visit: Payer: Self-pay

## 2020-03-15 NOTE — Telephone Encounter (Signed)
Per review of Epic, patient scheduled for 04/25/2020. Encounter closed.

## 2020-03-26 ENCOUNTER — Ambulatory Visit: Payer: Self-pay

## 2020-04-12 ENCOUNTER — Ambulatory Visit: Payer: Self-pay

## 2020-04-25 ENCOUNTER — Encounter: Payer: Self-pay | Admitting: Gastroenterology

## 2020-04-25 ENCOUNTER — Ambulatory Visit (INDEPENDENT_AMBULATORY_CARE_PROVIDER_SITE_OTHER): Payer: Medicare Other | Admitting: Gastroenterology

## 2020-04-25 VITALS — BP 112/60 | HR 69 | Ht 62.25 in | Wt 135.0 lb

## 2020-04-25 DIAGNOSIS — K625 Hemorrhage of anus and rectum: Secondary | ICD-10-CM

## 2020-04-25 DIAGNOSIS — K5900 Constipation, unspecified: Secondary | ICD-10-CM

## 2020-04-25 MED ORDER — NA SULFATE-K SULFATE-MG SULF 17.5-3.13-1.6 GM/177ML PO SOLN: 1.0000 | Freq: Once | ORAL | 0 refills | Status: AC

## 2020-04-25 NOTE — Patient Instructions (Addendum)
If you are age 65 or older, your body mass index should be between 23-30. Your Body mass index is 24.49 kg/m. If this is out of the aforementioned range listed, please consider follow up with your Primary Care Provider.  You have been scheduled for a colonoscopy. Please follow written instructions given to you at your visit today.  Please pick up your prep supplies at the pharmacy within the next 1-3 days. If you use inhalers (even only as needed), please bring them with you on the day of your procedure.  It was a pleasure to meet you today.   I recommend the following:  - Follow a high fiber diet including fruits, vegetables, and bran fibers. - Drink at least 64 ounces of water daily. - Continue to exercise regularly.  - Schedule a colonoscopy to confirm the diagnosis and evaluate for other etiologies - Consider using Anusol HC suppositories if the symptoms become more worrisome to you - Use Metamucil or a similar stool bulking agent (Benefiber or Citrucel) to insure soft, formed stools every day. - Use Miralax if the Metamucil is not providing enough relief.   - Mygi[employer].com and UpToDate.com have good information about hemorrhoids.  Tips for colonoscopy:  - Stay well hydrated for 3-4 days prior to the exam. This reduces nausea and dehydration.  - To prevent skin/hemorrhoid irritation - prior to wiping, put A&Dointment or vaseline on the toilet paper. - Keep a towel or pad on the bed.  - Drink  64oz of clear liquids in the morning of prep day (prior to starting the prep) to be sure that there is enough fluid to flush the colon and stay hydrated!!!! This is in addition to the fluids required for preparation. - Use of a flavored hard candy, such as grape Rubin Payor, can counteract some of the flavor of the prep and may prevent some nausea.   Thank you for entrusting me with your care and choosing [facility].  Dr. Orvan Falconer

## 2020-04-25 NOTE — Progress Notes (Signed)
Referring Provider: Aundria Rud* Primary Care Physician:  Elby Showers, MD  Reason for Consultation:  Constipation and hemorrhoids   IMPRESSION:  Rectal bleeding previously attributed to internal hemorrhoids    - no associated anemia Chronic constipation No hemorrhoids noted on screening colonoscopy report from 2015 in [place] No known family history of colon cancer or polyps  The differential for rectal bleeding is broad. Suspected internal hemorrhoids. However, the differential also includes other outlet sources such as fissure, as well as polyps, mass, ulcers, and given her history of rheumatoid arthritis also colitis.  Given this differential I am recommending a colonoscopy.  Constipation may be slow transit constipation. No classic symptoms with pelvic floor dyssnergia. Discussed safety of long term strategies that include diet, stool bulking agents, and Miralax.    PLAN: - High fiber diet, drink at least 64 ounces of water daily, continue regular exercise - Daily stool bulking agent with psyllium or methylcellulose +/- Miralax - Declined Anusol HC suppositories today - Colonoscopy recommended evaluate and exclude other etiologies  Please see the "Patient Instructions" section for addition details about the plan.  HPI: Katrina [name] is a 64 y.o. female referred by Dr. Quincy Simmonds for further evaluation of constipation and hemorrhoids.  The history is obtained through the patient and review of her electronic health record.  She has osteoporosis and rheumatoid arthritis on Humira and methotrexate.  She had left lower lobe pneumonia in 2017.  Appendectomy 1976.  Intermittent rectal bleeding for a few years attributed to a hemorrhoid on exam by Dr. Renold Genta. Recommended Miralax but she was afraid to use it daily due to dependence. Has since been using it 3-4 times a month. Diagnosis confirmed by Dr. Quincy Simmonds on exam last years.   Bleeding has been occurring more  regularly over the last year. Now occurring every few weeks. Sees blood on the toilet paper and in the bowel. No associated rectal pain or tenesmus. Has chronic constipation with a bowel movement every 4-5 days. Intermittent straining and sense of incomplete evacuation. No manual assistance with defecation. No abdominal pain or change change in bowel habits. Will have the urge but can't initiate the defecation.  Tries to drink water and will use the Miralax every 5-6 days.  Regular exercise with virtual yoga.   She had a colonoscopy at the [facility] with Dr. Ferdinand Lango 04/19/2014. Had polyp removed that was a "lymphoid." 10-year follow-up recommended. No hemorrhoids mentioned in the procedure note.   Labs 05/08/2019 included a normal comprehensive metabolic panel and CBC  No known family history of colon cancer or polyps.    Past Medical History:  Diagnosis Date  . Arthritis    --rheumatoid  . Colon polyp   . Osteoporosis 04/05/14   T score -2.6 spine  . Salpingitis    Had appendectomy at same time as salpingitis dx was made.    Past Surgical History:  Procedure Laterality Date  . APPENDECTOMY    . BREAST EXCISIONAL BIOPSY Left   . left breast biopsy    . POLYPECTOMY    . TONSILLECTOMY AND ADENOIDECTOMY  2014    Current Outpatient Medications  Medication Sig Dispense Refill  . Adalimumab (HUMIRA PEN) 40 MG/0.4ML PNKT     . ALPRAZolam (XANAX) 0.25 MG tablet One po 3 times daily as needed for anxiety and vertigo 90 tablet 1  . Ascorbic Acid (VITAMIN C) 1000 MG tablet Take 1,000 mg by mouth daily.    Marland Kitchen  calcium carbonate (OS-CAL) 600 MG TABS Take 600 mg by mouth 2 (two) times daily with a meal.    . cholecalciferol (VITAMIN D) 1000 UNITS tablet Take by mouth daily.    Marland Kitchen denosumab (PROLIA) 60 MG/ML SOSY injection Inject 60 mg into the skin every 6 (six) months.    . FOLIC ACID PO Take 2,725 mg by mouth daily.    . meloxicam (MOBIC) 15 MG tablet Take 1 tablet by mouth as  needed.    . methotrexate (RHEUMATREX) 2.5 MG tablet Take 15 mg by mouth once a week. Caution:Chemotherapy. Protect from light.    . Na Sulfate-K Sulfate-Mg Sulf 17.5-3.13-1.6 GM/177ML SOLN Take 1 kit by mouth once for 1 dose. Apply Coupon=BIN: [id number] PCN: CN GROUP: [id number] ID: [id number]; NO prior authorization 324 mL 0   No current facility-administered medications for this visit.    Allergies as of 04/25/2020  . (No Known Allergies)    Family History  Problem Relation Age of Onset  . Cancer Mother 62       Dec Lung CA  . Thyroid disease Mother        thyroidectomy in her 20's--unsure of reason  . Cancer Father        Dec Bladder CA age 46  . Heart disease Father   . Hypertension Father   . Hyperlipidemia Father   . Stroke Paternal Grandfather   . Breast cancer Maternal Aunt   . Colon cancer Neg Hx   . Pancreatic cancer Neg Hx   . Esophageal cancer Neg Hx   . Rectal cancer Neg Hx     Social History   Socioeconomic History  . Marital status: Married    Spouse name: Not on file  . Number of children: Not on file  . Years of education: Not on file  . Highest education level: Not on file  Occupational History  . Not on file  Tobacco Use  . Smoking status: Former Smoker    Packs/day: 1.00    Years: 20.00    Pack years: 20.00    Types: Cigarettes    Quit date: 05/04/1990    Years since quitting: 29.9  . Smokeless tobacco: Never Used  Vaping Use  . Vaping Use: Never used  Substance and Sexual Activity  . Alcohol use: No    Alcohol/week: 0.0 standard drinks  . Drug use: No  . Sexual activity: Not Currently    Partners: Male    Birth control/protection: Post-menopausal  Other Topics Concern  . Not on file  Social History Narrative  . Not on file   Social Determinants of Health   Financial Resource Strain: Not on file  Food Insecurity: Not on file  Transportation Needs: Not on file  Physical Activity: Not on file  Stress: Not on file  Social Connections:  Not on file  Intimate Partner Violence: Not on file    Review of Systems: 12 system ROS is negative except as noted above.   Physical Exam: General:   Alert,  well-nourished, pleasant and cooperative in NAD Head:  Normocephalic and atraumatic. Eyes:  Sclera clear, no icterus.   Conjunctiva pink. Ears:  Normal auditory acuity. Nose:  No deformity, discharge,  or lesions. Mouth:  No deformity or lesions.   Neck:  Supple; no masses or thyromegaly. Lungs:  Clear throughout to auscultation.   No wheezes. Heart:  Regular rate and rhythm; no murmurs. Abdomen:  Soft, nontender, nondistended, normal bowel sounds, no rebound or guarding. No hepatosplenomegaly.  Rectal:  Deferred  Msk:  Symmetrical. No boney deformities LAD: No inguinal or umbilical LAD Extremities:  No clubbing or edema. Neurologic:  Alert and  oriented x4;  grossly nonfocal Skin:  Intact without significant lesions or rashes. Psych:  Alert and cooperative. Normal mood and affect.   Yarelie Hams L. Tarri Glenn, MD, MPH 04/25/2020, 10:11 AM

## 2020-05-09 ENCOUNTER — Other Ambulatory Visit: Payer: Medicare Other | Admitting: Internal Medicine

## 2020-05-09 ENCOUNTER — Other Ambulatory Visit: Payer: Self-pay

## 2020-05-09 DIAGNOSIS — M810 Age-related osteoporosis without current pathological fracture: Secondary | ICD-10-CM

## 2020-05-09 DIAGNOSIS — M0579 Rheumatoid arthritis with rheumatoid factor of multiple sites without organ or systems involvement: Secondary | ICD-10-CM

## 2020-05-09 DIAGNOSIS — Z13220 Encounter for screening for lipoid disorders: Secondary | ICD-10-CM

## 2020-05-09 DIAGNOSIS — Z0000 Encounter for general adult medical examination without abnormal findings: Secondary | ICD-10-CM

## 2020-05-10 ENCOUNTER — Encounter: Payer: Self-pay | Admitting: Internal Medicine

## 2020-05-10 ENCOUNTER — Ambulatory Visit (INDEPENDENT_AMBULATORY_CARE_PROVIDER_SITE_OTHER): Payer: Medicare Other | Admitting: Internal Medicine

## 2020-05-10 ENCOUNTER — Other Ambulatory Visit: Payer: Self-pay

## 2020-05-10 ENCOUNTER — Telehealth: Payer: Self-pay | Admitting: Internal Medicine

## 2020-05-10 VITALS — BP 120/70 | HR 76 | Ht 62.0 in | Wt 133.0 lb

## 2020-05-10 DIAGNOSIS — M0579 Rheumatoid arthritis with rheumatoid factor of multiple sites without organ or systems involvement: Secondary | ICD-10-CM

## 2020-05-10 DIAGNOSIS — Z0000 Encounter for general adult medical examination without abnormal findings: Secondary | ICD-10-CM | POA: Diagnosis not present

## 2020-05-10 DIAGNOSIS — M810 Age-related osteoporosis without current pathological fracture: Secondary | ICD-10-CM

## 2020-05-10 LAB — CBC WITH DIFFERENTIAL/PLATELET
Absolute Monocytes: 843 cells/uL (ref 200–950)
Basophils Absolute: 82 cells/uL (ref 0–200)
Basophils Relative: 1.2 %
Eosinophils Absolute: 272 cells/uL (ref 15–500)
Eosinophils Relative: 4 %
HCT: 44.4 % (ref 35.0–45.0)
Hemoglobin: 15 g/dL (ref 11.7–15.5)
Lymphs Abs: 2516 cells/uL (ref 850–3900)
MCH: 32.1 pg (ref 27.0–33.0)
MCHC: 33.8 g/dL (ref 32.0–36.0)
MCV: 95.1 fL (ref 80.0–100.0)
MPV: 9.5 fL (ref 7.5–12.5)
Monocytes Relative: 12.4 %
Neutro Abs: 3087 cells/uL (ref 1500–7800)
Neutrophils Relative %: 45.4 %
Platelets: 301 10*3/uL (ref 140–400)
RBC: 4.67 10*6/uL (ref 3.80–5.10)
RDW: 12.6 % (ref 11.0–15.0)
Total Lymphocyte: 37 %
WBC: 6.8 10*3/uL (ref 3.8–10.8)

## 2020-05-10 LAB — LIPID PANEL
Cholesterol: 208 mg/dL — ABNORMAL HIGH (ref <200)
HDL: 86 mg/dL (ref >=50)
LDL Cholesterol (Calc): 102 mg/dL (calc) — ABNORMAL HIGH
Non-HDL Cholesterol (Calc): 122 mg/dL (calc) (ref <130)
Total CHOL/HDL Ratio: 2.4 (calc) (ref <5.0)
Triglycerides: 103 mg/dL (ref <150)

## 2020-05-10 LAB — COMPLETE METABOLIC PANEL WITH GFR
AG Ratio: 1.5 (calc) (ref 1.0–2.5)
ALT: 16 U/L (ref 6–29)
AST: 20 U/L (ref 10–35)
Albumin: 4.1 g/dL (ref 3.6–5.1)
Alkaline phosphatase (APISO): 51 U/L (ref 37–153)
BUN: 13 mg/dL (ref 7–25)
CO2: 29 mmol/L (ref 20–32)
Calcium: 9 mg/dL (ref 8.6–10.4)
Chloride: 106 mmol/L (ref 98–110)
Creat: 0.68 mg/dL (ref 0.50–0.99)
GFR, Est African American: 106 mL/min/{1.73_m2} (ref >=60)
GFR, Est Non African American: 92 mL/min/{1.73_m2} (ref >=60)
Globulin: 2.8 g/dL (calc) (ref 1.9–3.7)
Glucose, Bld: 87 mg/dL (ref 65–99)
Potassium: 4.2 mmol/L (ref 3.5–5.3)
Sodium: 140 mmol/L (ref 135–146)
Total Bilirubin: 0.6 mg/dL (ref 0.2–1.2)
Total Protein: 6.9 g/dL (ref 6.1–8.1)

## 2020-05-10 LAB — TSH: TSH: 3.91 mIU/L (ref 0.40–4.50)

## 2020-05-10 MED ORDER — CYCLOBENZAPRINE HCL 10 MG PO TABS: 10.0000 mg | ORAL_TABLET | Freq: Every day | ORAL | 0 refills | Status: DC

## 2020-05-10 NOTE — Telephone Encounter (Signed)
Flexeril sent to pharmacy patient notified.

## 2020-05-10 NOTE — Progress Notes (Signed)
Subjective:    Patient ID: Katrina [name], female    DOB: [birth year], 65 y.o.   MRN: [id number]  HPI 65 year old Female for Welcome to Medicare exam, health maintenance exam,  and evaluation of medical issues.  Past medical history: History of rheumatoid arthritis.  CCP positive.  Has been treated with Humira and methotrexate with good success.  She took Fosamax for 5 years by GYN for osteopenia but stopped in 2012.  History of osteoporosis with bone density study January 2021 showing T score -2.8 in the LS spine and -2.6 in the right femoral neck.  Now on Prolia per her Rheumatologist.    Past medical history: Tonsillectomy 2014.  Appendectomy 1976.  In September 2020 she had closed fracture of the phalanx of left foot treated by orthopedist.  History of rheumatoid polyarthritis involving hands, wrist, shoulders, knees, ankles and toes.  In 2017 she had left lower lobe pneumonia.  Dr. Edward Jolly is her GYN physician.  Mammogram scheduled for later this month. Bone density done last year.To have colonoscopy next week. Had flu vaccine at [facility].  Social history: She does not smoke or consume alcohol.  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  She and her husband have spent considerable time sailing.  No children.  Family history: Mother died of lung cancer.  Father with history of hypertension died at age 70 with history of congestive heart failure, bladder cancer and macular degeneration. Bone density Jan 20201 T score -2.8  Review of Systems recently was moving a 50 to 75 pound battery and noticed the next day her left hip and buttock were painful.  Worse to sit and lie down.  Sharp pain in that area.  Suspect she strained her back from heavy lifting and has mild left radiculopathy.  Recommend conservative treatment.  May order physical therapy if necessary if this does not improve in a few days.     Objective:   Physical Exam Blood pressure 120/70, pulse  oximetry 97% weight 133 pounds, height 5 feet 2 inches, BMI 24.33, pulse is 76 and regular  Skin is warm and dry.  Nodes none.  TMs are clear.  Neck is supple without JVD thyromegaly or carotid bruits.  Chest is clear to auscultation.  Cardiac exam: Regular rate and rhythm normal S1 and S2.  Breast without masses.  Abdomen soft nondistended without hepatosplenomegaly masses or tenderness.  Pelvic exam deferred to Dr. Edward Jolly.  No lower extremity pitting edema.  Joints are not red or swollen.  Neuro intact without focal deficits.  Affect thought and judgment are normal.       Assessment & Plan:  History of rheumatoid arthritis with positive CCP at time of diagnosis stable on methotrexate and Humira followed by rheumatologist  Osteoporosis treated with Prolia by rheumatologist  History of fractured left foot phalanx seen by orthopedist in 2020  Remote history of left lower lobe pneumonia while DMARD in 2017  History of peritonsillar abscess status post tonsillectomy 2014  History of anxiety treated sparingly with Xanax.  May take this also for vertigo if needed.  Plan: Continue current medications and return in 1 year or as needed.  Is to have colonoscopy next week.  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  Follow-up in 10 years.  Subjective:   Patient presents for Medicare Annual/Subsequent preventive examination.  Review Past Medical/Family/Social: See above   Risk Factors  Current  exercise habits: Physically quite active Dietary issues discussed: Yes  Cardiac risk factors: Family history of congestive heart failure in father  Depression Screen  (Note: if answer to either of the following is "Yes", a more complete depression screening is indicated)   Over the past two weeks, have you felt down, depressed or hopeless? No  Over the past two weeks, have you felt little interest or pleasure in doing things? No Have you lost interest or  pleasure in daily life? No Do you often feel hopeless? No Do you cry easily over simple problems? No   Activities of Daily Living  In your present state of health, do you have any difficulty performing the following activities?:   Driving? No  Managing money? No  Feeding yourself? No  Getting from bed to chair? No  Climbing a flight of stairs? No  Preparing food and eating?: No  Bathing or showering? No  Getting dressed: No  Getting to the toilet? No  Using the toilet:No  Moving around from place to place: No  In the past year have you fallen or had a near fall?:No  Are you sexually active? No  Do you have more than one partner? No   Hearing Difficulties: No  Do you often ask people to speak up or repeat themselves? No  Do you experience ringing or noises in your ears? No  Do you have difficulty understanding soft or whispered voices? No  Do you feel that you have a problem with memory? No Do you often misplace items? No    Home Safety:  Do you have a smoke alarm at your residence? Yes Do you have grab bars in the bathroom?  Yes Do you have throw rugs in your house?  None   Cognitive Testing  Alert? Yes Normal Appearance?Yes  Oriented to person? Yes Place? Yes  Time? Yes  Recall of three objects? Yes  Can perform simple calculations? Yes  Displays appropriate judgment?Yes  Can read the correct time from a watch face?Yes   List the Names of Other Physician/Practitioners you currently use:  See referral list for the physicians patient is currently seeing.  Dr. Edward Jolly  Rheumatologist   Review of Systems: See above   Objective:     General appearance: Appears younger than stated age Head: Normocephalic, without obvious abnormality, atraumatic  Eyes: conj clear, EOMi PEERLA  Ears: normal TM's and external ear canals both ears  Nose: Nares normal. Septum midline. Mucosa normal. No drainage or sinus tenderness.  Throat: lips, mucosa, and tongue normal; teeth and  gums normal  Neck: no adenopathy, no carotid bruit, no JVD, supple, symmetrical, trachea midline and thyroid not enlarged, symmetric, no tenderness/mass/nodules  No CVA tenderness.  Lungs: clear to auscultation bilaterally  Breasts: normal appearance, no masses or tenderness Heart: regular rate and rhythm, S1, S2 normal, no murmur, click, rub or gallop  Abdomen: soft, non-tender; bowel sounds normal; no masses, no organomegaly  Musculoskeletal: ROM normal in all joints, no crepitus, no deformity, Normal muscle strengthen. Back  is symmetric, no curvature. Skin: Skin color, texture, turgor normal. No rashes or lesions  Lymph nodes: Cervical, supraclavicular, and axillary nodes normal.  Neurologic: CN 2 -12 Normal, Normal symmetric reflexes. Normal coordination and gait  Psych: Alert & Oriented x 3, Mood appear stable.    Assessment:    Annual wellness medicare exam   Plan:    During the course of the visit the patient was educated and counseled about appropriate screening and  preventive services including:   Annual flu vaccine  Has had 3 COVID-19 vaccines  Has had Prevnar 13 and pneumococcal 23.  Tetanus immunization was done in 2011 and 10-year follow-up past due but Medicare will not pay for it unless she is injured.     Patient Instructions (the written plan) was given to the patient.  Medicare Attestation  I have personally reviewed:  The patient's medical and social history  Their use of alcohol, tobacco or illicit drugs  Their current medications and supplements  The patient's functional ability including ADLs,fall risks, home safety risks, cognitive, and hearing and visual impairment  Diet and physical activities  Evidence for depression or mood disorders  The patient's weight, height, BMI, and visual acuity have been recorded in the chart. I have made referrals, counseling, and provided education to the patient based on review of the above and I have provided the  patient with a written personalized care plan for preventive services.

## 2020-05-10 NOTE — Telephone Encounter (Signed)
Call in Flexeril 10 mg #12 one at bedtime- causes drowsiness

## 2020-05-10 NOTE — Telephone Encounter (Signed)
Katrina [name] [phone]  [name] called back after she left and said you guys had talked about her problem of grinding her teeth and you said something about some patients have taking a muscle relaxer. Do you think that would help her?

## 2020-05-11 ENCOUNTER — Encounter: Payer: Self-pay | Admitting: Internal Medicine

## 2020-05-11 NOTE — Patient Instructions (Addendum)
It was a pleasure to see you today.  Consider repeating pneumococcal 23 vaccine next year since you are now 65 years old and and remote history of pneumonia.  Return in 1 year or as needed.

## 2020-05-13 ENCOUNTER — Encounter: Payer: Self-pay | Admitting: Gastroenterology

## 2020-05-13 ENCOUNTER — Other Ambulatory Visit: Payer: Self-pay

## 2020-05-13 ENCOUNTER — Ambulatory Visit (AMBULATORY_SURGERY_CENTER): Payer: Medicare Other | Admitting: Gastroenterology

## 2020-05-13 VITALS — BP 103/67 | HR 78 | Temp 97.7°F | Resp 12 | Ht 62.25 in | Wt 135.0 lb

## 2020-05-13 DIAGNOSIS — K5900 Constipation, unspecified: Secondary | ICD-10-CM

## 2020-05-13 DIAGNOSIS — K625 Hemorrhage of anus and rectum: Secondary | ICD-10-CM | POA: Diagnosis not present

## 2020-05-13 DIAGNOSIS — K5520 Angiodysplasia of colon without hemorrhage: Secondary | ICD-10-CM | POA: Diagnosis not present

## 2020-05-13 DIAGNOSIS — K648 Other hemorrhoids: Secondary | ICD-10-CM

## 2020-05-13 MED ORDER — SODIUM CHLORIDE 0.9 % IV SOLN: 500.0000 mL | Freq: Once | INTRAVENOUS | Status: DC

## 2020-05-13 NOTE — Progress Notes (Signed)
Report given to PACU, vss 

## 2020-05-13 NOTE — Op Note (Signed)
[facility] Patient Name: Katrina [name] Procedure DateValen Mascaro2 2:44 PM MRN: [id number] Endoscopist: Tressia Danas MD, MD Age: 65 Referring MD:  Date of Birth: [birth year] Gender: Female Account #: [id number] Procedure:                Colonoscopy Indications:              Rectal bleeding                           Colonoscopy at the [facility] with                            Dr. Noe Gens 04/19/14. Had a polyp removed that was a                            "lymphoid." No hemorrhoids mentioned in the                            procedure note.                           No known family history of colon cancer or polyps. Medicines:                Monitored Anesthesia Care Procedure:                Pre-Anesthesia Assessment:                           - Prior to the procedure, a History and Physical                            was performed, and patient medications and                            allergies were reviewed. The patient's tolerance of                            previous anesthesia was also reviewed. The risks                            and benefits of the procedure and the sedation                            options and risks were discussed with the patient.                            All questions were answered, and informed consent                            was obtained. Prior Anticoagulants: The patient has                            taken no previous anticoagulant or antiplatelet  agents. ASA Grade Assessment: II - A patient with                            mild systemic disease. After reviewing the risks                            and benefits, the patient was deemed in                            satisfactory condition to undergo the procedure.                           After obtaining informed consent, the colonoscope                            was passed under direct vision. Throughout the                             procedure, the patient's blood pressure, pulse, and                            oxygen saturations were monitored continuously. The                            Olympus CF-HQ190 (#[id number]) Colonoscope was                            introduced through the anus and advanced to the 3                            cm into the ileum. A second forward view of the                            right colon was performed. The colonoscopy was                            performed without difficulty. The patient tolerated                            the procedure well. The quality of the bowel                            preparation was good. The terminal ileum, ileocecal                            valve, appendiceal orifice, and rectum were                            photographed. Scope In: 2:54:36 PM Scope Out: 3:08:45 PM Scope Withdrawal Time: 0 hours 11 minutes 6 seconds  Total Procedure Duration: 0 hours 14 minutes 9 seconds  Findings:                 The perianal and digital rectal examinations were  normal.                           Non-bleeding internal hemorrhoids were found.                           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                            The exam was otherwise without abnormality on                            direct and retroflexion views. Complications:            No immediate complications. Estimated Blood Loss:     Estimated blood loss: none. Impression:               - Non-bleeding internal hemorrhoids.                           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                           - The examination was otherwise normal on direct                            and retroflexion views. Recommendation:           - Patient has a contact number available for                            emergencies. The signs and symptoms of potential                            delayed complications were discussed with the                             patient. Return to normal activities tomorrow.                            Written discharge instructions were provided to the                            patient.                           - Follow a high fiber diet. Drink at least 64                            ounces of water daily. Add a daily stool bulking                            agent such as psyllium (an exampled would be                            Metamucil).                           -  Continue present medications.                           - Repeat colonoscopy in 10 years for screening                            purposes, earlier with new symptoms.                           - Emerging evidence supports eating a diet of                            fruits, vegetables, grains, calcium, and yogurt                            while reducing red meat and alcohol may reduce the                            risk of colon cancer.                           - Thank you for allowing me to be involved in your                            colon cancer prevention. Tressia Danas MD, MD 05/13/2020 3:19:22 PM This report has been signed electronically.

## 2020-05-13 NOTE — Progress Notes (Signed)
AR - Check-in CW- VS  

## 2020-05-13 NOTE — Patient Instructions (Signed)
YOU HAD AN ENDOSCOPIC PROCEDURE TODAY AT THE [facility]:   Refer to the procedure report that was given to you for any specific questions about what was found during the examination.  If the procedure report does not answer your questions, please call your gastroenterologist to clarify.  If you requested that your care partner not be given the details of your procedure findings, then the procedure report has been included in a sealed envelope for you to review at your convenience later.  YOU SHOULD EXPECT: Some feelings of bloating in the abdomen. Passage of more gas than usual.  Walking can help get rid of the air that was put into your GI tract during the procedure and reduce the bloating. If you had a lower endoscopy (such as a colonoscopy or flexible sigmoidoscopy) you may notice spotting of blood in your stool or on the toilet paper. If you underwent a bowel prep for your procedure, you may not have a normal bowel movement for a few days.  Please Note:  You might notice some irritation and congestion in your nose or some drainage.  This is from the oxygen used during your procedure.  There is no need for concern and it should clear up in a day or so.  SYMPTOMS TO REPORT IMMEDIATELY:   Following lower endoscopy (colonoscopy or flexible sigmoidoscopy):  Excessive amounts of blood in the stool  Significant tenderness or worsening of abdominal pains  Swelling of the abdomen that is new, acute  Fever of 100F or higher   Following upper endoscopy (EGD)  Vomiting of blood or coffee ground material  New chest pain or pain under the shoulder blades  Painful or persistently difficult swallowing  New shortness of breath  Fever of 100F or higher  Black, tarry-looking stools  For urgent or emergent issues, a gastroenterologist can be reached at any hour by calling [phone]. Do not use MyChart messaging for urgent concerns.    DIET:  We do recommend a small meal at first, but  then you may proceed to your regular diet.  Drink plenty of fluids but you should avoid alcoholic beverages for 24 hours.  ACTIVITY:  You should plan to take it easy for the rest of today and you should NOT DRIVE or use heavy machinery until tomorrow (because of the sedation medicines used during the test).    FOLLOW UP: Our staff will call the number listed on your records 48-72 hours following your procedure to check on you and address any questions or concerns that you may have regarding the information given to you following your procedure. If we do not reach you, we will leave a message.  We will attempt to reach you two times.  During this call, we will ask if you have developed any symptoms of COVID 19. If you develop any symptoms (ie: fever, flu-like symptoms, shortness of breath, cough etc.) before then, please call [phone].  If you test positive for Covid 19 in the 2 weeks post procedure, please call and report this information to us.    If any biopsies were taken you will be contacted by phone or by letter within the next 1-3 weeks.  Please call us at [phone] if you have not heard about the biopsies in 3 weeks.    SIGNATURES/CONFIDENTIALITY: You and/or your care partner have signed paperwork which will be entered into your electronic medical record.  These signatures attest to the fact that that the information above on   your After Visit Summary has been reviewed and is understood.  Full responsibility of the confidentiality of this discharge information lies with you and/or your care-partner. 

## 2020-05-15 ENCOUNTER — Telehealth: Payer: Self-pay

## 2020-05-15 ENCOUNTER — Telehealth: Payer: Self-pay | Admitting: *Deleted

## 2020-05-15 NOTE — Telephone Encounter (Signed)
  Follow up Call-  Call back number 05/13/2020  Post procedure Call Back phone  # #[phone] cell  Permission to leave phone message Yes  Some recent data might be hidden     Patient questions:  Do you have a fever, pain , or abdominal swelling? No. Pain Score  0 *  Have you tolerated food without any problems? Yes.    Have you been able to return to your normal activities? Yes.    Do you have any questions about your discharge instructions: Diet   No. Medications  No. Follow up visit  No.  Do you have questions or concerns about your Care? No.  Actions: * If pain score is 4 or above: No action needed, pain <4.  1. Have you developed a fever since your procedure? no  2.   Have you had an respiratory symptoms (SOB or cough) since your procedure? no  3.   Have you tested positive for COVID 19 since your procedure no  4.   Have you had any family members/close contacts diagnosed with the COVID 19 since your procedure?  no   If yes to any of these questions please route to Laverna Peace, RN and Karlton Lemon, RN

## 2020-05-15 NOTE — Telephone Encounter (Signed)
First follow up call attempt.  Message left to call with any questions or concerns. 

## 2020-05-17 LAB — POCT URINALYSIS DIPSTICK
Appearance: NEGATIVE
Bilirubin, UA: NEGATIVE
Blood, UA: NEGATIVE
Glucose, UA: NEGATIVE
Ketones, UA: NEGATIVE
Leukocytes, UA: NEGATIVE
Nitrite, UA: NEGATIVE
Odor: NEGATIVE
Protein, UA: NEGATIVE
Spec Grav, UA: 1.015 (ref 1.010–1.025)
Urobilinogen, UA: 0.2 E.U./dL
pH, UA: 6.5 (ref 5.0–8.0)

## 2020-05-28 ENCOUNTER — Other Ambulatory Visit: Payer: Self-pay

## 2020-05-28 ENCOUNTER — Ambulatory Visit
Admission: RE | Admit: 2020-05-28 | Discharge: 2020-05-28 | Disposition: A | Discharge status: Home / Self Care | Payer: Medicare Other | Source: Ambulatory Visit | Attending: Internal Medicine | Admitting: Internal Medicine

## 2020-05-28 DIAGNOSIS — Z1231 Encounter for screening mammogram for malignant neoplasm of breast: Secondary | ICD-10-CM

## 2020-05-29 ENCOUNTER — Encounter: Payer: Self-pay | Admitting: Internal Medicine

## 2020-08-06 IMAGING — MG MM DIGITAL SCREENING BILAT W/ TOMO W/ CAD
8 series · 9 of 24 positions shown · non-contrast
Comparison: Previous exam(s).

CLINICAL DATA: Screening.

EXAM:
DIGITAL SCREENING BILATERAL MAMMOGRAM WITH TOMO AND CAD

[L CC synth-2D]
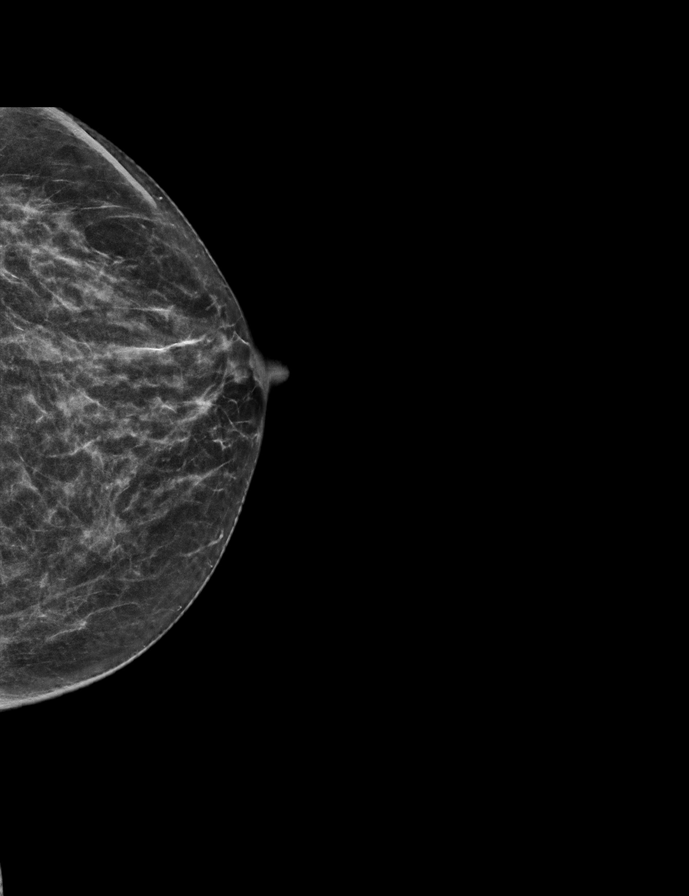

[L MLO synth-2D]
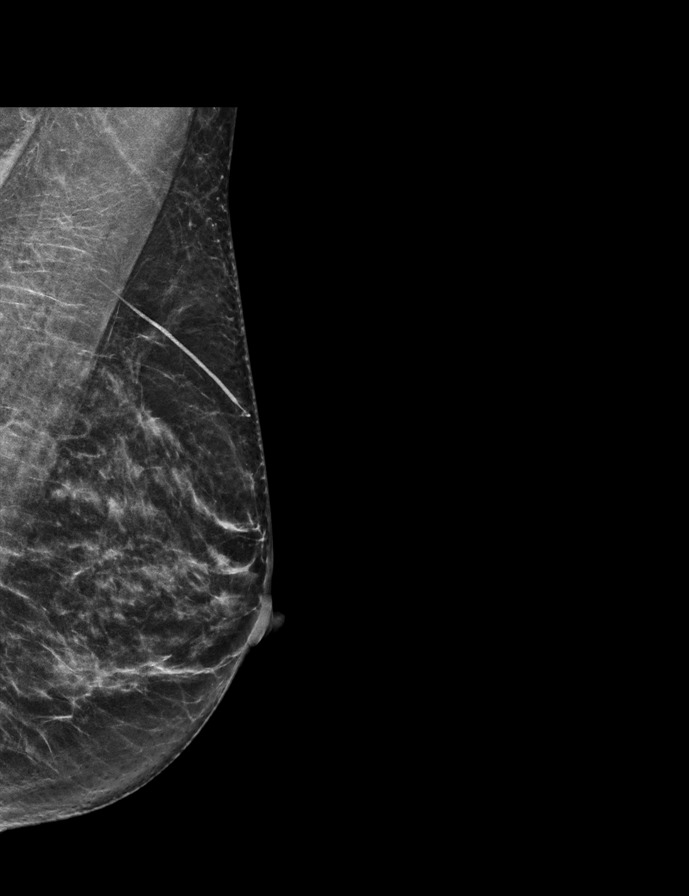

[R CC synth-2D]
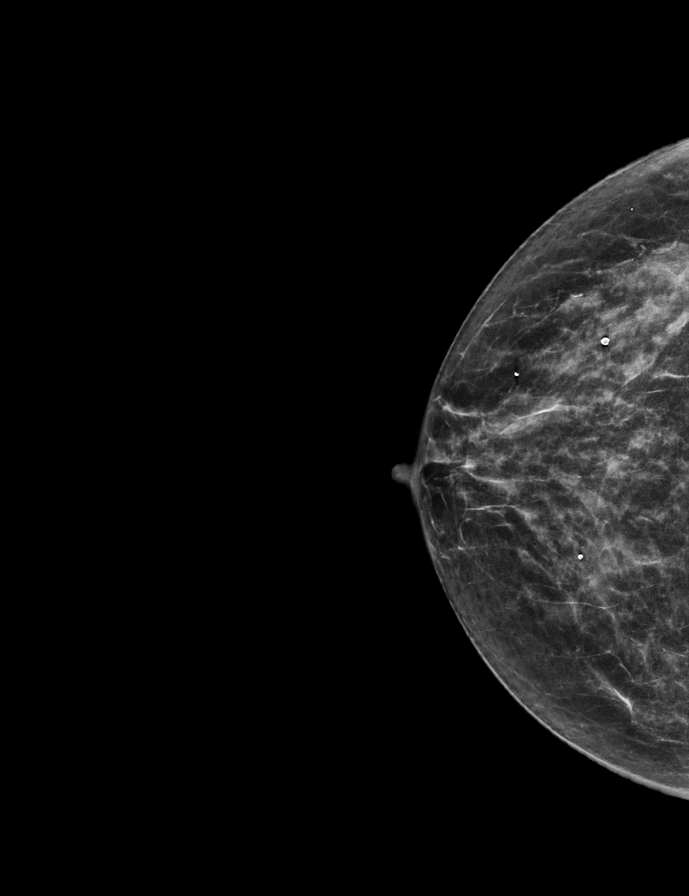

[R MLO synth-2D]
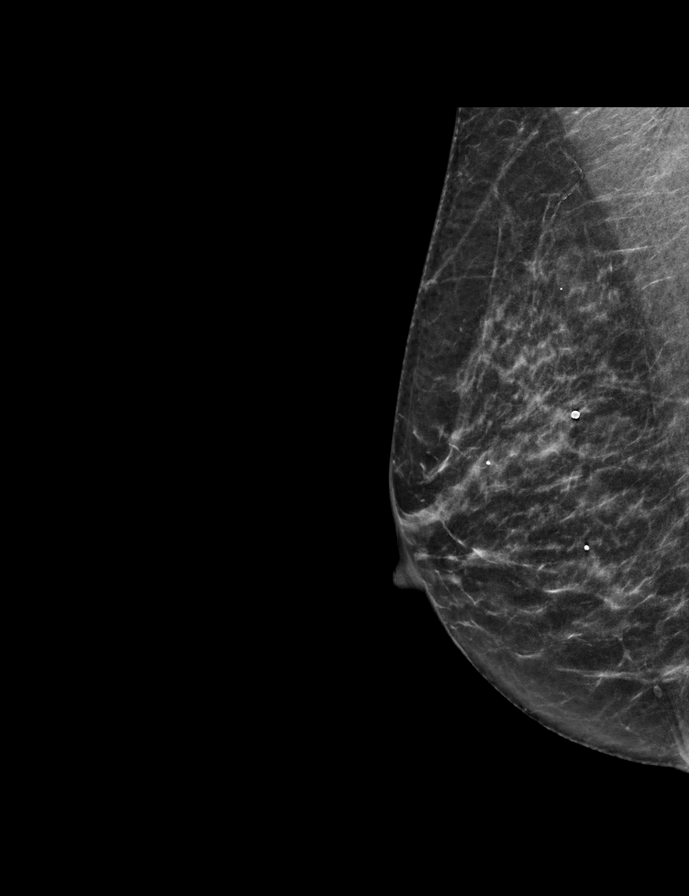

[L CC tomo · 2 of 51 frames shown]
[frame 17/51]
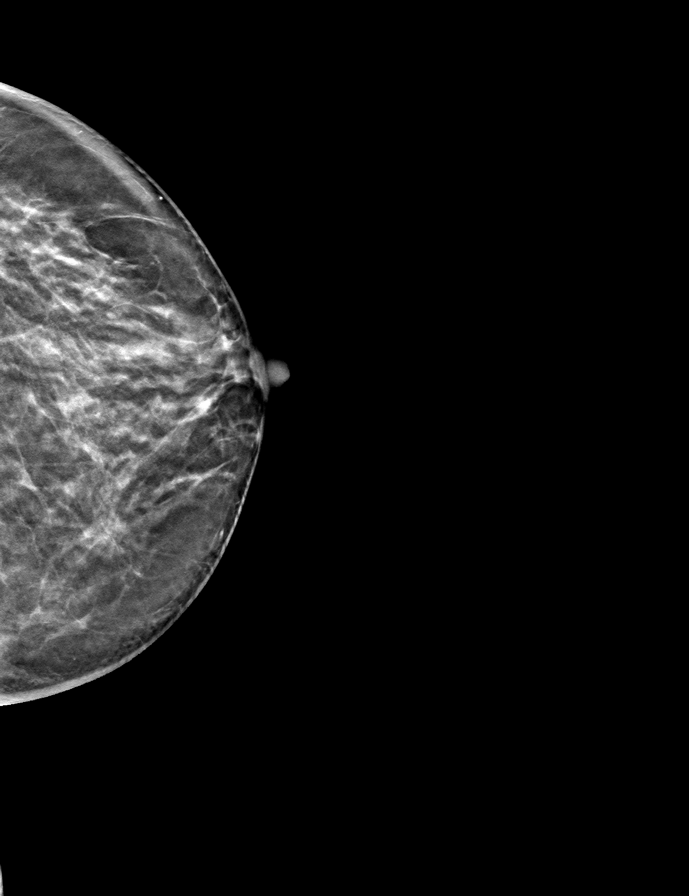
[frame 26/51]
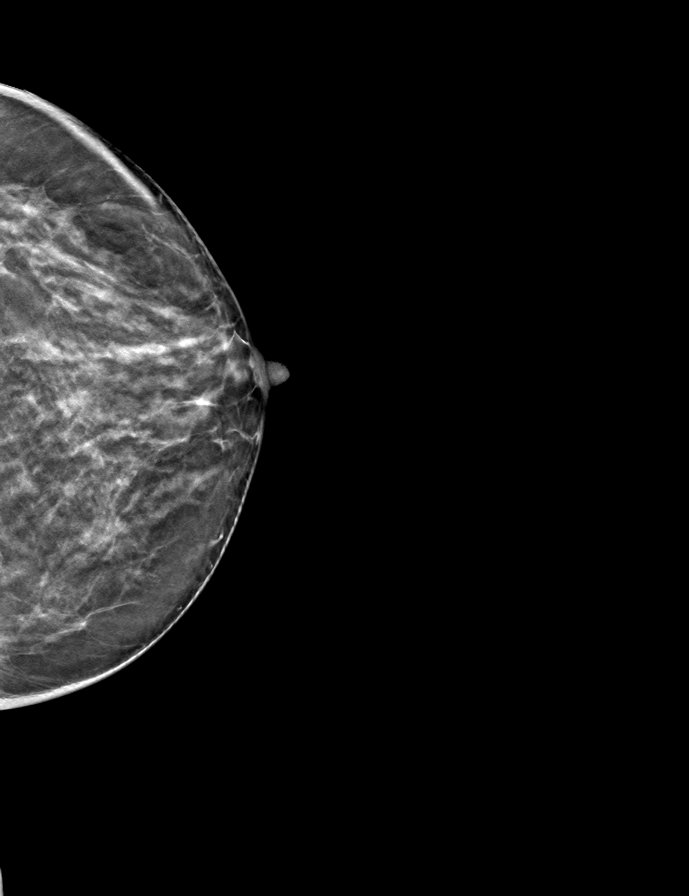

[L MLO tomo · tomo slice 28/55.0]
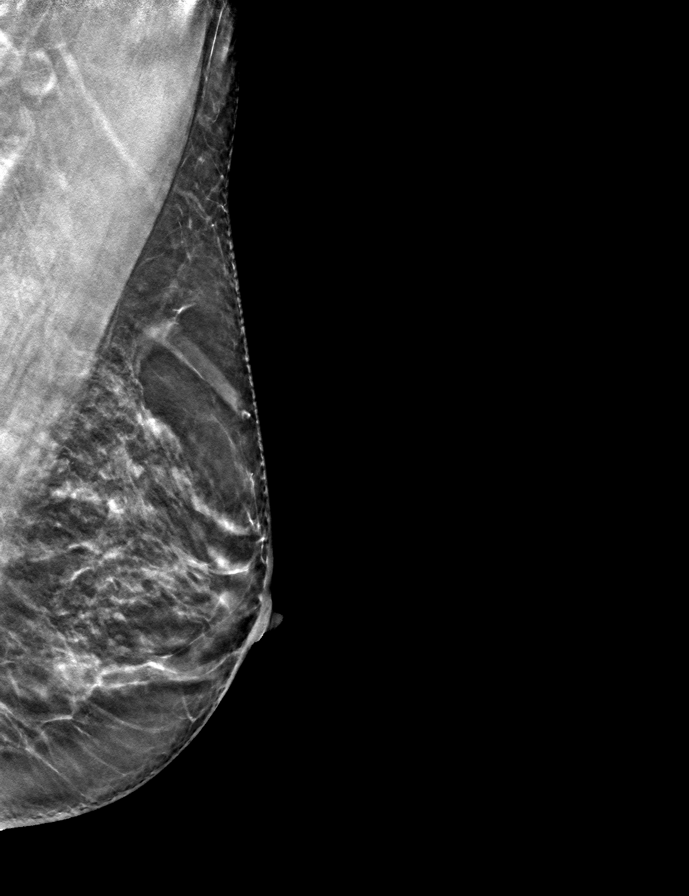

[R MLO tomo · tomo slice 26/51.0]
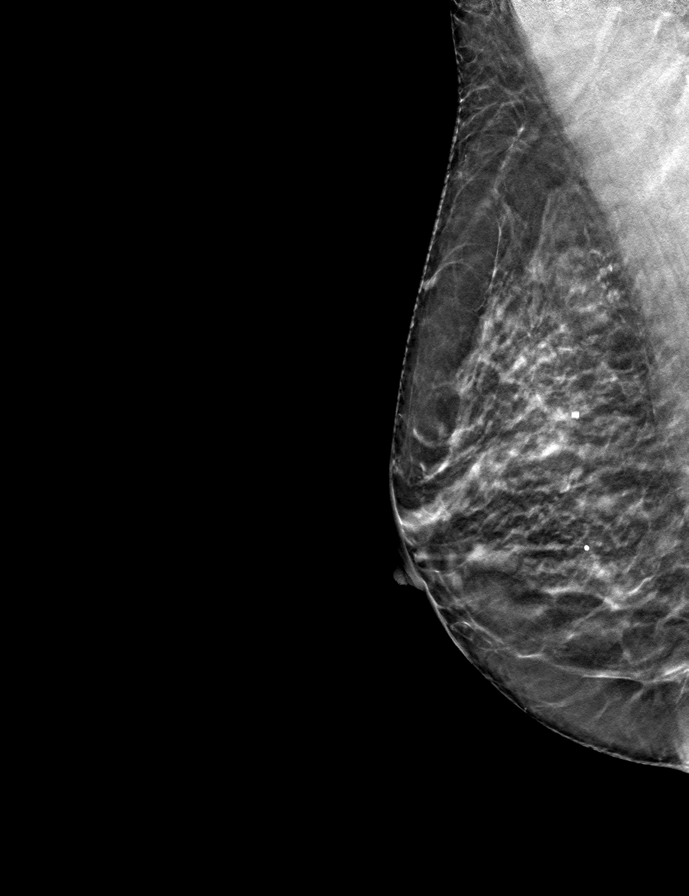

[R CC tomo · tomo slice 27/53.0]
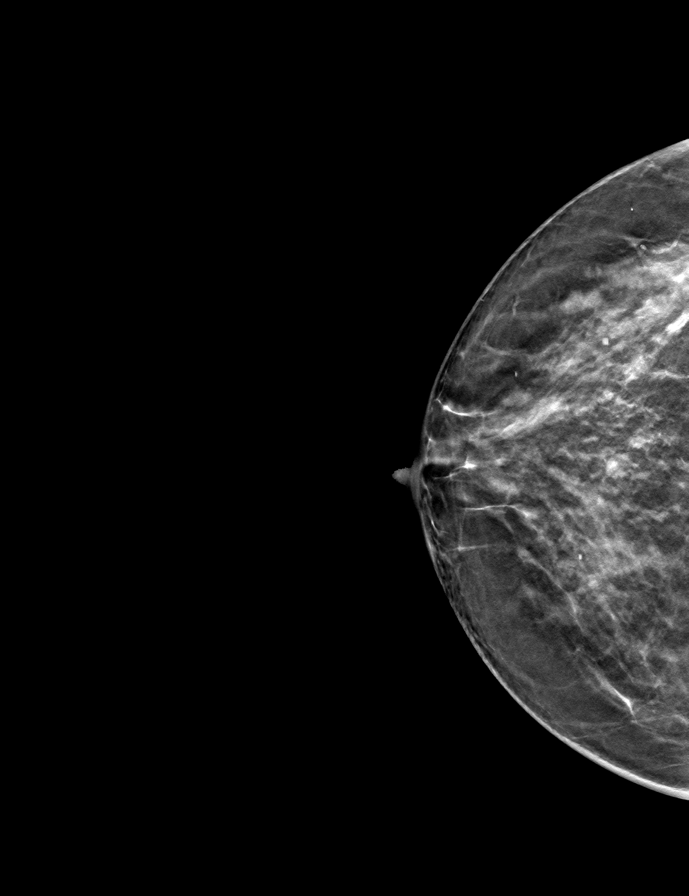

[9 of 24 positions shown; findings below may reference images not displayed]

ACR Breast Density Category c: The breast tissue is heterogeneously
dense, which may obscure small masses.
FINDINGS: There are no findings suspicious for malignancy. Images were
processed with CAD.
IMPRESSION: No mammographic evidence of malignancy. A result letter of this
screening mammogram will be mailed directly to the patient.

RECOMMENDATION:
Screening mammogram in one year. (Code:FT-U-LHB)

BI-RADS CATEGORY  1: Negative.

## 2021-03-06 ENCOUNTER — Ambulatory Visit: Payer: Medicare Other | Admitting: Obstetrics and Gynecology

## 2021-03-18 ENCOUNTER — Ambulatory Visit (INDEPENDENT_AMBULATORY_CARE_PROVIDER_SITE_OTHER): Payer: Medicare Other | Admitting: Obstetrics and Gynecology

## 2021-03-18 ENCOUNTER — Encounter: Payer: Self-pay | Admitting: Obstetrics and Gynecology

## 2021-03-18 ENCOUNTER — Other Ambulatory Visit: Payer: Self-pay

## 2021-03-18 VITALS — BP 100/70 | HR 79 | Ht 62.0 in | Wt 133.0 lb

## 2021-03-18 DIAGNOSIS — Z01419 Encounter for gynecological examination (general) (routine) without abnormal findings: Secondary | ICD-10-CM | POA: Diagnosis not present

## 2021-03-18 DIAGNOSIS — R208 Other disturbances of skin sensation: Secondary | ICD-10-CM | POA: Diagnosis not present

## 2021-03-18 NOTE — Progress Notes (Signed)
65 y.o. [id number] Married Caucasian female here for annual exam.    Dealing with burning mouth syndrome.  She is seeing her oral health provider and rheumatologist.   Saw GI for rectal bleeding.  Dx with hemorrhoid.  PCP:   Dr. Lenord Fellers  Patient's last menstrual period was 05/04/2002 (approximate).           Sexually active: No.  The current method of family planning is post menopausal status.    Exercising: Yes.     Walking, yoga and swimming Smoker:  Former  Health Maintenance: Pap:  02-06-20 Neg, 07-21-17 Neg:Neg HR HPV,04-10-15 Neg:Neg HR HPV.   History of abnormal Pap:  no MMG:  05-28-20 3D/Neg/BiRads1 Colonoscopy: 2021 normal;next 10 years BMD:   05-08-19  Result :Osteoporosis--on Prolia TDaP:  2016 w/PCP Gardasil:   no HIV:Neg in the past Hep C:Neg in the past Screening Labs:  PCP   reports that she quit smoking about 30 years ago. Her smoking use included cigarettes. She has a 20.00 pack-year smoking history. She has never used smokeless tobacco. She reports that she does not drink alcohol and does not use drugs.  Past Medical History:  Diagnosis Date   Arthritis    --rheumatoid   Colon polyp    Osteoporosis 04/05/14   T score -2.6 spine   Salpingitis    Had appendectomy at same time as salpingitis dx was made.    Past Surgical History:  Procedure Laterality Date   APPENDECTOMY     BREAST EXCISIONAL BIOPSY Left    COLONOSCOPY     left breast biopsy     POLYPECTOMY     TONSILLECTOMY AND ADENOIDECTOMY  2014    Current Outpatient Medications  Medication Sig Dispense Refill   Adalimumab (HUMIRA PEN) 40 MG/0.4ML PNKT      ALPRAZolam (XANAX) 0.25 MG tablet One po 3 times daily as needed for anxiety and vertigo 90 tablet 1   Ascorbic Acid (VITAMIN C) 1000 MG tablet Take 1,000 mg by mouth daily.     calcium carbonate (OS-CAL) 600 MG TABS Take 600 mg by mouth 2 (two) times daily with a meal.     cholecalciferol (VITAMIN D) 1000 UNITS tablet Take by mouth daily.      denosumab (PROLIA) 60 MG/ML SOSY injection Inject 60 mg into the skin every 6 (six) months.     folic acid (FOLVITE) 1 MG tablet Take 2 mg by mouth daily.     meloxicam (MOBIC) 15 MG tablet Take 1 tablet by mouth as needed.     methotrexate (RHEUMATREX) 2.5 MG tablet Take 15 mg by mouth once a week. Caution:Chemotherapy. Protect from light.     No current facility-administered medications for this visit.    Family History  Problem Relation Age of Onset   Cancer Mother 52       Dec Lung CA   Thyroid disease Mother        thyroidectomy in her 20's--unsure of reason   Cancer Father        Dec Bladder CA age 67   Heart disease Father    Hypertension Father    Hyperlipidemia Father    Stroke Paternal Grandfather    Breast cancer Maternal Aunt    Colon cancer Neg Hx    Pancreatic cancer Neg Hx    Esophageal cancer Neg Hx    Rectal cancer Neg Hx    Stomach cancer Neg Hx     Review of Systems  All other systems reviewed and  are negative.  Exam:   BP 100/70   Pulse 79   Ht 5\' 2"  (1.575 m)   Wt 133 lb (60.3 kg)   LMP 05/04/2002 (Approximate)   SpO2 99%   BMI 24.33 kg/m     General appearance: alert, cooperative and appears stated age Head: normocephalic, without obvious abnormality, atraumatic Neck: no adenopathy, supple, symmetrical, trachea midline and thyroid normal to inspection and palpation Lungs: clear to auscultation bilaterally Breasts: normal appearance, no masses or tenderness, No nipple retraction or dimpling, No nipple discharge or bleeding, No axillary adenopathy Heart: regular rate and rhythm Abdomen: soft, non-tender; no masses, no organomegaly Extremities: extremities normal, atraumatic, no cyanosis or edema Skin: skin color, texture, turgor normal. No rashes or lesions Lymph nodes: cervical, supraclavicular, and axillary nodes normal. Neurologic: grossly normal  Pelvic: External genitalia:  no lesions              No abnormal inguinal nodes palpated.               Urethra:  normal appearing urethra with no masses, tenderness or lesions              Bartholins and Skenes: normal                 Vagina: normal appearing vagina with normal color and discharge, no lesions              Cervix: no lesions              Pap taken: no Bimanual Exam:  Uterus:  normal size, contour, position, consistency, mobility, non-tender              Adnexa: no mass, fullness, tenderness              Rectal exam: yes.  Confirms.              Anus:  normal sphincter tone, no lesions  Chaperone was present for exam:  07/03/2002, CMA  Assessment:   Well woman visit with gynecologic exam. Osteoporosis.   PCP and Rheumatologist following.  On Prolia.  Rheumatoid arthritis.   Remote hx of abnormal pap.  Hx salpingitis.  Hx left breast biopsy.  Burning mouth sensation.   Plan: Mammogram screening discussed. Self breast awareness reviewed. Pap and HR HPV as above. Guidelines for Calcium, Vitamin D, regular exercise program including cardiovascular and weight bearing exercise. We discussed her oral pain and measures to try to improve the pain with increasing hydration, avoiding acidic foods/beverages/supplements such as vit C, and consider treatment with Gabapentin/Cymbalta/Elavil.  She may seek consultation with neurology or ENT.  Follow up 2 years and prn  After visit summary provided.   20 min  total time was spent for this patient encounter, including preparation, face-to-face counseling with the patient, coordination of care, and documentation of the encounter.

## 2021-03-18 NOTE — Patient Instructions (Signed)

## 2021-05-08 ENCOUNTER — Other Ambulatory Visit: Payer: Self-pay | Admitting: Obstetrics and Gynecology

## 2021-05-08 DIAGNOSIS — Z1231 Encounter for screening mammogram for malignant neoplasm of breast: Secondary | ICD-10-CM

## 2021-05-09 ENCOUNTER — Other Ambulatory Visit: Payer: Medicare Other | Admitting: Internal Medicine

## 2021-05-09 ENCOUNTER — Other Ambulatory Visit: Payer: Self-pay

## 2021-05-09 DIAGNOSIS — Z1329 Encounter for screening for other suspected endocrine disorder: Secondary | ICD-10-CM

## 2021-05-09 DIAGNOSIS — M810 Age-related osteoporosis without current pathological fracture: Secondary | ICD-10-CM

## 2021-05-09 DIAGNOSIS — M0579 Rheumatoid arthritis with rheumatoid factor of multiple sites without organ or systems involvement: Secondary | ICD-10-CM

## 2021-05-09 DIAGNOSIS — Z13220 Encounter for screening for lipoid disorders: Secondary | ICD-10-CM

## 2021-05-09 DIAGNOSIS — Z0000 Encounter for general adult medical examination without abnormal findings: Secondary | ICD-10-CM

## 2021-05-10 LAB — CBC WITH DIFFERENTIAL/PLATELET
Absolute Monocytes: 726 cells/uL (ref 200–950)
Basophils Absolute: 73 cells/uL (ref 0–200)
Basophils Relative: 1.1 %
Eosinophils Absolute: 277 cells/uL (ref 15–500)
Eosinophils Relative: 4.2 %
HCT: 43.5 % (ref 35.0–45.0)
Hemoglobin: 14.6 g/dL (ref 11.7–15.5)
Lymphs Abs: 2845 cells/uL (ref 850–3900)
MCH: 32.7 pg (ref 27.0–33.0)
MCHC: 33.6 g/dL (ref 32.0–36.0)
MCV: 97.3 fL (ref 80.0–100.0)
MPV: 9.2 fL (ref 7.5–12.5)
Monocytes Relative: 11 %
Neutro Abs: 2680 cells/uL (ref 1500–7800)
Neutrophils Relative %: 40.6 %
Platelets: 275 10*3/uL (ref 140–400)
RBC: 4.47 10*6/uL (ref 3.80–5.10)
RDW: 12.1 % (ref 11.0–15.0)
Total Lymphocyte: 43.1 %
WBC: 6.6 10*3/uL (ref 3.8–10.8)

## 2021-05-10 LAB — TSH: TSH: 2.96 mIU/L (ref 0.40–4.50)

## 2021-05-10 LAB — COMPLETE METABOLIC PANEL WITH GFR
AG Ratio: 1.4 (calc) (ref 1.0–2.5)
ALT: 14 U/L (ref 6–29)
AST: 18 U/L (ref 10–35)
Albumin: 4.1 g/dL (ref 3.6–5.1)
Alkaline phosphatase (APISO): 45 U/L (ref 37–153)
BUN: 16 mg/dL (ref 7–25)
CO2: 29 mmol/L (ref 20–32)
Calcium: 9.1 mg/dL (ref 8.6–10.4)
Chloride: 106 mmol/L (ref 98–110)
Creat: 0.7 mg/dL (ref 0.50–1.05)
Globulin: 2.9 g/dL (calc) (ref 1.9–3.7)
Glucose, Bld: 80 mg/dL (ref 65–99)
Potassium: 4.4 mmol/L (ref 3.5–5.3)
Sodium: 142 mmol/L (ref 135–146)
Total Bilirubin: 0.6 mg/dL (ref 0.2–1.2)
Total Protein: 7 g/dL (ref 6.1–8.1)
eGFR: 95 mL/min/{1.73_m2} (ref >=60)

## 2021-05-10 LAB — LIPID PANEL
Cholesterol: 206 mg/dL — ABNORMAL HIGH (ref <200)
HDL: 89 mg/dL (ref >=50)
LDL Cholesterol (Calc): 99 mg/dL (calc)
Non-HDL Cholesterol (Calc): 117 mg/dL (calc) (ref <130)
Total CHOL/HDL Ratio: 2.3 (calc) (ref <5.0)
Triglycerides: 90 mg/dL (ref <150)

## 2021-05-12 ENCOUNTER — Other Ambulatory Visit: Payer: Self-pay

## 2021-05-12 ENCOUNTER — Encounter: Payer: Self-pay | Admitting: Internal Medicine

## 2021-05-12 ENCOUNTER — Ambulatory Visit (INDEPENDENT_AMBULATORY_CARE_PROVIDER_SITE_OTHER): Payer: Medicare Other | Admitting: Internal Medicine

## 2021-05-12 VITALS — BP 108/62 | HR 75 | Temp 98.3°F | Ht 62.5 in | Wt 136.0 lb

## 2021-05-12 DIAGNOSIS — M810 Age-related osteoporosis without current pathological fracture: Secondary | ICD-10-CM

## 2021-05-12 DIAGNOSIS — M0579 Rheumatoid arthritis with rheumatoid factor of multiple sites without organ or systems involvement: Secondary | ICD-10-CM | POA: Diagnosis not present

## 2021-05-12 DIAGNOSIS — K146 Glossodynia: Secondary | ICD-10-CM

## 2021-05-12 DIAGNOSIS — Z7184 Encounter for health counseling related to travel: Secondary | ICD-10-CM | POA: Diagnosis not present

## 2021-05-12 DIAGNOSIS — Z0000 Encounter for general adult medical examination without abnormal findings: Secondary | ICD-10-CM | POA: Diagnosis not present

## 2021-05-12 LAB — POCT URINALYSIS DIPSTICK
Bilirubin, UA: NEGATIVE
Blood, UA: NEGATIVE
Glucose, UA: NEGATIVE
Ketones, UA: NEGATIVE
Leukocytes, UA: NEGATIVE
Nitrite, UA: NEGATIVE
Protein, UA: NEGATIVE
Spec Grav, UA: <=1.005 — AB (ref 1.010–1.025)
Urobilinogen, UA: 0.2 E.U./dL
pH, UA: 6.5 (ref 5.0–8.0)

## 2021-05-12 MED ORDER — CLONAZEPAM 0.5 MG PO TABS: 0.5000 mg | ORAL_TABLET | Freq: Two times a day (BID) | ORAL | 1 refills | Status: DC | PRN

## 2021-05-12 MED ORDER — HYDROCODONE-ACETAMINOPHEN 5-325 MG PO TABS: 1.0000 | ORAL_TABLET | Freq: Four times a day (QID) | ORAL | 0 refills | Status: DC | PRN

## 2021-05-12 MED ORDER — METHYLPREDNISOLONE 4 MG PO TBPK: ORAL_TABLET | ORAL | 0 refills | Status: DC

## 2021-05-12 MED ORDER — AZITHROMYCIN 250 MG PO TABS: ORAL_TABLET | ORAL | 0 refills | Status: AC

## 2021-05-12 NOTE — Progress Notes (Signed)
Annual Wellness Visit     Patient: Katrina [name], Female    DOB: 1955-11-25, 66 y.o.   MRN: [id number] Visit Date: 05/12/2021   Subjective    Katrina [name] is a 66 y.o. F who presents today for Medicare wellness first annual visit.  Had welcome to Medicare visit last year.  Physical exam.  History of rheumatoid arthritis (CCP positive).  Has been treated with Humira and methotrexate with good success.  Followed by Dr. Dierdre Forth.  She saw GYN, Dr. Edward Jolly for annual GYN exam in November 2022.  Patient continues to exercise with walking yoga and swimming.  She took Fosamax for 5 years by GYN for osteopenia but stopped in 2012.  History of osteoporosis seen on bone density study January 2021 showing T score of -2.8 in the LS spine and -2.6 in the right femoral neck.  Now on Prolia per her rheumatologist.  Tonsillectomy in 2014 and appendectomy in 1976.  In September 2020 she had closed fracture of the phalanx of left foot treated by orthopedist.  In 2017 she had left lower lobe pneumonia.  History of rheumatoid polyarthritis involving hands, wrist, shoulders, knees, ankles and toes.  She had colonoscopy in 2021 which was normal with follow-up in 10 years.  History of osteoporosis followed by her rheumatologist Dr. Dierdre Forth.  Had bone density study in January 2021 showing osteoporosis with T score in right femoral neck -2.6 and had been -2.3 in 2018.  AP spine had T score of -2.8 in January 2021 and had been -2.5 in March 2018.    Tetanus immunization is up-to-date.  Mammogram done May 29, 2021 is normal  She quit smoking some 30 years ago and had a 20-pack-year history.  She does not drink alcohol.  History of appendectomy, excisional breast biopsy on the left which was benign, tonsillectomy and adenoidectomy in 2014.  She is on Humira for rheumatoid arthritis and followed by Dr. Dierdre Forth.  She also takes methotrexate.  Mother died at age 73 with history of lung cancer.   Mother also had thyroidectomy in her 16s but patient is not sure of the reason.  Father died of bladder cancer at age 18 and had heart disease hypertension and hyperlipidemia.  There is breast cancer in maternal aunt and stroke in paternal grandfather.  Social history: Does not smoke or consume alcohol.  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  She and her husband have spent considerable time sailing.  No children.  Family history: Mother died of lung cancer.  Father with history of hypertension died at age 93 with history of congestive heart failure, bladder cancer and macular degeneration.  Colonoscopy done by Dr. 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  Follow-up recommended in 10 years.  In January 2022 was seen for welcome to Medicare exam and patient stated she had moved a 50 to 75 pound battery and noticed left hip and buttock were painful afterwards.  Suspect she had back strain with mild left radiculopathy and recommended conservative treatment.  Patient planning to travel in the near future and  was also giving a tapering Medrol 4 mg course starting with 6 tablets day 1 and decreasing by 1 tablet daily.  Also was given small prescription for hydrocodone APAP Norco 5/325 #15 tablets to take if needed for pain.  Tetanus immunization update given.    Patient has been having issues with burning mouth and anxiety.  Recommended trying Klonopin instead of Xanax.  She has looked this up on the Internet and it is apparently a benign condition.  Hopefully it will be of short duration.  Shingrix vaccine discussed.       Patient Care Team: Margaree Mackintosh, MD as PCP - General (Internal Medicine) Patton Salles, MD as Consulting Physician (Obstetrics and Gynecology)  Review of Systems see above   Objective    Vitals: BP 108/62    Pulse 75    Temp 98.3 F (36.8 C) (Tympanic)    Ht 5' 2.5" (1.588 m)    Wt 136 lb (61.7  kg)    LMP 05/04/2002 (Approximate)    SpO2 98%    BMI 24.48 kg/m   Physical Exam Her skin is warm and dry without rashes.  No cervical adenopathy.  TMs clear.  No thyromegaly.  Chest is clear.  Cardiac exam: Regular rate and rhythm without ectopy.  Abdomen is soft nondistended without hepatosplenomegaly masses or tenderness.  No lower extremity edema.  Neurological exam is intact without gross focal deficits.  Affect thought and judgment are normal.  Most recent functional status assessment: In your present state of health, do you have any difficulty performing the following activities: 05/12/2021  Hearing? N  Vision? N  Difficulty concentrating or making decisions? N  Walking or climbing stairs? N  Dressing or bathing? N  Doing errands, shopping? N  Preparing Food and eating ? N  Using the Toilet? N  In the past six months, have you accidently leaked urine? N  Do you have problems with loss of bowel control? N  Managing your Medications? N  Managing your Finances? N  Housekeeping or managing your Housekeeping? N  Some recent data might be hidden   Most recent fall risk assessment: Fall Risk  05/12/2021  Falls in the past year? 0  Number falls in past yr: 0  Injury with Fall? 0  Risk for fall due to : No Fall Risks  Follow up Falls evaluation completed    Most recent depression screenings: PHQ 2/9 Scores 05/12/2021 05/10/2020  PHQ - 2 Score 0 0   Most recent cognitive screening: 6CIT Screen 05/12/2021  What Year? 0 points  What month? 0 points  What time? 0 points  Count back from 20 0 points  Months in reverse 0 points  Repeat phrase 0 points  Total Score 0       Assessment & Plan  History of left lower extremity radiculopathy after moving a battery  Travel advice encounter-prescribed Medrol Dosepak, Klonopin and Norco 5/325  Osteoporosis treated with Prolia  Rheumatoid arthritis treated with Humira and Mobic  Records indicate only 3 COVID immunizations.  Should  consider booster before travel.  Tetanus immunization given.  Has had pneumococcal vaccines.  Consider flu vaccine.  Colonoscopy and mammogram up-to-date.  Return in 1 year or as needed.     Annual wellness visit done today including the all of the following: Reviewed patient's Family Medical History Reviewed and updated list of patient's medical providers Assessment of cognitive impairment was done Assessed patient's functional ability Established a written schedule for health screening services Health Risk Assessent Completed and Reviewed  Discussed health benefits of physical activity, and encouraged her to engage in regular exercise appropriate for her age and condition.         IMargaree Mackintosh, MD, have reviewed all documentation for this visit. The documentation on 06/01/21 for the exam, diagnosis, procedures,  and orders are all accurate and complete.   Jama Flavors, CMA

## 2021-05-12 NOTE — Patient Instructions (Addendum)
Tetanus immunization given. Try Klonopin for burning mouth and anxiety. Shingrix vaccine discussed.

## 2021-05-29 ENCOUNTER — Ambulatory Visit
Admission: RE | Admit: 2021-05-29 | Discharge: 2021-05-29 | Disposition: A | Discharge status: Home / Self Care | Payer: Medicare Other | Source: Ambulatory Visit

## 2021-05-29 DIAGNOSIS — Z1231 Encounter for screening mammogram for malignant neoplasm of breast: Secondary | ICD-10-CM

## 2021-07-21 NOTE — Telephone Encounter (Signed)
Thank You.

## 2022-03-03 ENCOUNTER — Other Ambulatory Visit: Payer: Self-pay | Admitting: Internal Medicine

## 2022-03-03 DIAGNOSIS — M810 Age-related osteoporosis without current pathological fracture: Secondary | ICD-10-CM

## 2022-04-15 ENCOUNTER — Other Ambulatory Visit: Payer: Self-pay | Admitting: Obstetrics and Gynecology

## 2022-04-15 DIAGNOSIS — Z1231 Encounter for screening mammogram for malignant neoplasm of breast: Secondary | ICD-10-CM

## 2022-05-12 ENCOUNTER — Other Ambulatory Visit: Payer: Medicare HMO

## 2022-05-12 DIAGNOSIS — R5383 Other fatigue: Secondary | ICD-10-CM | POA: Diagnosis not present

## 2022-05-12 DIAGNOSIS — Z136 Encounter for screening for cardiovascular disorders: Secondary | ICD-10-CM | POA: Diagnosis not present

## 2022-05-12 DIAGNOSIS — Z13220 Encounter for screening for lipoid disorders: Secondary | ICD-10-CM

## 2022-05-13 DIAGNOSIS — L578 Other skin changes due to chronic exposure to nonionizing radiation: Secondary | ICD-10-CM | POA: Diagnosis not present

## 2022-05-13 DIAGNOSIS — L821 Other seborrheic keratosis: Secondary | ICD-10-CM | POA: Diagnosis not present

## 2022-05-13 DIAGNOSIS — L570 Actinic keratosis: Secondary | ICD-10-CM | POA: Diagnosis not present

## 2022-05-13 DIAGNOSIS — D229 Melanocytic nevi, unspecified: Secondary | ICD-10-CM | POA: Diagnosis not present

## 2022-05-13 LAB — COMPLETE METABOLIC PANEL WITH GFR
AG Ratio: 1.5 (calc) (ref 1.0–2.5)
ALT: 18 U/L (ref 6–29)
AST: 19 U/L (ref 10–35)
Albumin: 4.4 g/dL (ref 3.6–5.1)
Alkaline phosphatase (APISO): 51 U/L (ref 37–153)
BUN: 13 mg/dL (ref 7–25)
CO2: 27 mmol/L (ref 20–32)
Calcium: 9 mg/dL (ref 8.6–10.4)
Chloride: 105 mmol/L (ref 98–110)
Creat: 0.64 mg/dL (ref 0.50–1.05)
Globulin: 2.9 g/dL (calc) (ref 1.9–3.7)
Glucose, Bld: 87 mg/dL (ref 65–99)
Potassium: 4.2 mmol/L (ref 3.5–5.3)
Sodium: 141 mmol/L (ref 135–146)
Total Bilirubin: 0.6 mg/dL (ref 0.2–1.2)
Total Protein: 7.3 g/dL (ref 6.1–8.1)
eGFR: 97 mL/min/{1.73_m2} (ref >=60)

## 2022-05-13 LAB — CBC WITH DIFFERENTIAL/PLATELET
Absolute Monocytes: 613 cells/uL (ref 200–950)
Basophils Absolute: 73 cells/uL (ref 0–200)
Basophils Relative: 1 %
Eosinophils Absolute: 263 cells/uL (ref 15–500)
Eosinophils Relative: 3.6 %
HCT: 44.6 % (ref 35.0–45.0)
Hemoglobin: 15.2 g/dL (ref 11.7–15.5)
Lymphs Abs: 2701 cells/uL (ref 850–3900)
MCH: 33.7 pg — ABNORMAL HIGH (ref 27.0–33.0)
MCHC: 34.1 g/dL (ref 32.0–36.0)
MCV: 98.9 fL (ref 80.0–100.0)
MPV: 9.6 fL (ref 7.5–12.5)
Monocytes Relative: 8.4 %
Neutro Abs: 3650 cells/uL (ref 1500–7800)
Neutrophils Relative %: 50 %
Platelets: 312 10*3/uL (ref 140–400)
RBC: 4.51 10*6/uL (ref 3.80–5.10)
RDW: 12.7 % (ref 11.0–15.0)
Total Lymphocyte: 37 %
WBC: 7.3 10*3/uL (ref 3.8–10.8)

## 2022-05-13 LAB — LIPID PANEL
Cholesterol: 207 mg/dL — ABNORMAL HIGH (ref <200)
HDL: 96 mg/dL (ref >=50)
LDL Cholesterol (Calc): 92 mg/dL (calc)
Non-HDL Cholesterol (Calc): 111 mg/dL (calc) (ref <130)
Total CHOL/HDL Ratio: 2.2 (calc) (ref <5.0)
Triglycerides: 96 mg/dL (ref <150)

## 2022-05-13 LAB — TSH: TSH: 3.39 mIU/L (ref 0.40–4.50)

## 2022-05-14 ENCOUNTER — Encounter: Payer: Self-pay | Admitting: Internal Medicine

## 2022-05-14 ENCOUNTER — Ambulatory Visit: Payer: Medicare HMO | Admitting: Internal Medicine

## 2022-05-14 VITALS — BP 106/68 | HR 77 | Temp 98.0°F | Ht 62.5 in | Wt 138.4 lb

## 2022-05-14 DIAGNOSIS — S82831D Other fracture of upper and lower end of right fibula, subsequent encounter for closed fracture with routine healing: Secondary | ICD-10-CM

## 2022-05-14 DIAGNOSIS — M0579 Rheumatoid arthritis with rheumatoid factor of multiple sites without organ or systems involvement: Secondary | ICD-10-CM

## 2022-05-14 DIAGNOSIS — Z0000 Encounter for general adult medical examination without abnormal findings: Secondary | ICD-10-CM | POA: Diagnosis not present

## 2022-05-14 DIAGNOSIS — M810 Age-related osteoporosis without current pathological fracture: Secondary | ICD-10-CM

## 2022-05-14 DIAGNOSIS — Z23 Encounter for immunization: Secondary | ICD-10-CM | POA: Diagnosis not present

## 2022-05-14 DIAGNOSIS — Z8249 Family history of ischemic heart disease and other diseases of the circulatory system: Secondary | ICD-10-CM | POA: Diagnosis not present

## 2022-05-14 DIAGNOSIS — K146 Glossodynia: Secondary | ICD-10-CM

## 2022-05-14 LAB — POCT URINALYSIS DIPSTICK
Bilirubin, UA: NEGATIVE
Blood, UA: NEGATIVE
Glucose, UA: NEGATIVE
Ketones, UA: NEGATIVE
Leukocytes, UA: NEGATIVE
Nitrite, UA: NEGATIVE
Protein, UA: NEGATIVE
Spec Grav, UA: 1.01 (ref 1.010–1.025)
Urobilinogen, UA: 0.2 E.U./dL
pH, UA: 7 (ref 5.0–8.0)

## 2022-05-14 NOTE — Patient Instructions (Addendum)
Patient is to check with [facility] about Rx for burning mouth syndrome. Pneumococcal 20 vaccine given today.  Coronary calcium score ordered for coronary artery disease screening.  It was a pleasure to see you today.  Return in 1 year or as needed.

## 2022-05-14 NOTE — Progress Notes (Signed)
Annual Wellness Visit     Patient: Katrina [name], Female    DOB: 24-Dec-1955, 67 y.o.   MRN: [id number] Visit Date: 05/14/2022  Chief Complaint  Patient presents with   Medicare Wellness   Subjective    Katrina [name] is a 67 y.o. female who presents today for her Annual Wellness Visit.  HPI Here for Medicare wellness visit,  health maintenance exam, and evaluation of medical issues.  She has a history of Rheumatoid arthritis (CCP positive).  She is seen by rheumatologist and currently is on methotrexate 15 mg weekly.  She also takes Mobic 15 mg daily if needed.  She has a history of osteopenia and has a bone density study scheduled in April ordered by Dr. Amil Amen, her Rheumatologist.  Her last bone density study was in 2021 and lowest T-score was -2.8 consistent with osteoporosis.  She is receiving Prolia every 6 months.  In December 2023 she was seen at [facility] for closed fracture of right distal fibula.  This was treated with splinting, crutches and nonweightbearing.  She has follow-up with orthopedist tomorrow.  Dr. Quincy Simmonds is GYN physician and she sees her annually.  Patient exercises with walking, yoga and swimming.  She took Fosamax for 5 years by GYN for osteopenia but stopped in 2012.  History of osteoporosis seen on bone density study January 2021 showing T-score of -2.8 in the LS spine and -2.6 in the right femoral neck.  Has annual mammogram.  She quit smoking some 30 years ago and had a 20-pack-year history.  Does not drink alcohol.  History of appendectomy, excisional breast biopsy on the left which was benign.  History of tonsillectomy and adenoidectomy in 2014.  Family history: Mother died of lung cancer.  Father with history of hypertension died at age 46 with history of congestive heart failure, bladder cancer and macular degeneration.  Social history: She currently does not smoke or consume alcohol.  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  She and her husband has spent considerable time sailing.  They have no children.      Patient Care Team: Elby Showers, MD as PCP - General (Internal Medicine) Nunzio Cobbs, MD as Consulting Physician (Obstetrics and Gynecology)  Review of Systems history of burning mouth syndrome that started around 2020 and is stressful. She has found a prescription  for this that she would like to try.    Objective    Vitals: LMP 05/04/2002 (Approximate)   Physical Exam Blood pressure 106/68, pulse 77 and regular, temperature 98 degrees, pulse oximetry 99% on room air, weight 138 pounds 6.4 ounces, BMI 24.91, height 5 feet 2.5 inches  Skin: Warm and dry.  No cervical adenopathy.  No thyromegaly.  No carotid bruits.  TMs and pharynx are clear.  Chest is clear.  Cardiac exam: Regular rate and rhythm without ectopy.  Abdomen is soft nondistended without hepatosplenomegaly masses or tenderness.  Breast and GYN exam deferred to GYN physician.  Brief neurological exam intact without gross focal deficits.  Affect thought and judgment are normal.    Most recent fall risk assessment:    05/12/2021    2:13 PM  Fall Risk   Falls in the past year? 0  Number falls in past yr: 0  Injury with Fall? 0  Risk for fall due to : No Fall Risks  Follow up Falls evaluation completed    Most recent depression screenings:  05/12/2021    2:13 PM 05/10/2020    2:21 PM  PHQ 2/9 Scores  PHQ - 2 Score 0 0   Most recent cognitive screening:    05/12/2021    2:14 PM  6CIT Screen  What Year? 0 points  What month? 0 points  What time? 0 points  Count back from 20 0 points  Months in reverse 0 points  Repeat phrase 0 points  Total Score 0 points       Assessment & Plan   History of burning mouth syndrome.  Patient has found a prescription/recommendation for topical clonazepam solution to treat this condition.  It is 0.5 mg/cc.  I have written for 4 ounces.  Directions are Swish 5 cc in  mouth for 5 minutes then spit solution out.  Do this twice daily as needed for burning mouth syndrome.  Right distal fibula fracture seen by Dr. Doran Durand December 27 and treated with weightbearing cast.  Has follow-up visit tomorrow.  She stepped off of a curb in late December, fell, and rolled her right ankle.  Osteoporosis treated with Prolia per Dr. Amil Amen  Rheumatoid arthritis also treated by Dr. Amil Amen with methotrexate and Mobic.  Immunizations discussed.  Given pneumococcal 20 vaccine today.  Tetanus immunization was given in January 2023 and she received RSV in November 2023.  Received influenza vaccine October 2023.  Had COVID-vaccine September 2023.   Father had history of congestive heart failure.  Discussed coronary calcium scoring today and she is agreeable to this.  Order will be placed.       Annual wellness visit done today including the all of the following: Reviewed patient's Family Medical History Reviewed and updated list of patient's medical providers Assessment of cognitive impairment was done Assessed patient's functional ability Established a written schedule for health screening [place] Completed and Reviewed  Discussed health benefits of physical activity, and encouraged her to engage in regular exercise appropriate for her age and condition.         IElby Showers, MD, have reviewed all documentation for this visit. The documentation on 05/14/22 for the exam, diagnosis, procedures, and orders are all accurate and complete.   LaVon Barron Alvine, CMA

## 2022-05-15 DIAGNOSIS — S8264XD Nondisplaced fracture of lateral malleolus of right fibula, subsequent encounter for closed fracture with routine healing: Secondary | ICD-10-CM | POA: Diagnosis not present

## 2022-05-15 DIAGNOSIS — S8264XA Nondisplaced fracture of lateral malleolus of right fibula, initial encounter for closed fracture: Secondary | ICD-10-CM | POA: Diagnosis not present

## 2022-06-02 DIAGNOSIS — M0579 Rheumatoid arthritis with rheumatoid factor of multiple sites without organ or systems involvement: Secondary | ICD-10-CM | POA: Diagnosis not present

## 2022-06-15 DIAGNOSIS — S8264XD Nondisplaced fracture of lateral malleolus of right fibula, subsequent encounter for closed fracture with routine healing: Secondary | ICD-10-CM | POA: Diagnosis not present

## 2022-06-18 ENCOUNTER — Other Ambulatory Visit (HOSPITAL_BASED_OUTPATIENT_CLINIC_OR_DEPARTMENT_OTHER): Payer: Self-pay

## 2022-06-25 ENCOUNTER — Ambulatory Visit
Admission: RE | Admit: 2022-06-25 | Discharge: 2022-06-25 | Disposition: A | Discharge status: Home / Self Care | Payer: Medicare HMO | Source: Ambulatory Visit | Attending: Obstetrics and Gynecology | Admitting: Obstetrics and Gynecology

## 2022-06-25 DIAGNOSIS — Z1231 Encounter for screening mammogram for malignant neoplasm of breast: Secondary | ICD-10-CM | POA: Diagnosis not present

## 2022-07-02 DIAGNOSIS — R69 Illness, unspecified: Secondary | ICD-10-CM | POA: Diagnosis not present

## 2022-07-08 DIAGNOSIS — S8264XD Nondisplaced fracture of lateral malleolus of right fibula, subsequent encounter for closed fracture with routine healing: Secondary | ICD-10-CM | POA: Diagnosis not present

## 2022-07-30 ENCOUNTER — Ambulatory Visit (HOSPITAL_BASED_OUTPATIENT_CLINIC_OR_DEPARTMENT_OTHER)
Admission: RE | Admit: 2022-07-30 | Discharge: 2022-07-30 | Disposition: A | Discharge status: Home / Self Care | Payer: Medicare HMO | Source: Ambulatory Visit | Attending: Internal Medicine | Admitting: Internal Medicine

## 2022-07-30 DIAGNOSIS — Z0000 Encounter for general adult medical examination without abnormal findings: Secondary | ICD-10-CM | POA: Insufficient documentation

## 2022-07-31 DIAGNOSIS — R69 Illness, unspecified: Secondary | ICD-10-CM | POA: Diagnosis not present

## 2022-07-31 DIAGNOSIS — M25571 Pain in right ankle and joints of right foot: Secondary | ICD-10-CM | POA: Diagnosis not present

## 2022-07-31 DIAGNOSIS — S8264XD Nondisplaced fracture of lateral malleolus of right fibula, subsequent encounter for closed fracture with routine healing: Secondary | ICD-10-CM | POA: Diagnosis not present

## 2022-08-04 NOTE — Progress Notes (Signed)
LVM to CB and schedule appointment for CT Cardiac rescults

## 2022-08-04 NOTE — Progress Notes (Signed)
LVM to CB and schedule

## 2022-08-05 DIAGNOSIS — M19011 Primary osteoarthritis, right shoulder: Secondary | ICD-10-CM | POA: Diagnosis not present

## 2022-08-10 ENCOUNTER — Ambulatory Visit (INDEPENDENT_AMBULATORY_CARE_PROVIDER_SITE_OTHER): Payer: Medicare HMO | Admitting: Internal Medicine

## 2022-08-10 ENCOUNTER — Encounter: Payer: Self-pay | Admitting: Internal Medicine

## 2022-08-10 VITALS — BP 104/62 | HR 72 | Temp 98.9°F | Ht 62.8 in | Wt 133.8 lb

## 2022-08-10 DIAGNOSIS — M8580 Other specified disorders of bone density and structure, unspecified site: Secondary | ICD-10-CM

## 2022-08-10 DIAGNOSIS — R931 Abnormal findings on diagnostic imaging of heart and coronary circulation: Secondary | ICD-10-CM

## 2022-08-10 DIAGNOSIS — M810 Age-related osteoporosis without current pathological fracture: Secondary | ICD-10-CM

## 2022-08-10 DIAGNOSIS — M0579 Rheumatoid arthritis with rheumatoid factor of multiple sites without organ or systems involvement: Secondary | ICD-10-CM | POA: Diagnosis not present

## 2022-08-10 NOTE — Progress Notes (Signed)
Patient Care Team: Margaree Mackintosh, MD as PCP - General (Internal Medicine) Patton Salles, MD as Consulting Physician (Obstetrics and Gynecology)  Visit Date: 08/10/22  Subjective:    Patient ID: Katrina [name] , Female   DOB: 06/12/1955, 67 y.o.    MRN: [id number]   67 y.o. Female presents today to discuss CT coronary calcium scoring. Patient has a past medical history of Rheumatoid arthritis, colon polyp, osteoporosis  with T-score -2.6 spine 2015.    In December 2023, had close fracture  right distal fibula.  Saw Dr. Victorino Dike and is doing much better.  Followed by Dr. Dierdre Forth, Rheumatologist receiving  Prolia for osteoporosis.  She took Fosamax for 5 years by GYN for osteopenia but stopped in 2012.  Was found to have osteoporosis on bone density study January 2021 with T-score -2.8 in the LS spine and -2.6 in the right femoral neck.  She is a former smoker.  Quit smoking over 30 years ago and has a 20-pack-year history.  More recent bone density study August 20, 2022 shows T-score -2.3 in the right femoral neck.  T-score in AP spine is -2.1 improved from 2021 when it was -2.8  Family history of congestive heart failure in her father.  He had a history of hypertension.  Social history: She is retired.  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     Past Medical History:  Diagnosis Date   Arthritis    --rheumatoid   Colon polyp    Osteoporosis 04/05/14   T score -2.6 spine   Salpingitis    Had appendectomy at same time as salpingitis dx was made.     Family History  Problem Relation Age of Onset   Cancer Mother 30       Dec Lung CA   Thyroid disease Mother        thyroidectomy in her 20's--unsure of reason   Cancer Father        Dec Bladder CA age 40   Heart disease Father    Hypertension Father    Hyperlipidemia Father    Stroke Paternal Grandfather    Breast cancer Maternal Aunt    Colon cancer Neg Hx    Pancreatic cancer Neg Hx     Esophageal cancer Neg Hx    Rectal cancer Neg Hx    Stomach cancer Neg Hx          ROS no new complaints      Objective:   Vitals: Reviewed today   Physical Exam: She was not examined but we discussed at length coronary calcium scoring and CT of her lungs.  Her lungs show no evidence of tumor or lung disease.  Suspicious mediastinal adenopathy.  Her total coronary calcium score is 151 which I feel is quite good.  Left main score was 39, LAD score was 112, circumflex 4 was 0 with right coronary artery score was 0.  Lipid panel in January showed a total cholesterol of 207, HDL excellent at 96, triglycerides 96 and LDL 92.   Results:   Studies obtained and personally reviewed by me:  Imaging, colonoscopy, mammogram, bone density scan, echocardiogram, heart cath, stress test, CT calcium score, etc.    Labs:       Component Value Date/Time   NA 141 05/12/2022 0920   K 4.2 05/12/2022 0920   CL 105 05/12/2022 0920   CO2 27 05/12/2022 0920   GLUCOSE  87 05/12/2022 0920   BUN 13 05/12/2022 0920   CREATININE 0.64 05/12/2022 0920   CALCIUM 9.0 05/12/2022 0920   PROT 7.3 05/12/2022 0920   ALBUMIN 4.0 06/04/2016 1101   AST 19 05/12/2022 0920   ALT 18 05/12/2022 0920   ALKPHOS 75 06/04/2016 1101   BILITOT 0.6 05/12/2022 0920   GFRNONAA 92 05/09/2020 1120   GFRAA 106 05/09/2020 1120     Lab Results  Component Value Date   WBC 7.3 05/12/2022   HGB 15.2 05/12/2022   HCT 44.6 05/12/2022   MCV 98.9 05/12/2022   PLT 312 05/12/2022    Lab Results  Component Value Date   CHOL 207 (H) 05/12/2022   HDL 96 05/12/2022   LDLCALC 92 05/12/2022   TRIG 96 05/12/2022   CHOLHDL 2.2 05/12/2022            Assessment & Plan:   Coronary calcium score is quite good. Total Score  is 151.  I do not think she needs to take lipid-lowering medication at this point in time.  She is physically active.  Lung CT shows no evidence of tumor.  History of osteoporosis treated with  Prolia  History of rheumatoid arthritis  I,Alexander Ruley,acting as a scribe for Margaree Mackintosh, MD.,have documented all relevant documentation on the behalf of Margaree Mackintosh, MD,as directed by  Margaree Mackintosh, MD while in the presence of Margaree Mackintosh, MD.   I, Margaree Mackintosh, MD, have reviewed all documentation for this visit. The documentation on 08/23/22 for the exam, diagnosis, procedures, and orders are all accurate and complete.

## 2022-08-20 ENCOUNTER — Ambulatory Visit
Admission: RE | Admit: 2022-08-20 | Discharge: 2022-08-20 | Disposition: A | Discharge status: Home / Self Care | Payer: Medicare HMO | Source: Ambulatory Visit | Attending: Internal Medicine | Admitting: Internal Medicine

## 2022-08-20 DIAGNOSIS — M8589 Other specified disorders of bone density and structure, multiple sites: Secondary | ICD-10-CM | POA: Diagnosis not present

## 2022-08-20 DIAGNOSIS — Z780 Asymptomatic menopausal state: Secondary | ICD-10-CM | POA: Diagnosis not present

## 2022-08-20 DIAGNOSIS — L821 Other seborrheic keratosis: Secondary | ICD-10-CM | POA: Diagnosis not present

## 2022-08-20 DIAGNOSIS — L570 Actinic keratosis: Secondary | ICD-10-CM | POA: Diagnosis not present

## 2022-08-20 DIAGNOSIS — M810 Age-related osteoporosis without current pathological fracture: Secondary | ICD-10-CM

## 2022-08-23 DIAGNOSIS — M8580 Other specified disorders of bone density and structure, unspecified site: Secondary | ICD-10-CM | POA: Insufficient documentation

## 2022-08-23 NOTE — Patient Instructions (Signed)
Coronary calcium score is 151.  I do not think you need to take lipid-lowering medication at this point in time. CT shows no evidence of tumor.  Was a pleasure to see you today as always.

## 2022-09-01 DIAGNOSIS — M810 Age-related osteoporosis without current pathological fracture: Secondary | ICD-10-CM | POA: Diagnosis not present

## 2022-09-01 DIAGNOSIS — M1991 Primary osteoarthritis, unspecified site: Secondary | ICD-10-CM | POA: Diagnosis not present

## 2022-09-01 DIAGNOSIS — M0579 Rheumatoid arthritis with rheumatoid factor of multiple sites without organ or systems involvement: Secondary | ICD-10-CM | POA: Diagnosis not present

## 2022-09-01 DIAGNOSIS — Z6824 Body mass index (BMI) 24.0-24.9, adult: Secondary | ICD-10-CM | POA: Diagnosis not present

## 2022-09-02 LAB — LAB REPORT - SCANNED: EGFR: 92

## 2022-09-17 DIAGNOSIS — M810 Age-related osteoporosis without current pathological fracture: Secondary | ICD-10-CM | POA: Diagnosis not present

## 2022-09-21 ENCOUNTER — Other Ambulatory Visit: Payer: Self-pay

## 2022-09-21 MED ORDER — CLONAZEPAM 0.1 MG/ML ORAL SUSPENSION: 0.5000 mg | Freq: Two times a day (BID) | ORAL | 1 refills | Status: DC | PRN

## 2022-10-03 DIAGNOSIS — R69 Illness, unspecified: Secondary | ICD-10-CM | POA: Diagnosis not present

## 2022-12-08 DIAGNOSIS — M0579 Rheumatoid arthritis with rheumatoid factor of multiple sites without organ or systems involvement: Secondary | ICD-10-CM | POA: Diagnosis not present

## 2022-12-15 DIAGNOSIS — R69 Illness, unspecified: Secondary | ICD-10-CM | POA: Diagnosis not present

## 2022-12-25 DIAGNOSIS — R69 Illness, unspecified: Secondary | ICD-10-CM | POA: Diagnosis not present

## 2023-01-12 DIAGNOSIS — R69 Illness, unspecified: Secondary | ICD-10-CM | POA: Diagnosis not present

## 2023-03-02 DIAGNOSIS — M810 Age-related osteoporosis without current pathological fracture: Secondary | ICD-10-CM | POA: Diagnosis not present

## 2023-03-02 DIAGNOSIS — Z6823 Body mass index (BMI) 23.0-23.9, adult: Secondary | ICD-10-CM | POA: Diagnosis not present

## 2023-03-02 DIAGNOSIS — Z111 Encounter for screening for respiratory tuberculosis: Secondary | ICD-10-CM | POA: Diagnosis not present

## 2023-03-02 DIAGNOSIS — M1991 Primary osteoarthritis, unspecified site: Secondary | ICD-10-CM | POA: Diagnosis not present

## 2023-03-02 DIAGNOSIS — M25562 Pain in left knee: Secondary | ICD-10-CM | POA: Diagnosis not present

## 2023-03-02 DIAGNOSIS — M0579 Rheumatoid arthritis with rheumatoid factor of multiple sites without organ or systems involvement: Secondary | ICD-10-CM | POA: Diagnosis not present

## 2023-03-08 DIAGNOSIS — H2513 Age-related nuclear cataract, bilateral: Secondary | ICD-10-CM | POA: Diagnosis not present

## 2023-03-08 DIAGNOSIS — H40033 Anatomical narrow angle, bilateral: Secondary | ICD-10-CM | POA: Diagnosis not present

## 2023-03-24 DIAGNOSIS — M76892 Other specified enthesopathies of left lower limb, excluding foot: Secondary | ICD-10-CM | POA: Diagnosis not present

## 2023-03-25 DIAGNOSIS — M810 Age-related osteoporosis without current pathological fracture: Secondary | ICD-10-CM | POA: Diagnosis not present

## 2023-03-26 ENCOUNTER — Other Ambulatory Visit: Payer: Self-pay

## 2023-03-26 DIAGNOSIS — M810 Age-related osteoporosis without current pathological fracture: Secondary | ICD-10-CM | POA: Insufficient documentation

## 2023-03-28 DIAGNOSIS — R69 Illness, unspecified: Secondary | ICD-10-CM | POA: Diagnosis not present

## 2023-03-29 ENCOUNTER — Telehealth: Payer: Self-pay | Admitting: Pharmacy Technician

## 2023-03-29 ENCOUNTER — Other Ambulatory Visit: Payer: Self-pay

## 2023-03-29 NOTE — Telephone Encounter (Signed)
Dr. Dierdre Forth, [facility] note: patient will be scheduled as soon as possible.  Auth Submission: APPROVED Site of care: Site of care: [facility] Payer: [employer] Medication & CPT/J Code(s) submitted: Prolia (Denosumab) [id number] Route of submission (phone, fax, portal): PORTAL Phone # Fax # Auth type: Buy/Bill PB Units/visits requested: 2 DOSES Reference number: [id number] Approval from: 03/26/23 to 03/25/24

## 2023-04-07 DIAGNOSIS — S76112D Strain of left quadriceps muscle, fascia and tendon, subsequent encounter: Secondary | ICD-10-CM | POA: Diagnosis not present

## 2023-04-15 DIAGNOSIS — S76112D Strain of left quadriceps muscle, fascia and tendon, subsequent encounter: Secondary | ICD-10-CM | POA: Diagnosis not present

## 2023-05-03 DIAGNOSIS — M7541 Impingement syndrome of right shoulder: Secondary | ICD-10-CM | POA: Diagnosis not present

## 2023-05-03 DIAGNOSIS — M19011 Primary osteoarthritis, right shoulder: Secondary | ICD-10-CM | POA: Diagnosis not present

## 2023-05-13 ENCOUNTER — Other Ambulatory Visit: Payer: Medicare HMO

## 2023-05-13 DIAGNOSIS — E785 Hyperlipidemia, unspecified: Secondary | ICD-10-CM

## 2023-05-13 DIAGNOSIS — R931 Abnormal findings on diagnostic imaging of heart and coronary circulation: Secondary | ICD-10-CM

## 2023-05-13 DIAGNOSIS — M0579 Rheumatoid arthritis with rheumatoid factor of multiple sites without organ or systems involvement: Secondary | ICD-10-CM

## 2023-05-13 DIAGNOSIS — Z8249 Family history of ischemic heart disease and other diseases of the circulatory system: Secondary | ICD-10-CM

## 2023-05-13 DIAGNOSIS — Z0000 Encounter for general adult medical examination without abnormal findings: Secondary | ICD-10-CM

## 2023-05-13 DIAGNOSIS — Z1329 Encounter for screening for other suspected endocrine disorder: Secondary | ICD-10-CM

## 2023-05-13 DIAGNOSIS — M8580 Other specified disorders of bone density and structure, unspecified site: Secondary | ICD-10-CM

## 2023-05-13 DIAGNOSIS — M810 Age-related osteoporosis without current pathological fracture: Secondary | ICD-10-CM

## 2023-05-14 ENCOUNTER — Other Ambulatory Visit: Payer: Medicare HMO

## 2023-05-14 LAB — COMPLETE METABOLIC PANEL WITH GFR
AG Ratio: 1.6 (calc) (ref 1.0–2.5)
ALT: 16 U/L (ref 6–29)
AST: 19 U/L (ref 10–35)
Albumin: 4.1 g/dL (ref 3.6–5.1)
Alkaline phosphatase (APISO): 49 U/L (ref 37–153)
BUN: 15 mg/dL (ref 7–25)
CO2: 28 mmol/L (ref 20–32)
Calcium: 8.9 mg/dL (ref 8.6–10.4)
Chloride: 105 mmol/L (ref 98–110)
Creat: 0.7 mg/dL (ref 0.50–1.05)
Globulin: 2.6 g/dL (ref 1.9–3.7)
Glucose, Bld: 85 mg/dL (ref 65–99)
Potassium: 4.5 mmol/L (ref 3.5–5.3)
Sodium: 141 mmol/L (ref 135–146)
Total Bilirubin: 0.7 mg/dL (ref 0.2–1.2)
Total Protein: 6.7 g/dL (ref 6.1–8.1)
eGFR: 94 mL/min/{1.73_m2} (ref >=60)

## 2023-05-14 LAB — CBC WITH DIFFERENTIAL/PLATELET
Absolute Lymphocytes: 2568 {cells}/uL (ref 850–3900)
Absolute Monocytes: 844 {cells}/uL (ref 200–950)
Basophils Absolute: 89 {cells}/uL (ref 0–200)
Basophils Relative: 1.2 %
Eosinophils Absolute: 289 {cells}/uL (ref 15–500)
Eosinophils Relative: 3.9 %
HCT: 43.3 % (ref 35.0–45.0)
Hemoglobin: 14.6 g/dL (ref 11.7–15.5)
MCH: 32.8 pg (ref 27.0–33.0)
MCHC: 33.7 g/dL (ref 32.0–36.0)
MCV: 97.3 fL (ref 80.0–100.0)
MPV: 9.6 fL (ref 7.5–12.5)
Monocytes Relative: 11.4 %
Neutro Abs: 3611 {cells}/uL (ref 1500–7800)
Neutrophils Relative %: 48.8 %
Platelets: 284 10*3/uL (ref 140–400)
RBC: 4.45 10*6/uL (ref 3.80–5.10)
RDW: 12.5 % (ref 11.0–15.0)
Total Lymphocyte: 34.7 %
WBC: 7.4 10*3/uL (ref 3.8–10.8)

## 2023-05-14 LAB — LIPID PANEL
Cholesterol: 195 mg/dL (ref <200)
HDL: 91 mg/dL (ref >=50)
LDL Cholesterol (Calc): 87 mg/dL
Non-HDL Cholesterol (Calc): 104 mg/dL (ref <130)
Total CHOL/HDL Ratio: 2.1 (calc) (ref <5.0)
Triglycerides: 81 mg/dL (ref <150)

## 2023-05-14 LAB — TSH: TSH: 1.92 m[IU]/L (ref 0.40–4.50)

## 2023-05-17 ENCOUNTER — Encounter: Payer: Self-pay | Admitting: Internal Medicine

## 2023-05-17 ENCOUNTER — Ambulatory Visit: Payer: Medicare HMO | Admitting: Internal Medicine

## 2023-05-17 VITALS — BP 110/70 | HR 82 | Temp 98.8°F | Ht 62.5 in | Wt 133.0 lb

## 2023-05-17 DIAGNOSIS — J3489 Other specified disorders of nose and nasal sinuses: Secondary | ICD-10-CM

## 2023-05-17 DIAGNOSIS — J069 Acute upper respiratory infection, unspecified: Secondary | ICD-10-CM

## 2023-05-17 DIAGNOSIS — R059 Cough, unspecified: Secondary | ICD-10-CM

## 2023-05-17 LAB — POCT COVID BINAXNOW CARD: SARS Coronavirus 2 Ag: NEGATIVE

## 2023-05-17 MED ORDER — MUPIROCIN 2 % EX OINT: 1.0000 | TOPICAL_OINTMENT | Freq: Two times a day (BID) | CUTANEOUS | 0 refills | Status: DC

## 2023-05-17 MED ORDER — AZITHROMYCIN 250 MG PO TABS: ORAL_TABLET | ORAL | 0 refills | Status: AC

## 2023-05-17 MED ORDER — BENZONATATE 100 MG PO CAPS: 100.0000 mg | ORAL_CAPSULE | Freq: Three times a day (TID) | ORAL | 0 refills | Status: DC | PRN

## 2023-05-17 NOTE — Progress Notes (Signed)
 Patient Care Team: [name], MD as PCP - General (Internal Medicine) [name] [name], MD as Consulting Physician (Obstetrics and Gynecology)  Visit Date: 05/17/23  Subjective:  No chief complaint on file.  Patient PI:Katrina [name],Female DOB:03-28-1956,67 y.o. FMW:[id number]   68 y.o. Female presents today for acute sick visit with recent symptoms of congestion and sore throat. History of RA treated with DMARD medication. Has not tested for Covid-19 at-home or elsewhere. Reports that she woke up Saturday with a sore throat, which she used Zinc tablets for relief, that resolved yesterday, some discolored sputum, and congestion. Denies headache or chills/fever. Has upcoming travel planned.  Past Medical History:  Diagnosis Date   Arthritis    --rheumatoid   Colon polyp    Osteoporosis 04/05/14   T score -2.6 spine   Salpingitis    Had appendectomy at same time as salpingitis dx was made.    Family History  Problem Relation Age of Onset   Cancer Mother 72       Dec Lung CA   Thyroid disease Mother        thyroidectomy in her 20's--unsure of reason   Cancer Father        Dec Bladder CA age 31   Heart disease Father    Hypertension Father    Hyperlipidemia Father    Stroke Paternal Grandfather    Breast cancer Maternal Aunt    Colon cancer Neg Hx    Pancreatic cancer Neg Hx    Esophageal cancer Neg Hx    Rectal cancer Neg Hx    Stomach cancer Neg Hx    Social History   Social History Narrative   Not on file   Review of Systems  Constitutional:  Negative for fever and malaise/fatigue.  HENT:  Positive for congestion and sore throat (on initial onset, since resolved).   Eyes:  Negative for blurred vision.  Respiratory:  Positive for sputum production (nasal). Negative for cough and shortness of breath.   Cardiovascular:  Negative for chest pain, palpitations and leg swelling.  Gastrointestinal:  Negative for vomiting.  Musculoskeletal:  Negative for  back pain.  Skin:  Negative for rash.  Neurological:  Negative for loss of consciousness and headaches.     Objective:  Vitals: BP 110/70   Pulse 82   Temp 98.8 F (37.1 C)   Ht 5' 2.5 (1.588 m)   Wt 133 lb (60.3 kg)   LMP 05/04/2002 (Approximate)   SpO2 97%   BMI 23.94 kg/m   Physical Exam Vitals and nursing note reviewed.  Constitutional:      General: She is not in acute distress.    Appearance: Normal appearance. She is not toxic-appearing.  HENT:     Head: Normocephalic and atraumatic.     Ears:     Comments: Bilateral Ears: fluid     Mouth/Throat:     Pharynx: Posterior oropharyngeal erythema (slight) present.  Pulmonary:     Effort: Pulmonary effort is normal.  Skin:    General: Skin is warm and dry.  Neurological:     Mental Status: She is alert and oriented to person, place, and time. Mental status is at baseline.  Psychiatric:        Mood and Affect: Mood normal.        Behavior: Behavior normal.        Thought Content: Thought content normal.        Judgment: Judgment normal.  Results:  Studies Obtained And Personally Reviewed By Me: Labs:     Component Value Date/Time   NA 141 05/13/2023 0950   K 4.5 05/13/2023 0950   CL 105 05/13/2023 0950   CO2 28 05/13/2023 0950   GLUCOSE 85 05/13/2023 0950   BUN 15 05/13/2023 0950   CREATININE 0.70 05/13/2023 0950   CALCIUM  8.9 05/13/2023 0950   PROT 6.7 05/13/2023 0950   ALBUMIN 4.0 06/04/2016 1101   AST 19 05/13/2023 0950   ALT 16 05/13/2023 0950   ALKPHOS 75 06/04/2016 1101   BILITOT 0.7 05/13/2023 0950   GFRNONAA 92 05/09/2020 1120   GFRAA 106 05/09/2020 1120    Lab Results  Component Value Date   WBC 7.4 05/13/2023   HGB 14.6 05/13/2023   HCT 43.3 05/13/2023   MCV 97.3 05/13/2023   PLT 284 05/13/2023   Lab Results  Component Value Date   CHOL 195 05/13/2023   HDL 91 05/13/2023   LDLCALC 87 05/13/2023   TRIG 81 05/13/2023   CHOLHDL 2.1 05/13/2023    Lab Results  Component Value  Date   TSH 1.92 05/13/2023   Results for orders placed or performed in visit on 05/17/23  POCT COVID BINAX NOW CARD   Collection Time: 05/17/23  3:16 PM  Result Value Ref Range   SARS Coronavirus 2 Ag Negative Negative   Assessment & Plan:   Acute Upper Respiratory Infection: Covid-19 NEGATIVE. Sending in 250 mg Azithromycin  - take 2 tablets on Day 1 and 1 tablet on Days 2-5 and 100 mg Tessalon  Perles - take 1 capsule (100 mg total) by mouth 3 (three) times daily as needed for cough.    Refilling Mupirocin  Ointment per patient request.    I,Emily Lagle,acting as a scribe for Ronal JINNY Hailstone, MD.,have documented all relevant documentation on the behalf of Ronal JINNY Hailstone, MD,as directed by  Ronal JINNY Hailstone, MD while in the presence of Ronal JINNY Hailstone, MD.   I, Ronal JINNY Hailstone, MD, have reviewed all documentation for this visit. The documentation on 05/23/23 for the exam, diagnosis, procedures, and orders are all accurate and complete.

## 2023-05-23 ENCOUNTER — Encounter: Payer: Self-pay | Admitting: Internal Medicine

## 2023-05-23 NOTE — Patient Instructions (Signed)
Take Zithromax Z-PAK 2 tabs day 1 followed by 1 tab days 2 through 5.  Take Tessalon Perles 100 mg by mouth 3 times daily as needed for cough.  Refilling mupirocin ointment per patient request.  Rest and stay well-hydrated.  Call if not better in 7 to 10 days or sooner if worse.

## 2023-05-26 ENCOUNTER — Other Ambulatory Visit: Payer: Self-pay | Admitting: Obstetrics and Gynecology

## 2023-05-26 DIAGNOSIS — Z0000 Encounter for general adult medical examination without abnormal findings: Secondary | ICD-10-CM

## 2023-05-28 DIAGNOSIS — D044 Carcinoma in situ of skin of scalp and neck: Secondary | ICD-10-CM | POA: Diagnosis not present

## 2023-05-28 DIAGNOSIS — D485 Neoplasm of uncertain behavior of skin: Secondary | ICD-10-CM | POA: Diagnosis not present

## 2023-06-01 DIAGNOSIS — M0579 Rheumatoid arthritis with rheumatoid factor of multiple sites without organ or systems involvement: Secondary | ICD-10-CM | POA: Diagnosis not present

## 2023-06-02 LAB — LAB REPORT - SCANNED: EGFR: 94

## 2023-06-07 ENCOUNTER — Encounter: Payer: Self-pay | Admitting: Internal Medicine

## 2023-06-07 NOTE — Progress Notes (Signed)
 Medicare Annual Wellness Visit   Patient Care Team: Daneshia Tavano, Ronal PARAS, MD as PCP - General (Internal Medicine) [name] [name], MD as Consulting Physician (Obstetrics and Gynecology)  Visit Date: 06/11/23   Chief Complaint  Patient presents with   Medicare Wellness   Annual Exam   Subjective:  Patient: Katrina [name], Female DOB: 08/26/1955, 69 y.o. MRN: [id number] Katrina [name] is a 68 y.o. Female who presents today for her Medicare Annual Wellness Visit.   Initially her exam was supposed to be on January 13th, however at the time she had been having some congestion and a sore throat that was treated with a ZPAK and Tessalon  Perles. Today she denies any residual or persisting symptoms.   Notes that she has an ingrown right great toenail. Endorses soreness.   History of Rheumatoid Arthritis (CCP positive) treated with Methotrexate 15 mg weekly and 15 mg Meloxicam as needed. Followed by Dr. Mai, Rheumatologist. Per her report: very well managed. I can't feel it.    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   Labs 05/13/23 CBC: WNL CMP: WNL Lipid Panel: WNL TSH: 1.92  PAP Smear 02/06/20 normal. Discontinued due to age.   Mammogram 06/25/22 normal, with note of Breast Density C, and repeat scheduled for 07/19/23.   Bone Density 08/20/22 with T-score -2.3, osteopenic, with repeat recommendation of 2026. History of Osteopenia/Osteoporosis treated with 60 mg Prolia  injected every 6 months, 600 mg Calcium  Carbonate daily, and 1000 units Vitamin-D daily. Took Fosamax for 5 years by GYN for osteopenia but stopped in 2012.   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  Vaccine Counseling: UTD on Shingles, Flu, PNA, Tdap, and Covid-19 Past Medical History:  Diagnosis Date   Arthritis    --rheumatoid   Colon polyp     Osteoporosis 04/05/14   T score -2.6 spine   Salpingitis    Had appendectomy at same time as salpingitis dx was made.  Medical/Surgical History Narrative:  2023 - In December she was seen at [facility] for Closed Fracture of Right Distal Fibula. This was treated with splinting, crutches and nonweightbearing.    2014 - hx of Tonsillectomy and Adenoidectomy   Other - hx of Appendectomy. Hx of Excisional Breast Biopsy on the left, which was benign.   Family History  Problem Relation Age of Onset   Cancer Mother 31       Dec Lung CA   Thyroid disease Mother        thyroidectomy in her 20's--unsure of reason   Cancer Father        Dec Bladder CA age 74   Heart disease Father    Hypertension Father    Hyperlipidemia Father    Stroke Paternal Grandfather    Breast cancer Maternal Aunt    Colon cancer Neg Hx    Pancreatic cancer Neg Hx    Esophageal cancer Neg Hx    Rectal cancer Neg Hx    Stomach cancer Neg Hx   Family History Narrative: Mother died of Lung Cancer.   Father died at age 48 with history of Hypertension, Congestive Heart Failure, Bladder Cancer, and Macular Degeneration. 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  She and her husband has spent considerable time sailing.  They have no children. Exercises  with walking, yoga and swimming. Quit smoking ~30 years ago w/ a 20-pack-year history. Does not drink alcohol.   Review of Systems  Constitutional:  Negative for chills, fever, malaise/fatigue and weight loss.  HENT:  Negative for hearing loss, sinus pain and sore throat.   Respiratory:  Negative for cough, hemoptysis and shortness of breath.   Cardiovascular:  Negative for chest pain, palpitations, leg swelling and PND.  Gastrointestinal:  Negative for abdominal pain, constipation, diarrhea, heartburn, nausea and vomiting.  Genitourinary:  Negative for dysuria, frequency and urgency.   Musculoskeletal:  Negative for back pain, myalgias and neck pain.  Skin:  Negative for itching and rash.       (+) Ingrown toenail - right great toe, some soreness  Neurological:  Negative for dizziness, tingling, seizures and headaches.  Endo/Heme/Allergies:  Negative for polydipsia.  Psychiatric/Behavioral:  Negative for depression. The patient is not nervous/anxious.     Objective:  Vitals: BP 110/70   Pulse 77   Ht 5' 2.5 (1.588 m)   Wt 135 lb (61.2 kg)   LMP 05/04/2002 (Approximate)   SpO2 96%   BMI 24.30 kg/m  Physical Exam Vitals and nursing note reviewed.  Constitutional:      General: She is not in acute distress.    Appearance: Normal appearance. She is not ill-appearing or toxic-appearing.  HENT:     Head: Normocephalic and atraumatic.     Right Ear: Hearing, tympanic membrane, ear canal and external ear normal.     Left Ear: Hearing, tympanic membrane, ear canal and external ear normal.     Mouth/Throat:     Pharynx: Oropharynx is clear.  Eyes:     Extraocular Movements: Extraocular movements intact.     Pupils: Pupils are equal, round, and reactive to light.  Neck:     Thyroid: No thyroid mass, thyromegaly or thyroid tenderness.     Vascular: No carotid bruit.  Cardiovascular:     Rate and Rhythm: Normal rate and regular rhythm. No extrasystoles are present.    Pulses:          Dorsalis pedis pulses are 1+ on the right side and 1+ on the left side.     Heart sounds: Normal heart sounds. No murmur heard.    No friction rub. No gallop.  Pulmonary:     Effort: Pulmonary effort is normal.     Breath sounds: Normal breath sounds. No decreased breath sounds, wheezing, rhonchi or rales.  Chest:     Chest wall: No mass.  Breasts:    Breasts are symmetrical.     Right: No mass.     Left: No mass.  Abdominal:     Palpations: Abdomen is soft. There is no hepatomegaly, splenomegaly or mass.     Tenderness: There is no abdominal tenderness.     Hernia: No hernia  is present.  Musculoskeletal:     Cervical back: Normal range of motion.     Right lower leg: No edema.     Left lower leg: No edema.  Feet:     Right foot:     Toenail Condition: Right toenails are ingrown.     Comments: R. Great toenail ingrown, not infected Lymphadenopathy:     Cervical: No cervical adenopathy.     Upper Body:     Right upper body: No supraclavicular adenopathy.     Left upper body: No supraclavicular adenopathy.  Skin:    General: Skin is warm and dry.  Neurological:  General: No focal deficit present.     Mental Status: She is alert and oriented to person, place, and time. Mental status is at baseline.     Sensory: Sensation is intact.     Motor: Motor function is intact. No weakness.     Deep Tendon Reflexes: Reflexes are normal and symmetric.  Psychiatric:        Attention and Perception: Attention normal.        Mood and Affect: Mood normal.        Speech: Speech normal.        Behavior: Behavior normal.        Thought Content: Thought content normal.        Cognition and Memory: Cognition normal.        Judgment: Judgment normal.   Most Recent Functional Status Assessment:    05/17/2023    2:57 PM  In your present state of health, do you have any difficulty performing the following activities:  Hearing? 0  Vision? 0  Difficulty concentrating or making decisions? 0  Walking or climbing stairs? 0  Dressing or bathing? 0  Doing errands, shopping? 0  Preparing Food and eating ? N  Using the Toilet? N  In the past six months, have you accidently leaked urine? N  Do you have problems with loss of bowel control? N  Managing your Medications? N  Managing your Finances? N  Housekeeping or managing your Housekeeping? N   Most Recent Fall Risk Assessment:    05/17/2023    2:58 PM  Fall Risk   Falls in the past year? 0  Number falls in past yr: 0  Injury with Fall? 0  Risk for fall due to : No Fall Risks  Follow up Falls prevention  discussed;Education provided;Falls evaluation completed   Most Recent Depression Screenings:    05/17/2023    2:58 PM 08/10/2022    3:31 PM  PHQ 2/9 Scores  PHQ - 2 Score 0 0   Most Recent Cognitive Screening:    05/17/2023    3:15 PM  6CIT Screen  What Year? 0 points  What month? 0 points  What time? 0 points  Count back from 20 0 points  Months in reverse 0 points  Repeat phrase 0 points  Total Score 0 points   Results:  Studies Obtained And Personally Reviewed By Me:  PAP Smear 02/06/20 normal.   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; Otherwise normal   Labs:     Component Value Date/Time   NA 141 05/13/2023 0950   K 4.5 05/13/2023 0950   CL 105 05/13/2023 0950   CO2 28 05/13/2023 0950   GLUCOSE 85 05/13/2023 0950   BUN 15 05/13/2023 0950   CREATININE 0.70 05/13/2023 0950   CALCIUM  8.9 05/13/2023 0950   PROT 6.7 05/13/2023 0950   ALBUMIN 4.0 06/04/2016 1101   AST 19 05/13/2023 0950   ALT 16 05/13/2023 0950   ALKPHOS 75 06/04/2016 1101   BILITOT 0.7 05/13/2023 0950   GFRNONAA 92 05/09/2020 1120   GFRAA 106 05/09/2020 1120    Lab Results  Component Value Date   WBC 7.4 05/13/2023   HGB 14.6 05/13/2023   HCT 43.3 05/13/2023   MCV 97.3 05/13/2023   PLT 284 05/13/2023   Lab Results  Component Value Date   CHOL 195  05/13/2023   HDL 91 05/13/2023   LDLCALC 87 05/13/2023   TRIG 81 05/13/2023   CHOLHDL 2.1 05/13/2023   Lab Results  Component Value Date   TSH 1.92 05/13/2023    Assessment & Plan:  Other Labs Reviewed today: CBC: WNL CMP: WNL Lipid Panel: WNL TSH: 1.92  Rheumatoid Arthritis (CCP positive) treated with Methotrexate 15 mg weekly and 15 mg Meloxicam as needed. Followed by Dr. Mai, Rheumatologist.   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   Ingrown Toenail, Right Great Toe: recommended to pad underneath with some cotton and letting toenail grow out. If worsens or becomes infected, let us  know.  PAP Smear 02/06/20 normal. Discontinued due to age.   Mammogram 06/25/22 normal, with note of Breast Density C, and repeat scheduled for 07/19/23.   Bone Density 08/20/22 with T-score -2.3, osteopenic, with repeat recommendation of 2026.   Osteopenia/Osteoporosis treated with 60 mg Prolia  injected every 6 months, 600 mg Calcium  Carbonate daily, and 1000 units Vitamin-D daily.   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  Vaccine Counseling: Due for Covid-19; UTD on Shingles, Flu, PNA, Tdap   Annual wellness visit done today including the all of the following: Reviewed patient's Family Medical History Reviewed and updated list of patient's medical providers Assessment of cognitive impairment was done Assessed patient's functional ability Established a written schedule for health screening services Health Risk Assessent Completed and Reviewed  Discussed health benefits of physical activity, and encouraged her to engage in regular exercise appropriate for her age and condition.    I,Emily Lagle,acting as a neurosurgeon for Ronal JINNY Hailstone, MD.,have documented all relevant documentation on the behalf of Ronal JINNY Hailstone, MD,as directed by  Ronal JINNY Hailstone, MD while in the presence of Ronal JINNY Hailstone, MD.   I, Ronal JINNY Hailstone, MD, have reviewed all documentation for this visit. The documentation on 06/24/23 for the exam, diagnosis, procedures, and orders are all accurate and complete.

## 2023-06-11 ENCOUNTER — Encounter: Payer: Self-pay | Admitting: Internal Medicine

## 2023-06-11 ENCOUNTER — Ambulatory Visit (INDEPENDENT_AMBULATORY_CARE_PROVIDER_SITE_OTHER): Payer: Medicare HMO | Admitting: Internal Medicine

## 2023-06-11 VITALS — BP 110/70 | HR 77 | Ht 62.5 in | Wt 135.0 lb

## 2023-06-11 DIAGNOSIS — R931 Abnormal findings on diagnostic imaging of heart and coronary circulation: Secondary | ICD-10-CM

## 2023-06-11 DIAGNOSIS — Z0000 Encounter for general adult medical examination without abnormal findings: Secondary | ICD-10-CM | POA: Diagnosis not present

## 2023-06-11 DIAGNOSIS — M0579 Rheumatoid arthritis with rheumatoid factor of multiple sites without organ or systems involvement: Secondary | ICD-10-CM

## 2023-06-11 DIAGNOSIS — M8580 Other specified disorders of bone density and structure, unspecified site: Secondary | ICD-10-CM

## 2023-06-11 DIAGNOSIS — L600 Ingrowing nail: Secondary | ICD-10-CM | POA: Diagnosis not present

## 2023-06-11 DIAGNOSIS — M810 Age-related osteoporosis without current pathological fracture: Secondary | ICD-10-CM

## 2023-06-11 DIAGNOSIS — K146 Glossodynia: Secondary | ICD-10-CM | POA: Diagnosis not present

## 2023-06-11 LAB — POCT URINALYSIS DIP (CLINITEK)
Bilirubin, UA: NEGATIVE
Blood, UA: NEGATIVE
Glucose, UA: NEGATIVE mg/dL
Ketones, POC UA: NEGATIVE mg/dL
Leukocytes, UA: NEGATIVE
Nitrite, UA: NEGATIVE
POC PROTEIN,UA: NEGATIVE
Spec Grav, UA: 1.01 (ref 1.010–1.025)
Urobilinogen, UA: 0.2 U/dL
pH, UA: 6.5 (ref 5.0–8.0)

## 2023-06-22 DIAGNOSIS — L821 Other seborrheic keratosis: Secondary | ICD-10-CM | POA: Diagnosis not present

## 2023-06-22 DIAGNOSIS — Z85828 Personal history of other malignant neoplasm of skin: Secondary | ICD-10-CM | POA: Diagnosis not present

## 2023-06-22 DIAGNOSIS — D2371 Other benign neoplasm of skin of right lower limb, including hip: Secondary | ICD-10-CM | POA: Diagnosis not present

## 2023-06-22 DIAGNOSIS — D229 Melanocytic nevi, unspecified: Secondary | ICD-10-CM | POA: Diagnosis not present

## 2023-06-22 DIAGNOSIS — D485 Neoplasm of uncertain behavior of skin: Secondary | ICD-10-CM | POA: Diagnosis not present

## 2023-06-22 DIAGNOSIS — D2271 Melanocytic nevi of right lower limb, including hip: Secondary | ICD-10-CM | POA: Diagnosis not present

## 2023-06-22 DIAGNOSIS — Z86018 Personal history of other benign neoplasm: Secondary | ICD-10-CM | POA: Diagnosis not present

## 2023-06-22 DIAGNOSIS — L578 Other skin changes due to chronic exposure to nonionizing radiation: Secondary | ICD-10-CM | POA: Diagnosis not present

## 2023-06-22 DIAGNOSIS — L570 Actinic keratosis: Secondary | ICD-10-CM | POA: Diagnosis not present

## 2023-06-24 NOTE — Patient Instructions (Addendum)
 It was a pleasure to see you as always.Labs reviewed and appear stable. Return in one year of as needed.

## 2023-06-24 NOTE — Progress Notes (Incomplete)
 Medicare Annual Wellness Visit   Patient Care Team: Katrina [name], Katrina Cole, MD as PCP - General (Internal Medicine) Katrina Salles, MD as Consulting Physician (Obstetrics and Gynecology)  Visit Date: 06/11/23   Chief Complaint  Patient presents with  . Medicare Wellness  . Annual Exam   Subjective:  Patient: Katrina [name], Female DOB: 09-17-1955, 68 y.o. MRN: [id number] Katrina [name] is a 68 y.o. Female who presents today for her Medicare Annual Wellness Visit.   Initially her exam was supposed to be on January 13th, however at the time she had been having some congestion and a sore throat that was treated with a ZPAK and Tessalon Perles. Today she denies any residual or persisting symptoms.   Notes that she has an ingrown right great toenail. Endorses soreness.   History of Rheumatoid Arthritis (CCP positive) treated with Methotrexate 15 mg weekly and 15 mg Meloxicam as needed. Followed by Dr. Dierdre [name], Rheumatologist. Per her report: "very well managed. 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   Labs 05/13/23 CBC: WNL CMP: WNL Lipid Panel: WNL TSH: 1.92  PAP Smear 02/06/20 normal. Discontinued due to age.   Mammogram 06/25/22 normal, with note of Breast Density C, and repeat scheduled for 07/19/23.   Bone Density 08/20/22 with T-score -2.3, osteopenic, with repeat recommendation of 2026. History of Osteopenia/Osteoporosis treated with 60 mg Prolia injected every 6 months, 600 mg Calcium Carbonate daily, and 1000 units Vitamin-D daily. Took Fosamax for 5 years by GYN for osteopenia but stopped in 2012.   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  Vaccine Counseling: UTD on Shingles, Flu, PNA, Tdap, and Covid-19 Past Medical History:  Diagnosis Date  . Arthritis    --rheumatoid  . Colon polyp   .  Osteoporosis 04/05/14   T score -2.6 spine  . Salpingitis    Had appendectomy at same time as salpingitis dx was made.  Medical/Surgical History Narrative:  2023 - In December she was seen at [facility] for Closed Fracture of Right Distal Fibula. This was treated with splinting, crutches and nonweightbearing.    2014 - hx of Tonsillectomy and Adenoidectomy   Other - hx of Appendectomy. Hx of Excisional Breast Biopsy on the left, which was benign.   Family History  Problem Relation Age of Onset  . Cancer Mother 59       Dec Lung CA  . Thyroid disease Mother        thyroidectomy in her 20's--unsure of reason  . Cancer Father        Dec Bladder CA age 3  . Heart disease Father   . Hypertension Father   . Hyperlipidemia Father   . Stroke Paternal Grandfather   . Breast cancer Maternal Aunt   . Colon cancer Neg Hx   . Pancreatic cancer Neg Hx   . Esophageal cancer Neg Hx   . Rectal cancer Neg Hx   . Stomach cancer Neg Hx   Family History Narrative: Mother died of Lung Cancer.   Father died at age 46 with history of Hypertension, Congestive Heart Failure, Bladder Cancer, and Macular Degeneration. 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  She and her husband has spent considerable time sailing.  They have no children. Exercises  with walking, yoga and swimming. Quit smoking ~30 years ago w/ a 20-pack-year history. Does not drink alcohol.   Review of Systems  Constitutional:  Negative for chills, fever, malaise/fatigue and weight loss.  HENT:  Negative for hearing loss, sinus pain and sore throat.   Respiratory:  Negative for cough, hemoptysis and shortness of breath.   Cardiovascular:  Negative for chest pain, palpitations, leg swelling and PND.  Gastrointestinal:  Negative for abdominal pain, constipation, diarrhea, heartburn, nausea and vomiting.  Genitourinary:  Negative for dysuria, frequency and urgency.   Musculoskeletal:  Negative for back pain, myalgias and neck pain.  Skin:  Negative for itching and rash.       (+) Ingrown toenail - right great toe, some soreness  Neurological:  Negative for dizziness, tingling, seizures and headaches.  Endo/Heme/Allergies:  Negative for polydipsia.  Psychiatric/Behavioral:  Negative for depression. The patient is not nervous/anxious.     Objective:  Vitals: BP 110/70   Pulse 77   Ht 5' 2.5" (1.588 m)   Wt 135 lb (61.2 kg)   LMP 05/04/2002 (Approximate)   SpO2 96%   BMI 24.30 kg/m  Physical Exam Vitals and nursing note reviewed.  Constitutional:      General: She is not in acute distress.    Appearance: Normal appearance. She is not ill-appearing or toxic-appearing.  HENT:     Head: Normocephalic and atraumatic.     Right Ear: Hearing, tympanic membrane, ear canal and external ear normal.     Left Ear: Hearing, tympanic membrane, ear canal and external ear normal.     Mouth/Throat:     Pharynx: Oropharynx is clear.  Eyes:     Extraocular Movements: Extraocular movements intact.     Pupils: Pupils are equal, round, and reactive to light.  Neck:     Thyroid: No thyroid mass, thyromegaly or thyroid tenderness.     Vascular: No carotid bruit.  Cardiovascular:     Rate and Rhythm: Normal rate and regular rhythm. No extrasystoles are present.    Pulses:          Dorsalis pedis pulses are 1+ on the right side and 1+ on the left side.     Heart sounds: Normal heart sounds. No murmur heard.    No friction rub. No gallop.  Pulmonary:     Effort: Pulmonary effort is normal.     Breath sounds: Normal breath sounds. No decreased breath sounds, wheezing, rhonchi or rales.  Chest:     Chest wall: No mass.  Breasts:    Breasts are symmetrical.     Right: No mass.     Left: No mass.  Abdominal:     Palpations: Abdomen is soft. There is no hepatomegaly, splenomegaly or mass.     Tenderness: There is no abdominal tenderness.     Hernia: No hernia  is present.  Musculoskeletal:     Cervical back: Normal range of motion.     Right lower leg: No edema.     Left lower leg: No edema.  Feet:     Right foot:     Toenail Condition: Right toenails are ingrown.     Comments: R. Great toenail ingrown, not infected Lymphadenopathy:     Cervical: No cervical adenopathy.     Upper Body:     Right upper body: No supraclavicular adenopathy.     Left upper body: No supraclavicular adenopathy.  Skin:    General: Skin is warm and dry.  Neurological:  General: No focal deficit present.     Mental Status: She is alert and oriented to person, place, and time. Mental status is at baseline.     Sensory: Sensation is intact.     Motor: Motor function is intact. No weakness.     Deep Tendon Reflexes: Reflexes are normal and symmetric.  Psychiatric:        Attention and Perception: Attention normal.        Mood and Affect: Mood normal.        Speech: Speech normal.        Behavior: Behavior normal.        Thought Content: Thought content normal.        Cognition and Memory: Cognition normal.        Judgment: Judgment normal.   Most Recent Functional Status Assessment:    05/17/2023    2:57 PM  In your present state of health, do you have any difficulty performing the following activities:  Hearing? 0  Vision? 0  Difficulty concentrating or making decisions? 0  Walking or climbing stairs? 0  Dressing or bathing? 0  Doing errands, shopping? 0  Preparing Food and eating ? N  Using the Toilet? N  In the past six months, have you accidently leaked urine? N  Do you have problems with loss of bowel control? N  Managing your Medications? N  Managing your Finances? N  Housekeeping or managing your Housekeeping? N   Most Recent Fall Risk Assessment:    05/17/2023    2:58 PM  Fall Risk   Falls in the past year? 0  Number falls in past yr: 0  Injury with Fall? 0  Risk for fall due to : No Fall Risks  Follow up Falls prevention  discussed;Education provided;Falls evaluation completed   Most Recent Depression Screenings:    05/17/2023    2:58 PM 08/10/2022    3:31 PM  PHQ 2/9 Scores  PHQ - 2 Score 0 0   Most Recent Cognitive Screening:    05/17/2023    3:15 PM  6CIT Screen  What Year? 0 points  What month? 0 points  What time? 0 points  Count back from 20 0 points  Months in reverse 0 points  Repeat phrase 0 points  Total Score 0 points   Results:  Studies Obtained And Personally Reviewed By Me:  PAP Smear 02/06/20 normal.   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; Otherwise normal   Labs:     Component Value Date/Time   NA 141 05/13/2023 0950   K 4.5 05/13/2023 0950   CL 105 05/13/2023 0950   CO2 28 05/13/2023 0950   GLUCOSE 85 05/13/2023 0950   BUN 15 05/13/2023 0950   CREATININE 0.70 05/13/2023 0950   CALCIUM 8.9 05/13/2023 0950   PROT 6.7 05/13/2023 0950   ALBUMIN 4.0 06/04/2016 1101   AST 19 05/13/2023 0950   ALT 16 05/13/2023 0950   ALKPHOS 75 06/04/2016 1101   BILITOT 0.7 05/13/2023 0950   GFRNONAA 92 05/09/2020 1120   GFRAA 106 05/09/2020 1120    Lab Results  Component Value Date   WBC 7.4 05/13/2023   HGB 14.6 05/13/2023   HCT 43.3 05/13/2023   MCV 97.3 05/13/2023   PLT 284 05/13/2023   Lab Results  Component Value Date   CHOL 195  05/13/2023   HDL 91 05/13/2023   LDLCALC 87 05/13/2023   TRIG 81 05/13/2023   CHOLHDL 2.1 05/13/2023   Lab Results  Component Value Date   TSH 1.92 05/13/2023    Assessment & Plan:  Other Labs Reviewed today: CBC: WNL CMP: WNL Lipid Panel: WNL TSH: 1.92  Rheumatoid Arthritis (CCP positive) treated with Methotrexate 15 mg weekly and 15 mg Meloxicam as needed. Followed by Dr. Dierdre [name], Rheumatologist.   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   Ingrown Toenail, Right Great Toe: recommended to pad underneath with some cotton and letting toenail grow out. If worsens or becomes infected, let us know.  PAP Smear 02/06/20 normal. Discontinued due to age.   Mammogram 06/25/22 normal, with note of Breast Density C, and repeat scheduled for 07/19/23.   Bone Density 08/20/22 with T-score -2.3, osteopenic, with repeat recommendation of 2026.   Osteopenia/Osteoporosis treated with 60 mg Prolia injected every 6 months, 600 mg Calcium Carbonate daily, and 1000 units Vitamin-D daily.   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  Vaccine Counseling: Due for Covid-19; UTD on Shingles, Flu, PNA, Tdap   Annual wellness visit done today including the all of the following: Reviewed patient's Family Medical History Reviewed and updated list of patient's medical providers Assessment of cognitive impairment was done Assessed patient's functional ability Established a written schedule for health screening services Health Risk Assessent Completed and Reviewed  Discussed health benefits of physical activity, and encouraged her to engage in regular exercise appropriate for her age and condition.    I,Emily Lagle,acting as a Neurosurgeon for Margaree Mackintosh, MD.,have documented all relevant documentation on the behalf of Margaree Mackintosh, MD,as directed by  Margaree Mackintosh, MD while in the presence of Margaree Mackintosh, MD.   ***

## 2023-07-14 DIAGNOSIS — D044 Carcinoma in situ of skin of scalp and neck: Secondary | ICD-10-CM | POA: Diagnosis not present

## 2023-07-19 ENCOUNTER — Ambulatory Visit
Admission: RE | Admit: 2023-07-19 | Discharge: 2023-07-19 | Disposition: A | Discharge status: Home / Self Care | Payer: Medicare HMO | Source: Ambulatory Visit | Attending: Obstetrics and Gynecology | Admitting: Obstetrics and Gynecology

## 2023-07-19 DIAGNOSIS — Z1231 Encounter for screening mammogram for malignant neoplasm of breast: Secondary | ICD-10-CM | POA: Diagnosis not present

## 2023-07-19 DIAGNOSIS — Z0000 Encounter for general adult medical examination without abnormal findings: Secondary | ICD-10-CM

## 2023-07-20 ENCOUNTER — Encounter: Payer: Self-pay | Admitting: Podiatry

## 2023-07-20 ENCOUNTER — Ambulatory Visit: Admitting: Podiatry

## 2023-07-20 VITALS — Ht 62.5 in | Wt 135.0 lb

## 2023-07-20 DIAGNOSIS — M0579 Rheumatoid arthritis with rheumatoid factor of multiple sites without organ or systems involvement: Secondary | ICD-10-CM

## 2023-07-20 DIAGNOSIS — M2061 Acquired deformities of toe(s), unspecified, right foot: Secondary | ICD-10-CM | POA: Diagnosis not present

## 2023-07-21 ENCOUNTER — Encounter: Payer: Self-pay | Admitting: Obstetrics and Gynecology

## 2023-07-26 ENCOUNTER — Encounter: Payer: Self-pay | Admitting: Podiatry

## 2023-07-26 NOTE — Progress Notes (Signed)
 Subjective: HEAVENLEIGH PETRUZZI presents today referred by Margaree Mackintosh, MD for complaint of at risk foot care. Patient has history of RA and is on immunosuppresive medication .   Past Medical History:  Diagnosis Date   Arthritis    --rheumatoid   Colon polyp    Osteoporosis 04/05/14   T score -2.6 spine   Salpingitis    Had appendectomy at same time as salpingitis dx was made.     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     calcium carbonate (OS-CAL) 600 MG TABS Take 600 mg by mouth daily.     cholecalciferol (VITAMIN D) 1000 UNITS tablet Take by mouth daily.     clonazePAM (KLONOPIN) 0.1 mg/mL SUSP Take 5 mLs (0.5 mg total) by mouth 2 (two) times daily as needed. 300 mL 1   denosumab (PROLIA) 60 MG/ML SOSY injection Inject 60 mg into the skin every 6 (six) months.     folic acid (FOLVITE) 1 MG tablet Take 2 mg by mouth daily.     meloxicam (MOBIC) 15 MG tablet Take 1 tablet by mouth as needed.     methotrexate (RHEUMATREX) 2.5 MG tablet Take 15 mg by mouth once a week. Caution:Chemotherapy. Protect from light.  PRN     No current facility-administered medications on file prior to visit.     No Known Allergies   Social History   Occupational History   Not on file  Tobacco Use   Smoking status: Former     Current packs/day: 0.00    Average packs/day: 1 pack/day for 20.0 years (20.0 ttl pk-yrs)    Types: Cigarettes    Start date: 05/04/1970    Quit date: 05/04/1990    Years since quitting: 33.2   Smokeless tobacco: Never  Vaping Use   Vaping status: Never Used  Substance and Sexual Activity   Alcohol use: No    Alcohol/week: 0.0 standard drinks of alcohol   Drug use: No   Sexual activity: Not Currently    Partners: Male    Birth control/protection: Post-menopausal     Family History  Problem Relation Age of Onset   Thyroid disease Mother        thyroidectomy in her 20's--unsure of reason   Lung cancer Mother 33   Heart disease Father    Hypertension Father    Hyperlipidemia Father    Bladder Cancer Father 2   Breast cancer Maternal Aunt 33 - 79   Stroke Paternal Grandfather    Colon cancer Neg Hx    Pancreatic cancer Neg Hx    Esophageal cancer Neg Hx    Rectal cancer Neg Hx    Stomach cancer Neg Hx      Immunization History  Administered Date(s) Administered   Fluad Quad(high Dose 65+) 02/24/2023   Influenza Split 03/10/2011, 01/27/2012   Influenza Whole 02/01/2010   Influenza, Quadrivalent, Recombinant, Inj, Pf 02/01/2019   Influenza,inj,Quad PF,6+ Mos 03/08/2013, 02/28/2014, 02/26/2015, 02/25/2016, 02/16/2017, 02/01/2018, 02/24/2023   Influenza-Unspecified 02/01/2019, 02/26/2022   Moderna Covid-19 Vaccine Bivalent Booster 24yrs & up 12/30/2022   PFIZER(Purple Top)SARS-COV-2 Vaccination 07/13/2019, 08/03/2019, 02/07/2020   PNEUMOCOCCAL CONJUGATE-20 05/14/2022   Pfizer(Comirnaty)Fall Seasonal Vaccine 12 years and older 01/20/2022   Pneumococcal Conjugate-13 05/13/2015   Pneumococcal Polysaccharide-23 02/01/2009   RSV,unspecified 04/01/2022   Tdap 04/17/2010, 05/12/2021   Zoster Recombinant(Shingrix) 09/29/2022, 03/13/2023     Objective: There were no vitals filed for this visit.  RILLA BUCKMAN is a pleasant 68 y.o. female WD, WN in NAD. AAO x 3.  Vascular  Examination: Vascular status intact b/l with palpable pedal pulses. Pedal hair present b/l. CFT immediate b/l. No edema. No pain with calf compression b/l. Skin temperature gradient WNL b/l.   Neurological Examination: Sensation grossly intact b/l with 10 gram monofilament. Vibratory sensation intact b/l.   Dermatological Examination: Pedal skin with normal turgor, texture and tone b/l. Nondystrophic toenails 1-5 bilaterally. No corns, calluses nor porokeratotic lesions noted.  Musculoskeletal Examination: Muscle strength 5/5 to b/l LE.  Medial dislocation of right 2nd digit at MPJ.  Radiographs: None  Assessment: 1. Rheumatoid arthritis involving multiple sites with positive rheumatoid factor (HCC)   2. Acquired toe deformity, right     Plan: -Patient was evaluated today. All questions/concerns addressed on today's visit. -Toenails trimmed as a courstesy. -Discussed toe deformity right 2nd digit. Asymptomatic. Monitor. -Patient/POA to call should there be question/concern in the interim.  Return if symptoms worsen or fail to improve.  Freddie Breech, DPM      [facility] LOCATION: [address], [place] [address]                   Office [phone]   [facility] LOCATION: [address] [place], [place] [address] Office [phone]

## 2023-08-18 DIAGNOSIS — D485 Neoplasm of uncertain behavior of skin: Secondary | ICD-10-CM | POA: Diagnosis not present

## 2023-08-18 DIAGNOSIS — D0471 Carcinoma in situ of skin of right lower limb, including hip: Secondary | ICD-10-CM | POA: Diagnosis not present

## 2023-08-18 DIAGNOSIS — D2271 Melanocytic nevi of right lower limb, including hip: Secondary | ICD-10-CM | POA: Diagnosis not present

## 2023-09-02 DIAGNOSIS — M25562 Pain in left knee: Secondary | ICD-10-CM | POA: Diagnosis not present

## 2023-09-02 DIAGNOSIS — M0579 Rheumatoid arthritis with rheumatoid factor of multiple sites without organ or systems involvement: Secondary | ICD-10-CM | POA: Diagnosis not present

## 2023-09-02 DIAGNOSIS — M810 Age-related osteoporosis without current pathological fracture: Secondary | ICD-10-CM | POA: Diagnosis not present

## 2023-09-02 DIAGNOSIS — Z6823 Body mass index (BMI) 23.0-23.9, adult: Secondary | ICD-10-CM | POA: Diagnosis not present

## 2023-09-02 DIAGNOSIS — M1991 Primary osteoarthritis, unspecified site: Secondary | ICD-10-CM | POA: Diagnosis not present

## 2023-09-03 LAB — LAB REPORT - SCANNED: EGFR: 95

## 2023-09-13 DIAGNOSIS — D485 Neoplasm of uncertain behavior of skin: Secondary | ICD-10-CM | POA: Diagnosis not present

## 2023-09-13 DIAGNOSIS — D487 Neoplasm of uncertain behavior of other specified sites: Secondary | ICD-10-CM | POA: Diagnosis not present

## 2023-09-29 ENCOUNTER — Ambulatory Visit: Payer: Medicare HMO

## 2023-10-05 ENCOUNTER — Ambulatory Visit (INDEPENDENT_AMBULATORY_CARE_PROVIDER_SITE_OTHER)

## 2023-10-05 VITALS — BP 111/70 | HR 70 | Temp 98.0°F | Resp 16 | Ht 62.0 in | Wt 134.4 lb

## 2023-10-05 DIAGNOSIS — M810 Age-related osteoporosis without current pathological fracture: Secondary | ICD-10-CM | POA: Diagnosis not present

## 2023-10-05 MED ORDER — DENOSUMAB 60 MG/ML ~~LOC~~ SOSY
60.0000 mg | PREFILLED_SYRINGE | Freq: Once | SUBCUTANEOUS | Status: AC
  Administered 2023-10-05: 60 mg via SUBCUTANEOUS

## 2023-10-05 NOTE — Progress Notes (Signed)
 Diagnosis: Osteoporosis  Provider:  Mannam, Praveen MD  Procedure: Injection  Prolia  (Denosumab ), Dose: 60 mg, Site: subcutaneous, Number of injections: 1  Injection Site(s): Right arm  Post Care: Patient declined observation  Discharge: Condition: Good, Destination: Home . AVS Provided  Performed by:  Rachelle Bue, RN

## 2023-10-26 DIAGNOSIS — D0471 Carcinoma in situ of skin of right lower limb, including hip: Secondary | ICD-10-CM | POA: Diagnosis not present

## 2023-11-08 ENCOUNTER — Encounter: Payer: Self-pay | Admitting: Internal Medicine

## 2023-11-09 DIAGNOSIS — D2262 Melanocytic nevi of left upper limb, including shoulder: Secondary | ICD-10-CM | POA: Diagnosis not present

## 2023-11-09 DIAGNOSIS — C44329 Squamous cell carcinoma of skin of other parts of face: Secondary | ICD-10-CM | POA: Diagnosis not present

## 2023-11-09 DIAGNOSIS — D485 Neoplasm of uncertain behavior of skin: Secondary | ICD-10-CM | POA: Diagnosis not present

## 2023-11-10 ENCOUNTER — Other Ambulatory Visit: Payer: Self-pay

## 2023-11-10 MED ORDER — CLONAZEPAM 0.1 MG/ML ORAL SUSPENSION: 0.5000 mg | Freq: Two times a day (BID) | ORAL | 1 refills | Status: DC | PRN

## 2023-11-17 ENCOUNTER — Ambulatory Visit: Payer: Self-pay

## 2023-11-17 NOTE — Telephone Encounter (Signed)
 FYI Only or Action Required?: FYI only for provider.  Patient was last seen in primary care on 06/11/2023 by [name], MD.  Called Nurse Triage reporting Rectal Pain and Stool Color Change.  Symptoms began x couple weeks.  Interventions attempted: OTC medications: Miralax, Benefiber, Preperation H and Other: warm Sitz bath.  Symptoms are: mild rectal pain and itching, one spot of rectal bleeding on toilet tissue, constipation (smaller amount of stool and harder to push out) unchanged.  Triage Disposition: See PCP When Office is Open (Within 3 Days)  Patient/caregiver understands and will follow disposition?: Yes             Copied from CRM [id number]. Topic: Clinical - Red Word Triage >> Nov 17, 2023 11:53 AM Tobias CROME wrote: Red Word that prompted transfer to Nurse Triage: patient having rectal pain & stool is not normal Reason for Disposition  [1] Home treatment > 3 days for rectal pain AND [2] not improved  Answer Assessment - Initial Assessment Questions 1. SYMPTOM:  What's the main symptom you're concerned about? (e.g., pain, itching, swelling, rash)     Constant rectal pain, stool is smaller in amount (thinner strands of stool), hemorrhoid bleeding (a single spot on toilet paper).  2. ONSET: When did the symptoms  start?     X couple weeks.  3. RECTAL PAIN: Do you have any pain around your rectum? How bad is the pain?  (Scale 0-10; or none, mild, moderate, severe)     Yes, 3-4/10. She states its uncomfortable to sit or lie in bed.  4. RECTAL ITCHING: Do you have any itching in this area? How bad is the itching?  (Scale 0-10; or none, mild, moderate, severe)     Yes, mild a touch of it.  5. CONSTIPATION: Do you have constipation? If Yes, ask: How often do you have a bowel movement (BM)?  (Normal range: 3 times a day to every 3 days)  When was your last BM?       Yes. Last bowel movement was today. She states she goes once to twice daily but the  amount is not enough and it feels like it is not pushing out as smoothly as it normally does.  6. CAUSE: What do you think is causing the anus symptoms?     Constipation and hemorrhoid.  7. OTHER SYMPTOMS: Do you have any other symptoms?  (e.g., abdomen pain, fever, rectal bleeding, vomiting)     Patient denies abdominal pain, fever. She states mild bloating.  8. PREGNANCY: Is there any chance you are pregnant? When was your last menstrual period?     N/A.  Protocols used: Rectal Symptoms-A-AH

## 2023-11-18 ENCOUNTER — Ambulatory Visit: Admitting: Internal Medicine

## 2023-11-18 VITALS — BP 108/78 | HR 77 | Ht 62.5 in | Wt 133.0 lb

## 2023-11-18 DIAGNOSIS — K146 Glossodynia: Secondary | ICD-10-CM

## 2023-11-18 DIAGNOSIS — K5901 Slow transit constipation: Secondary | ICD-10-CM | POA: Diagnosis not present

## 2023-11-18 DIAGNOSIS — Z8249 Family history of ischemic heart disease and other diseases of the circulatory system: Secondary | ICD-10-CM

## 2023-11-18 DIAGNOSIS — R931 Abnormal findings on diagnostic imaging of heart and coronary circulation: Secondary | ICD-10-CM | POA: Diagnosis not present

## 2023-11-18 DIAGNOSIS — K649 Unspecified hemorrhoids: Secondary | ICD-10-CM | POA: Diagnosis not present

## 2023-11-18 DIAGNOSIS — M0579 Rheumatoid arthritis with rheumatoid factor of multiple sites without organ or systems involvement: Secondary | ICD-10-CM

## 2023-11-18 DIAGNOSIS — K625 Hemorrhage of anus and rectum: Secondary | ICD-10-CM

## 2023-11-18 DIAGNOSIS — M8580 Other specified disorders of bone density and structure, unspecified site: Secondary | ICD-10-CM | POA: Diagnosis not present

## 2023-11-18 LAB — CBC WITH DIFFERENTIAL/PLATELET
Absolute Lymphocytes: 2945 {cells}/uL (ref 850–3900)
Absolute Monocytes: 969 {cells}/uL — ABNORMAL HIGH (ref 200–950)
Basophils Absolute: 124 {cells}/uL (ref 0–200)
Basophils Relative: 1.3 %
Eosinophils Absolute: 162 {cells}/uL (ref 15–500)
Eosinophils Relative: 1.7 %
HCT: 45.5 % — ABNORMAL HIGH (ref 35.0–45.0)
Hemoglobin: 14.7 g/dL (ref 11.7–15.5)
MCH: 32.4 pg (ref 27.0–33.0)
MCHC: 32.3 g/dL (ref 32.0–36.0)
MCV: 100.2 fL — ABNORMAL HIGH (ref 80.0–100.0)
MPV: 9.9 fL (ref 7.5–12.5)
Monocytes Relative: 10.2 %
Neutro Abs: 5301 {cells}/uL (ref 1500–7800)
Neutrophils Relative %: 55.8 %
Platelets: 282 Thousand/uL (ref 140–400)
RBC: 4.54 Million/uL (ref 3.80–5.10)
RDW: 13.1 % (ref 11.0–15.0)
Total Lymphocyte: 31 %
WBC: 9.5 Thousand/uL (ref 3.8–10.8)

## 2023-11-18 MED ORDER — HYDROCORT-PRAMOXINE (PERIANAL) 1-1 % EX FOAM: 1.0000 | Freq: Two times a day (BID) | CUTANEOUS | 1 refills | Status: DC

## 2023-11-18 MED ORDER — CLONAZEPAM 0.1 MG/ML ORAL SUSPENSION: 0.5000 mg | Freq: Two times a day (BID) | ORAL | 1 refills | Status: DC | PRN

## 2023-11-18 NOTE — Progress Notes (Addendum)
 Patient Care Team: Katrina Ronal PARAS, MD as PCP - General (Internal Medicine) Katrina [name] Katrina Bobie FORBES, MD as Consulting Physician (Obstetrics and Gynecology)  Visit Date: 11/18/23  Subjective:   Chief Complaint  Patient presents with   Rectal Pain    For a couple of weeks, hemorrhoids, had a colonoscopy 2 1/2 yrs ago she was told she had an internal hemorrhoids.  Feels like she can't have a full BM, has benefiber every morning and miralax. BM diameter seems smaller.    Patient PI:Katrina [name],Female DOB:01-06-1956,68 y.o. FMW:[id number]   68 y.o.Female presents today for acute sick visit with Rectal Itching. Patient has a past medical history of Internal Hemorrhoids; Constipation; Colon Polyp. She says that itching has been ongoing for the past couple of weeks, feels like she can't have a full BM despite using Benefiber and Miralax, noticed her BMs seems smaller, and has had occasional bleeding during/after a bowel movement. Says there was one instance that her stool was darker recently, but notes that she also was travelling, and any changes in her schedule tend to cause constipation. 05/13/2020 Colonoscopy showed: 1. perianal/digital rectal examinations normal. 2. Non-bleeding internal hemorrhoids. 3. 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 Exam was otherwise without abnormality on direct & retroflexion views.  Past Medical History:  Diagnosis Date   Arthritis    --rheumatoid   Colon polyp    Osteoporosis 04/05/14   T score -2.6 spine   Salpingitis    Had appendectomy at same time as salpingitis dx was made.  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  She and her husband has spent considerable time sailing.  They have no children. Exercises with walking, yoga and swimming. Quit smoking ~30 years ago w/ a 20-pack-year history. Does not drink alcohol.   Review of Systems  Gastrointestinal:        (+) Stool Retention (feels like she can't have a full BM despite using Benefiber and Miralax)  (+) Change in BMs (seems smaller) (+) occasional bleeding during/after a bowel movement.  (+) Darkened Stool (one instance)   Skin:  Positive for itching.  All other systems reviewed and are negative.    Objective:  Vitals: BP 108/78   Pulse 77   Ht 5' 2.5 (1.588 m)   Wt 133 lb (60.3 kg)   LMP 05/04/2002 (Approximate)   SpO2 98%   BMI 23.94 kg/m   Physical Exam Vitals and nursing note reviewed.  Constitutional:      General: She is not in acute distress.    Appearance: Normal appearance. She is not toxic-appearing.  HENT:     Head: Normocephalic and atraumatic.  Pulmonary:     Effort: Pulmonary effort is normal.  Genitourinary:    Rectum:  Internal hemorrhoid (non-bleeding) present.     Comments: Visualized perirectal erythema, internal erythema; no bright red blood    Skin:    General: Skin is warm and dry.  Neurological:     Mental Status: She is alert and oriented to person, place, and time. Mental status is at baseline.  Psychiatric:        Mood and Affect: Mood normal.        Behavior: Behavior normal.        Thought Content: Thought content normal.        Judgment: Judgment normal.     Results:  Studies Obtained And Personally Reviewed By Me:  05/13/2020 Colonoscopy showed: 1. perianal/digital rectal examinations normal. 2. Non-bleeding internal hemorrhoids. 3. A single medium-sized AVM is present in the  cecum. Exam was otherwise without abnormality on direct & retroflexion views.  Labs:     Component Value Date/Time   NA 141 05/13/2023 0950   K 4.5 05/13/2023 0950   CL 105 05/13/2023 0950   CO2 28 05/13/2023 0950   GLUCOSE 85 05/13/2023 0950   BUN 15 05/13/2023 0950   CREATININE 0.70 05/13/2023 0950   CALCIUM 8.9 05/13/2023 0950   PROT 6.7 05/13/2023 0950   ALBUMIN 4.0 06/04/2016 1101   AST 19 05/13/2023 0950   ALT 16 05/13/2023 0950   ALKPHOS 75 06/04/2016 1101   BILITOT 0.7 05/13/2023 0950   GFRNONAA 92 05/09/2020 1120   GFRAA 106 05/09/2020 1120    Lab Results  Component Value Date   WBC 7.4 05/13/2023   HGB 14.6 05/13/2023   HCT 43.3 05/13/2023   MCV 97.3 05/13/2023   PLT 284 05/13/2023   Lab Results  Component Value Date   CHOL 195 05/13/2023   HDL 91 05/13/2023   LDLCALC 87 05/13/2023   TRIG 81 05/13/2023   CHOLHDL 2.1 05/13/2023   Lab Results  Component Value Date   TSH 1.92 05/13/2023    Assessment & Plan:   Meds ordered this encounter  Medications   clonazePAM  (KLONOPIN ) 0.1 mg/mL SUSP    Sig: Take 5 mLs (0.5 mg total) by mouth 2 (two) times daily as needed.    Dispense:  300 mL    Refill:  1   hydrocortisone -pramoxine (PROCTOFOAM-HC) rectal foam    Sig: Place 1 applicator rectally 2 (two) times daily.    Dispense:  10 g    Refill:  1  Anoscopy performed- see below Internal Non-bleeding Hemorrhoids: past medical history of Internal Hemorrhoids; Constipation; Colon Polyp. Reportedly rectal itching began a few weeks ago, associated with feeling like she can't have a full BM despite using Benefiber and Miralax, BMs seeming smaller, occasional bleeding during/after a bowel movement. There was apparently one instance that her stool was darker recently, but notably she has been travelling, and any changes in her schedule tend to cause constipation. Reviewed 05/13/2020 Colonoscopy with pertinent findings of non-bleeding internal hemorrhoids and a single  medium-sized AVM present in the cecum. Similar findings on physical exam in-office today with non-bleeding internal hemorrhoids with additional findings of perirectal erythema and internal erythema, but no bright red blood visualized. Sending in Proctofoam-HC to place applicator rectally twice daily.   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   Hx of Rheumatoid arthritis seen by Dr. Mai. Treated with Humira and MTX  Osteopenia with bone density -2.3 treated with Prolia   Hx of burning mouth syndrome treated with Klonopin  liquid 5ccs twice daily as needed. New  Rx written     I,Emily Lagle,acting as a scribe for Ronal JINNY Hailstone, MD.,have documented all relevant documentation on the behalf of Ronal JINNY Hailstone, MD,as directed by  Ronal JINNY Hailstone, MD while in the presence of Ronal JINNY Hailstone, MD.   I, Ronal JINNY Hailstone, MD, have reviewed all documentation for this visit. The documentation on 11/28/23 for the exam, diagnosis, procedures, and orders are all accurate and complete.

## 2023-11-19 ENCOUNTER — Ambulatory Visit: Admitting: Internal Medicine

## 2023-11-20 ENCOUNTER — Ambulatory Visit: Payer: Self-pay | Admitting: Internal Medicine

## 2023-11-22 ENCOUNTER — Other Ambulatory Visit

## 2023-11-22 ENCOUNTER — Other Ambulatory Visit: Payer: Self-pay

## 2023-11-22 DIAGNOSIS — R718 Other abnormality of red blood cells: Secondary | ICD-10-CM

## 2023-11-22 LAB — VITAMIN B12: Vitamin B-12: 544 pg/mL (ref 200–1100)

## 2023-11-22 LAB — FOLATE: Folate: >24 ng/mL

## 2023-11-23 ENCOUNTER — Ambulatory Visit: Payer: Self-pay | Admitting: Internal Medicine

## 2023-11-28 DIAGNOSIS — K625 Hemorrhage of anus and rectum: Secondary | ICD-10-CM | POA: Insufficient documentation

## 2023-11-28 MED ORDER — CLONAZEPAM 0.1 MG/ML ORAL SUSPENSION: 0.5000 mg | Freq: Two times a day (BID) | ORAL | 1 refills | Status: AC | PRN

## 2023-11-28 NOTE — Patient Instructions (Addendum)
 Continue Klonopin   liquid preparation for burning mouth syndrome. New Rx  written per patient request to take to [facility]. Use Proctofoam HC for internal hemorrhoids twice daily. Continue Humira and Methotrexate per Dr. Mai. Continue Prolia  for osteopenia.

## 2023-12-01 DIAGNOSIS — M0579 Rheumatoid arthritis with rheumatoid factor of multiple sites without organ or systems involvement: Secondary | ICD-10-CM | POA: Diagnosis not present

## 2023-12-02 DIAGNOSIS — C44329 Squamous cell carcinoma of skin of other parts of face: Secondary | ICD-10-CM | POA: Diagnosis not present

## 2023-12-13 DIAGNOSIS — U071 COVID-19: Secondary | ICD-10-CM | POA: Diagnosis not present

## 2023-12-23 ENCOUNTER — Encounter: Payer: Self-pay | Admitting: Internal Medicine

## 2024-01-04 DIAGNOSIS — M792 Neuralgia and neuritis, unspecified: Secondary | ICD-10-CM | POA: Diagnosis not present

## 2024-01-04 DIAGNOSIS — K648 Other hemorrhoids: Secondary | ICD-10-CM | POA: Diagnosis not present

## 2024-01-11 ENCOUNTER — Ambulatory Visit: Admitting: Internal Medicine

## 2024-01-11 DIAGNOSIS — K625 Hemorrhage of anus and rectum: Secondary | ICD-10-CM | POA: Diagnosis not present

## 2024-01-11 DIAGNOSIS — K6289 Other specified diseases of anus and rectum: Secondary | ICD-10-CM

## 2024-01-11 DIAGNOSIS — L290 Pruritus ani: Secondary | ICD-10-CM | POA: Diagnosis not present

## 2024-01-11 DIAGNOSIS — K602 Anal fissure, unspecified: Secondary | ICD-10-CM

## 2024-01-11 MED ORDER — NA SULFATE-K SULFATE-MG SULF 17.5-3.13-1.6 GM/177ML PO SOLN: 1.0000 | ORAL | 0 refills | Status: DC

## 2024-01-11 MED ORDER — NON FORMULARY: 1 refills | Status: AC

## 2024-01-11 NOTE — Progress Notes (Addendum)
 Chief Complaint: Rectal pain, rectal bleeding  HPI : 68 year old female with history of osteoporosis and rheumatoid arthritis cetraxate presents with rectal pain and rectal bleeding  Patient presents with rectal bleeding, pain, and itching that started a couple months ago.  She may have strained before the onset of the symptoms.  Her PCP performed a rectal exam in 11/2023 that just showed internal hemorrhoids but was limited due to patient discomfort.  She was given Proctofoam to take for 2 weeks but did not find any benefit from this medication.  While waiting to be seen by GI, she was able to see her dermatologist who prescribed her amitriptyline.  She has been taking her amitriptyline for the last 1 to 2 weeks but has not noticed a significant difference on this medication.  She on average has 1 bowel movement per day.  She does feel like she passes of a substantial amount of stool with each BM, though she does have some chronic constipation issues, which get exacerbated when she travels.  She has been noticing bleeding with every bowel movement.  Also has been having some stool leakage on occasion.  Denies family history of GI issues. Denies N&V, fevers, dysphagia, ab pain. Her last colonoscopy was over 3 years ago with some non-bleeding internal hemorrhoids  Wt Readings from Last 3 Encounters:  01/11/24 132 lb (59.9 kg)  11/18/23 133 lb (60.3 kg)  10/05/23 134 lb 6.4 oz (61 kg)   Past Medical History:  Diagnosis Date   Arthritis    --rheumatoid   Colon polyp    Osteoporosis 04/05/14   T score -2.6 spine   Salpingitis    Had appendectomy at same time as salpingitis dx was made.   Past Surgical History:  Procedure Laterality Date   APPENDECTOMY     BREAST EXCISIONAL BIOPSY Left    COLONOSCOPY     left breast biopsy     POLYPECTOMY     TONSILLECTOMY AND ADENOIDECTOMY  2014   Family History  Problem Relation Age of Onset   Thyroid disease Mother        thyroidectomy in her  20's--unsure of reason   Lung cancer Mother 57   Heart disease Father    Hypertension Father    Hyperlipidemia Father    Bladder Cancer Father 34   Breast cancer Maternal Aunt 51 - 79   Stroke Paternal Grandfather    Colon cancer Neg Hx    Pancreatic cancer Neg Hx    Esophageal cancer Neg Hx    Rectal cancer Neg Hx    Stomach cancer Neg Hx    Social History   Tobacco Use   Smoking status: Former    Current packs/day: 0.00    Average packs/day: 1 pack/day for 20.0 years (20.0 ttl pk-yrs)    Types: Cigarettes    Start date: 05/04/1970    Quit date: 05/04/1990    Years since quitting: 33.7   Smokeless tobacco: Never  Vaping Use   Vaping status: Never Used  Substance Use Topics   Alcohol use: No    Alcohol/week: 0.0 standard drinks of alcohol   Drug use: No   Current Outpatient Medications  Medication Sig Dispense Refill   Adalimumab (HUMIRA PEN) 40 MG/0.4ML PNKT      Ascorbic Acid (VITAMIN C) 1000 MG tablet Take 1,000 mg by mouth daily.     calcium carbonate (OS-CAL) 600 MG TABS Take 600 mg by mouth daily.     cholecalciferol (VITAMIN  D) 1000 UNITS tablet Take by mouth daily.     clonazePAM  (KLONOPIN ) 0.1 mg/mL SUSP Take 5 mLs (0.5 mg total) by mouth 2 (two) times daily as needed. 300 mL 1   denosumab  (PROLIA ) 60 MG/ML SOSY injection Inject 60 mg into the skin every 6 (six) months.     folic acid  (FOLVITE ) 1 MG tablet Take 2 mg by mouth daily.     hydrocortisone -pramoxine (PROCTOFOAM-HC) rectal foam Place 1 applicator rectally 2 (two) times daily. 10 g 1   meloxicam (MOBIC) 15 MG tablet Take 1 tablet by mouth as needed.     methotrexate (RHEUMATREX) 2.5 MG tablet Take 15 mg by mouth once a week. Caution:Chemotherapy. Protect from light.  PRN     No current facility-administered medications for this visit.   No Known Allergies  Review of Systems: All systems reviewed and negative except where noted in HPI.   Physical Exam: BP 110/60   Pulse 68   Ht 5' 2 (1.575 m)    Wt 132 lb (59.9 kg)   LMP 05/04/2002 (Approximate)   SpO2 98%   BMI 24.14 kg/m  Constitutional: Pleasant,well-developed, female in no acute distress. HEENT: Normocephalic and atraumatic. Conjunctivae are normal. No scleral icterus. Cardiovascular: Normal rate Pulmonary/chest: Effort normal Abdominal: Soft, nondistended, non-tender Rectal: Palpation on the inside of the anus revealed what felt like a posterior anal fissure and significant surrounding induration in that area. Anoscopy revealed friable indurated mucosa, internal hemorrhoids, and was limited by patient discomfort. Extremities: No edema Neurological: Alert and oriented to person place and time. Skin: Skin is warm and dry. No rashes noted. Psychiatric: Normal mood and affect. Behavior is normal.  Labs 09/2023: CMP nml.   Labs 11/2023: CBC unremarkable. Folate and Vit B12 nml.   Colonoscopy 05/13/20: - Non- bleeding internal hemorrhoids. - Single, non- bleeding AVM in the cecum. - The examination was otherwise normal on direct and retroflexion views.  ASSESSMENT AND PLAN: Rectal bleeding Rectal pain Rectal itching Anal fissure Hemorrhoids Patient presents with rectal bleeding, pain, and itching for the last several months.  She did not respond to a course of topical steroids for presumed hemorrhoids has not had a significant response to the amitriptyline as well.  On rectal exam today I did palpate what felt like a posterior anal fissure with significant surrounding induration and friability visualized on anoscopy.  Based upon this exam, I do think it is reasonable to rule out malignancy by performing an urgent colonoscopy. In the interim I we will send the patient some topical treatment for anal fissure. - Diltiazem 2% with Lidocaine 5% - Using your index finger, apply a small amount of medication inside the rectum up to your first knuckle/joint three times daily x 8 weeks. - Colonoscopy [facility] with Dr. Nandigam on 9/16 (patient  preferred a female). Will have her do Miralax every day starting a week before her prep. Okay to stop amitriptyline until her colonoscopy procedure since this can worsen constipation issues  Estefana Kidney, MD  I spent 48 minutes of time, including in depth chart review, independent review of results as outlined above, communicating results with the patient directly, face-to-face time with the patient, coordinating care, ordering studies and medications as appropriate, and documentation.

## 2024-01-11 NOTE — Patient Instructions (Addendum)
 _______________________________________________________  If your blood pressure at your visit was 140/90 or greater, please contact your primary care physician to follow up on this.  _______________________________________________________  If you are age 69 or older, your body mass index should be between 23-30. Your Body mass index is 24.14 kg/m. If this is out of the aforementioned range listed, please consider follow up with your Primary Care Provider.  If you are age 69 or younger, your body mass index should be between 19-25. Your Body mass index is 24.14 kg/m. If this is out of the aformentioned range listed, please consider follow up with your Primary Care Provider.   ________________________________________________________  The [facility] providers would like to encourage you to use MYCHART to communicate with providers for non-urgent requests or questions.  Due to long hold times on the telephone, sending your provider a message by [facility] may be a faster and more efficient way to get a response.  Please allow 48 business hours for a response.  Please remember that this is for non-urgent requests.  _______________________________________________________  [facility] is using a team-based approach to care.  Your team is made up of your doctor and two to three APPS. Our APPS (Nurse Practitioners and Physician Assistants) work with your physician to ensure care continuity for you. They are fully qualified to address your health concerns and develop a treatment plan. They communicate directly with your gastroenterologist to care for you. Seeing the Advanced Practice Practitioners on your physician's team can help you by facilitating care more promptly, often allowing for earlier appointments, access to diagnostic testing, procedures, and other specialty referrals.   .  Your provider has prescribed Diltiazem gel for you. Apply to rectum 3 times daily for 8 weeks. Since this is a  specialty medication and is not readily available at most local pharmacies, we have sent your prescription to:  [facility] information is below: Address: [address] [place], [place] [address]  [phone]  *Please DO NOT go directly from our office to pick up this medication! Give the pharmacy 1 day to process the prescription as this is compounded and takes time to make.  You have been scheduled for a colonoscopy. Please follow written instructions given to you at your visit today.   If you use inhalers (even only as needed), please bring them with you on the day of your procedure.  DO NOT TAKE 7 DAYS PRIOR TO TEST- Trulicity (dulaglutide) Ozempic, Wegovy (semaglutide) Mounjaro (tirzepatide) Bydureon Bcise (exanatide extended release)  DO NOT TAKE 1 DAY PRIOR TO YOUR TEST Rybelsus (semaglutide) Adlyxin (lixisenatide) Victoza (liraglutide) Byetta (exanatide) ___________________________________________________________________________  Due to recent changes in healthcare laws, you may see the results of your imaging and laboratory studies on MyChart before your provider has had a chance to review them.  We understand that in some cases there may be results that are confusing or concerning to you. Not all laboratory results come back in the same time frame and the provider may be waiting for multiple results in order to interpret others.  Please give us  48 hours in order for your provider to thoroughly review all the results before contacting the office for clarification of your results.   Thank you for entrusting me with your care and choosing [facility].  Dr Federico

## 2024-01-13 DIAGNOSIS — L918 Other hypertrophic disorders of the skin: Secondary | ICD-10-CM | POA: Diagnosis not present

## 2024-01-13 DIAGNOSIS — L578 Other skin changes due to chronic exposure to nonionizing radiation: Secondary | ICD-10-CM | POA: Diagnosis not present

## 2024-01-13 DIAGNOSIS — L821 Other seborrheic keratosis: Secondary | ICD-10-CM | POA: Diagnosis not present

## 2024-01-13 DIAGNOSIS — Z85828 Personal history of other malignant neoplasm of skin: Secondary | ICD-10-CM | POA: Diagnosis not present

## 2024-01-13 DIAGNOSIS — L570 Actinic keratosis: Secondary | ICD-10-CM | POA: Diagnosis not present

## 2024-01-13 DIAGNOSIS — D2371 Other benign neoplasm of skin of right lower limb, including hip: Secondary | ICD-10-CM | POA: Diagnosis not present

## 2024-01-13 DIAGNOSIS — D2271 Melanocytic nevi of right lower limb, including hip: Secondary | ICD-10-CM | POA: Diagnosis not present

## 2024-01-13 DIAGNOSIS — D229 Melanocytic nevi, unspecified: Secondary | ICD-10-CM | POA: Diagnosis not present

## 2024-01-13 DIAGNOSIS — Z86018 Personal history of other benign neoplasm: Secondary | ICD-10-CM | POA: Diagnosis not present

## 2024-01-18 ENCOUNTER — Other Ambulatory Visit (INDEPENDENT_AMBULATORY_CARE_PROVIDER_SITE_OTHER)

## 2024-01-18 ENCOUNTER — Other Ambulatory Visit: Payer: Self-pay | Admitting: *Deleted

## 2024-01-18 ENCOUNTER — Encounter: Payer: Self-pay | Admitting: Gastroenterology

## 2024-01-18 ENCOUNTER — Ambulatory Visit (AMBULATORY_SURGERY_CENTER): Admitting: Gastroenterology

## 2024-01-18 VITALS — BP 130/73 | HR 98 | Temp 98.0°F | Resp 12 | Ht 62.0 in | Wt 132.0 lb

## 2024-01-18 DIAGNOSIS — K625 Hemorrhage of anus and rectum: Secondary | ICD-10-CM | POA: Diagnosis not present

## 2024-01-18 DIAGNOSIS — K6289 Other specified diseases of anus and rectum: Secondary | ICD-10-CM | POA: Diagnosis not present

## 2024-01-18 DIAGNOSIS — C20 Malignant neoplasm of rectum: Secondary | ICD-10-CM | POA: Diagnosis not present

## 2024-01-18 DIAGNOSIS — K629 Disease of anus and rectum, unspecified: Secondary | ICD-10-CM

## 2024-01-18 LAB — BUN: BUN: 5 mg/dL — ABNORMAL LOW (ref 6–23)

## 2024-01-18 LAB — CREATININE, SERUM: Creatinine, Ser: 0.59 mg/dL (ref 0.40–1.20)

## 2024-01-18 MED ORDER — SODIUM CHLORIDE 0.9 % IV SOLN: 500.0000 mL | Freq: Once | INTRAVENOUS | Status: DC

## 2024-01-18 NOTE — Progress Notes (Unsigned)
 To pacu, VSS. Report to [email or web address]

## 2024-01-18 NOTE — Progress Notes (Unsigned)
 Called to room to assist during endoscopic procedure.  Patient ID and intended procedure confirmed with present staff. Received instructions for my participation in the procedure from the performing physician.

## 2024-01-18 NOTE — Op Note (Signed)
 [facility] Patient Name: Katrina [name] Procedure Date: 01/18/2024 2:13 PM MRN: [id number] Endoscopist: Gustav ALONSO Mcgee , MD, [id number] Age: 68 Referring MD:  Date of Birth: 1955/07/29 Gender: Female Account #: [id number] Procedure:                Colonoscopy Indications:              Evaluation of unexplained GI bleeding presenting                            with Hematochezia, Rectal mass Medicines:                Monitored Anesthesia Care Procedure:                Pre-Anesthesia Assessment:                           - Prior to the procedure, a History and Physical                            was performed, and patient medications and                            allergies were reviewed. The patient's tolerance of                            previous anesthesia was also reviewed. The risks                            and benefits of the procedure and the sedation                            options and risks were discussed with the patient.                            All questions were answered, and informed consent                            was obtained. Prior Anticoagulants: The patient has                            taken no anticoagulant or antiplatelet agents. ASA                            Grade Assessment: II - A patient with mild systemic                            disease. After reviewing the risks and benefits,                            the patient was deemed in satisfactory condition to                            undergo the procedure.  After obtaining informed consent, the colonoscope                            was passed under direct vision. Throughout the                            procedure, the patient's blood pressure, pulse, and                            oxygen saturations were monitored continuously. The                            [id number]-HQ190L Colonoscope [id number] was introduced                            through the anus and  advanced to the the cecum,                            identified by appendiceal orifice and ileocecal                            valve. The colonoscopy was performed without                            difficulty. The patient tolerated the procedure                            well. The quality of the bowel preparation was                            good. The ileocecal valve, appendiceal orifice, and                            rectum were photographed. Scope In: 2:37:46 PM Scope Out: 2:57:29 PM Scope Withdrawal Time: 0 hours 10 minutes 39 seconds  Total Procedure Duration: 0 hours 19 minutes 43 seconds  Findings:                 The digital rectal exam revealed a 10 cm (diameter)                            hard rectal mass palpated 2 to 5 cm from the anal                            verge. The mass was circumferential.                           An infiltrative and ulcerated partially obstructing                            mass was found in the distal rectum. The mass was                            partially circumferential (involving two-thirds of  the lumen circumference). The mass measured four cm                            in length. Oozing was present. This was biopsied                            with a cold forceps for histology. Proximal                            opposite fold area was tattooed with an injection                            of 2 mL of Spot (carbon black).                           The exam was otherwise normal throughout the                            examined colon. Complications:            No immediate complications. Estimated Blood Loss:     Estimated blood loss was minimal. Impression:               - Rectal mass 2 to 5 cm from the anal verge.                           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 Biopsied. Tattooed. Recommendation:           - Resume previous diet.                            - Continue present medications.                           - Await pathology results.                           - Repeat colonoscopy in 1 year for surveillance                            based on pathology results.                           - Check BUN/Cr                           - Staging CT chest abd & pelvis with contrast Katrina [name] V. Sean Malinowski, MD 01/18/2024 3:10:06 PM This report has been signed electronically.

## 2024-01-18 NOTE — Progress Notes (Unsigned)
 [facility] History and Physical   Primary Care Physician:  [name], MD   Reason for Procedure:  Rectal bleeding, rectal induration, abnormal rectal exam  Plan:    colonoscopy with possible interventions as needed     HPI: Katrina [name] is a very pleasant 68 y.o. female here for colonoscopy for abnormal rectal exam with c/o rectal bleeding.   The risks and benefits as well as alternatives of endoscopic procedure(s) have been discussed and reviewed. All questions answered. The patient agrees to proceed.    Past Medical History:  Diagnosis Date   Arthritis    --rheumatoid   Colon polyp    Osteoporosis 04/05/14   T score -2.6 spine   Salpingitis    Had appendectomy at same time as salpingitis dx was made.    Past Surgical History:  Procedure Laterality Date   APPENDECTOMY     BREAST EXCISIONAL BIOPSY Left    COLONOSCOPY     FINGER FRACTURE SURGERY Right 10/2000   left breast biopsy     POLYPECTOMY     TONSILLECTOMY AND ADENOIDECTOMY  2014    Prior to Admission medications   Medication Sig Start Date End Date Taking? Authorizing Provider  Ascorbic Acid (VITAMIN C) 1000 MG tablet Take 1,000 mg by mouth daily.   Yes [provider]  calcium carbonate (OS-CAL) 600 MG TABS Take 600 mg by mouth daily.   Yes [provider]  cholecalciferol (VITAMIN D ) 1000 UNITS tablet Take by mouth daily.   Yes [provider]  folic acid  (FOLVITE ) 1 MG tablet Take 2 mg by mouth daily. 02/10/21  Yes [provider]  methotrexate (RHEUMATREX) 2.5 MG tablet Take 15 mg by mouth once a week. Caution:Chemotherapy. Protect from light.  PRN   Yes [provider]  NON FORMULARY Diltiazem 2%/Lidocaine5% compound Apply to rectum 3 times daily for 8 weeks 01/11/24  Yes Federico Rosario BROCKS, MD  Adalimumab (HUMIRA PEN) 40 MG/0.4ML PNKT  07/26/17   [provider]  clonazePAM  (KLONOPIN ) 0.1 mg/mL SUSP Take 5 mLs (0.5 mg total) by mouth 2  (two) times daily as needed. 11/28/23   [name], MD  denosumab  (PROLIA ) 60 MG/ML SOSY injection Inject 60 mg into the skin every 6 (six) months.    [provider]  hydrocortisone -pramoxine (PROCTOFOAM-HC) rectal foam Place 1 applicator rectally 2 (two) times daily. 11/18/23   [name], MD  meloxicam (MOBIC) 15 MG tablet Take 1 tablet by mouth as needed.    [provider]    Current Outpatient Medications  Medication Sig Dispense Refill   Ascorbic Acid (VITAMIN C) 1000 MG tablet Take 1,000 mg by mouth daily.     calcium carbonate (OS-CAL) 600 MG TABS Take 600 mg by mouth daily.     cholecalciferol (VITAMIN D ) 1000 UNITS tablet Take by mouth daily.     folic acid  (FOLVITE ) 1 MG tablet Take 2 mg by mouth daily.     methotrexate (RHEUMATREX) 2.5 MG tablet Take 15 mg by mouth once a week. Caution:Chemotherapy. Protect from light.  PRN     NON FORMULARY Diltiazem 2%/Lidocaine5% compound Apply to rectum 3 times daily for 8 weeks 30 g 1   Adalimumab (HUMIRA PEN) 40 MG/0.4ML PNKT      clonazePAM  (KLONOPIN ) 0.1 mg/mL SUSP Take 5 mLs (0.5 mg total) by mouth 2 (two) times daily as needed. 300 mL 1   denosumab  (PROLIA ) 60 MG/ML SOSY injection Inject 60 mg into the  skin every 6 (six) months.     hydrocortisone -pramoxine (PROCTOFOAM-HC) rectal foam Place 1 applicator rectally 2 (two) times daily. 10 g 1   meloxicam (MOBIC) 15 MG tablet Take 1 tablet by mouth as needed.     Current Facility-Administered Medications  Medication Dose Route Frequency Provider Last Rate Last Admin   0.9 %  sodium chloride  infusion  500 mL Intravenous Once Analicia Skibinski V, MD        Allergies as of 01/18/2024   (No Known Allergies)    Family History  Problem Relation Age of Onset   Thyroid disease Mother        thyroidectomy in her 20's--unsure of reason   Lung cancer Mother 60   Heart disease Father    Hypertension Father    Hyperlipidemia Father    Bladder Cancer Father 22    Breast cancer Maternal Aunt 80 - 54   Stroke Paternal Grandfather    Colon cancer Neg Hx    Pancreatic cancer Neg Hx    Esophageal cancer Neg Hx    Rectal cancer Neg Hx    Stomach cancer Neg Hx     Social History   Socioeconomic History   Marital status: Married    Spouse name: Not on file   Number of children: Not on file   Years of education: Not on file   Highest education level: Master's degree (e.g., MA, MS, MEng, MEd, MSW, MBA)  Occupational History   Not on file  Tobacco Use   Smoking status: Former    Current packs/day: 0.00    Average packs/day: 1 pack/day for 20.0 years (20.0 ttl pk-yrs)    Types: Cigarettes    Start date: 05/04/1970    Quit date: 05/04/1990    Years since quitting: 33.7   Smokeless tobacco: Never  Vaping Use   Vaping status: Never Used  Substance and Sexual Activity   Alcohol use: No    Alcohol/week: 0.0 standard drinks of alcohol   Drug use: No   Sexual activity: Not Currently    Partners: Male    Birth control/protection: Post-menopausal  Other Topics Concern   Not on file  Social History Narrative   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  She and her husband has spent considerable time sailing.  They have no children. Exercises with walking, yoga and swimming. Quit smoking ~30 years ago w/ a 20-pack-year history. Does not drink alcohol.   Social Drivers of Corporate investment banker Strain: Low Risk  (11/18/2023)   Overall Financial Resource Strain (CARDIA)    Difficulty of Paying Living Expenses: Not hard at all  Food Insecurity: No Food Insecurity (11/18/2023)   Hunger Vital Sign    Worried About Running Out of Food in the Last Year: Never true    Ran Out of Food in the Last Year: Never true  Transportation Needs: No Transportation Needs (11/18/2023)   PRAPARE - Administrator, Civil Service (Medical): No    Lack of Transportation (Non-Medical): No  Physical Activity: Sufficiently Active  (11/18/2023)   Exercise Vital Sign    Days of Exercise per Week: 6 days    Minutes of Exercise per Session: 60 min  Stress: No Stress Concern Present (11/18/2023)   [employer] of Occupational Health - Occupational Stress Questionnaire    Feeling of Stress: Only a little  Social Connections: Moderately Integrated (11/18/2023)   Social Connection and Isolation Panel  Frequency of Communication with Friends and Family: More than three times a week    Frequency of Social Gatherings with Friends and Family: More than three times a week    Attends Religious Services: Patient declined    Database administrator or Organizations: Yes    Attends Engineer, structural: More than 4 times per year    Marital Status: Married  Catering manager Violence: Not At Risk (05/17/2023)   Humiliation, Afraid, Rape, and Kick questionnaire    Fear of Current or Ex-Partner: No    Emotionally Abused: No    Physically Abused: No    Sexually Abused: No    Review of Systems:  All other review of systems negative except as mentioned in the HPI.  Physical Exam: Vital signs in last 24 hours: BP 115/85   Pulse 87   Temp 98 F (36.7 C) (Temporal)   Ht 5' 2 (1.575 m)   Wt 132 lb (59.9 kg)   LMP 05/04/2002 (Approximate)   SpO2 96%   BMI 24.14 kg/m  General:   Alert, NAD Lungs:  Clear .   Heart:  Regular rate and rhythm Abdomen:  Soft, nontender and nondistended. Neuro/Psych:  Alert and cooperative. Normal mood and affect. A and O x 3  Reviewed labs, radiology imaging, old records and pertinent past GI work up  Patient is appropriate for planned procedure(s) and anesthesia in an ambulatory setting   K. Veena Melina Mosteller , MD [phone]

## 2024-01-18 NOTE — Patient Instructions (Signed)
 Resume previous diet Continue present medications Await pathology results Repeat colonoscopy in 1 year for surveillance based on pathology results Check BUN/Cr Staging CT chest abd & pelvis with contrast My nurse will contact you to schedule   YOU HAD AN ENDOSCOPIC PROCEDURE TODAY AT THE [facility]:   Refer to the procedure report that was given to you for any specific questions about what was found during the examination.  If the procedure report does not answer your questions, please call your gastroenterologist to clarify.  If you requested that your care partner not be given the details of your procedure findings, then the procedure report has been included in a sealed envelope for you to review at your convenience later.  YOU SHOULD EXPECT: Some feelings of bloating in the abdomen. Passage of more gas than usual.  Walking can help get rid of the air that was put into your GI tract during the procedure and reduce the bloating. If you had a lower endoscopy (such as a colonoscopy or flexible sigmoidoscopy) you may notice spotting of blood in your stool or on the toilet paper. If you underwent a bowel prep for your procedure, you may not have a normal bowel movement for a few days.  Please Note:  You might notice some irritation and congestion in your nose or some drainage.  This is from the oxygen used during your procedure.  There is no need for concern and it should clear up in a day or so.  SYMPTOMS TO REPORT IMMEDIATELY:  Following lower endoscopy (colonoscopy or flexible sigmoidoscopy):  Excessive amounts of blood in the stool  Significant tenderness or worsening of abdominal pains  Swelling of the abdomen that is new, acute  Fever of 100F or higher   For urgent or emergent issues, a gastroenterologist can be reached at any hour by calling [phone]. Do not use MyChart messaging for urgent concerns.    DIET:  We do recommend a small meal at first, but then you  may proceed to your regular diet.  Drink plenty of fluids but you should avoid alcoholic beverages for 24 hours.  ACTIVITY:  You should plan to take it easy for the rest of today and you should NOT DRIVE or use heavy machinery until tomorrow (because of the sedation medicines used during the test).    FOLLOW UP: Our staff will call the number listed on your records the next business day following your procedure.  We will call around 7:15- 8:00 am to check on you and address any questions or concerns that you may have regarding the information given to you following your procedure. If we do not reach you, we will leave a message.     If any biopsies were taken you will be contacted by phone or by letter within the next 1-3 weeks.  Please call us  at [phone] if you have not heard about the biopsies in 3 weeks.    SIGNATURES/CONFIDENTIALITY: You and/or your care partner have signed paperwork which will be entered into your electronic medical record.  These signatures attest to the fact that that the information above on your After Visit Summary has been reviewed and is understood.  Full responsibility of the confidentiality of this discharge information lies with you and/or your care-partner.

## 2024-01-19 ENCOUNTER — Other Ambulatory Visit: Payer: Self-pay

## 2024-01-19 ENCOUNTER — Encounter: Payer: Self-pay | Admitting: Gastroenterology

## 2024-01-19 ENCOUNTER — Telehealth: Payer: Self-pay

## 2024-01-19 ENCOUNTER — Ambulatory Visit (HOSPITAL_BASED_OUTPATIENT_CLINIC_OR_DEPARTMENT_OTHER)
Admission: RE | Admit: 2024-01-19 | Discharge: 2024-01-19 | Disposition: A | Discharge status: Home / Self Care | Source: Ambulatory Visit | Attending: Gastroenterology | Admitting: Gastroenterology

## 2024-01-19 ENCOUNTER — Other Ambulatory Visit: Payer: Self-pay | Admitting: Gastroenterology

## 2024-01-19 DIAGNOSIS — D259 Leiomyoma of uterus, unspecified: Secondary | ICD-10-CM | POA: Diagnosis not present

## 2024-01-19 DIAGNOSIS — K6289 Other specified diseases of anus and rectum: Secondary | ICD-10-CM

## 2024-01-19 DIAGNOSIS — R935 Abnormal findings on diagnostic imaging of other abdominal regions, including retroperitoneum: Secondary | ICD-10-CM | POA: Diagnosis not present

## 2024-01-19 LAB — SURGICAL PATHOLOGY

## 2024-01-19 MED ORDER — TRAMADOL HCL 50 MG PO TABS: 50.0000 mg | ORAL_TABLET | Freq: Four times a day (QID) | ORAL | 0 refills | Status: DC | PRN

## 2024-01-19 MED ORDER — IOHEXOL 300 MG/ML  SOLN
75.0000 mL | Freq: Once | INTRAMUSCULAR | Status: AC | PRN
  Administered 2024-01-19: 75 mL via INTRAVENOUS

## 2024-01-19 NOTE — Telephone Encounter (Signed)
 No answer on follow-up call. Left VM for pt.

## 2024-01-20 ENCOUNTER — Other Ambulatory Visit: Payer: Self-pay

## 2024-01-20 ENCOUNTER — Ambulatory Visit: Payer: Self-pay | Admitting: Gastroenterology

## 2024-01-20 DIAGNOSIS — C211 Malignant neoplasm of anal canal: Secondary | ICD-10-CM

## 2024-01-20 DIAGNOSIS — K6289 Other specified diseases of anus and rectum: Secondary | ICD-10-CM

## 2024-01-27 ENCOUNTER — Inpatient Hospital Stay: Admitting: Oncology

## 2024-01-27 ENCOUNTER — Inpatient Hospital Stay: Attending: Oncology

## 2024-01-27 ENCOUNTER — Encounter: Payer: Self-pay | Admitting: Oncology

## 2024-01-27 VITALS — BP 135/84 | HR 85 | Temp 98.8°F | Resp 17 | Ht 62.0 in | Wt 126.6 lb

## 2024-01-27 DIAGNOSIS — C218 Malignant neoplasm of overlapping sites of rectum, anus and anal canal: Secondary | ICD-10-CM | POA: Insufficient documentation

## 2024-01-27 DIAGNOSIS — G4700 Insomnia, unspecified: Secondary | ICD-10-CM | POA: Diagnosis not present

## 2024-01-27 DIAGNOSIS — C44520 Squamous cell carcinoma of anal skin: Secondary | ICD-10-CM

## 2024-01-27 DIAGNOSIS — M069 Rheumatoid arthritis, unspecified: Secondary | ICD-10-CM | POA: Insufficient documentation

## 2024-01-27 DIAGNOSIS — C211 Malignant neoplasm of anal canal: Secondary | ICD-10-CM

## 2024-01-27 DIAGNOSIS — F5109 Other insomnia not due to a substance or known physiological condition: Secondary | ICD-10-CM | POA: Insufficient documentation

## 2024-01-27 DIAGNOSIS — K146 Glossodynia: Secondary | ICD-10-CM | POA: Insufficient documentation

## 2024-01-27 DIAGNOSIS — M0579 Rheumatoid arthritis with rheumatoid factor of multiple sites without organ or systems involvement: Secondary | ICD-10-CM

## 2024-01-27 LAB — CMP (CANCER CENTER ONLY)
ALT: 11 U/L (ref 0–44)
AST: 19 U/L (ref 15–41)
Albumin: 4.3 g/dL (ref 3.5–5.0)
Alkaline Phosphatase: 58 U/L (ref 38–126)
Anion gap: 4 — ABNORMAL LOW (ref 5–15)
BUN: 10 mg/dL (ref 8–23)
CO2: 31 mmol/L (ref 22–32)
Calcium: 9.9 mg/dL (ref 8.9–10.3)
Chloride: 101 mmol/L (ref 98–111)
Creatinine: 0.66 mg/dL (ref 0.44–1.00)
GFR, Estimated: >60 mL/min (ref >60)
Glucose, Bld: 108 mg/dL — ABNORMAL HIGH (ref 70–99)
Potassium: 3.7 mmol/L (ref 3.5–5.1)
Sodium: 136 mmol/L (ref 135–145)
Total Bilirubin: 0.4 mg/dL (ref 0.0–1.2)
Total Protein: 7.8 g/dL (ref 6.5–8.1)

## 2024-01-27 LAB — CEA (ACCESS): CEA (CHCC): 1.4 ng/mL (ref 0.00–5.00)

## 2024-01-27 LAB — CBC WITH DIFFERENTIAL (CANCER CENTER ONLY)
Abs Immature Granulocytes: 0.04 K/uL (ref 0.00–0.07)
Basophils Absolute: 0.1 K/uL (ref 0.0–0.1)
Basophils Relative: 1 %
Eosinophils Absolute: 0.1 K/uL (ref 0.0–0.5)
Eosinophils Relative: 1 %
HCT: 43.9 % (ref 36.0–46.0)
Hemoglobin: 14.7 g/dL (ref 12.0–15.0)
Immature Granulocytes: 1 %
Lymphocytes Relative: 19 %
Lymphs Abs: 1.7 K/uL (ref 0.7–4.0)
MCH: 32.1 pg (ref 26.0–34.0)
MCHC: 33.5 g/dL (ref 30.0–36.0)
MCV: 95.9 fL (ref 80.0–100.0)
Monocytes Absolute: 1.1 K/uL — ABNORMAL HIGH (ref 0.1–1.0)
Monocytes Relative: 13 %
Neutro Abs: 5.8 K/uL (ref 1.7–7.7)
Neutrophils Relative %: 65 %
Platelet Count: 305 K/uL (ref 150–400)
RBC: 4.58 MIL/uL (ref 3.87–5.11)
RDW: 12.7 % (ref 11.5–15.5)
WBC Count: 8.8 K/uL (ref 4.0–10.5)
nRBC: 0 % (ref 0.0–0.2)

## 2024-01-27 LAB — IRON AND IRON BINDING CAPACITY (CC-WL,HP ONLY)
Iron: 68 ug/dL (ref 28–170)
Saturation Ratios: 19 % (ref 10.4–31.8)
TIBC: 356 ug/dL (ref 250–450)
UIBC: 288 ug/dL (ref 148–442)

## 2024-01-27 LAB — HIV ANTIBODY (ROUTINE TESTING W REFLEX): HIV Screen 4th Generation wRfx: NONREACTIVE

## 2024-01-27 LAB — FERRITIN: Ferritin: 221 ng/mL (ref 11–307)

## 2024-01-27 MED ORDER — ONDANSETRON HCL 8 MG PO TABS: 8.0000 mg | ORAL_TABLET | Freq: Three times a day (TID) | ORAL | 1 refills | Status: DC | PRN

## 2024-01-27 MED ORDER — PROCHLORPERAZINE MALEATE 10 MG PO TABS: 10.0000 mg | ORAL_TABLET | Freq: Four times a day (QID) | ORAL | 1 refills | Status: DC | PRN

## 2024-01-27 NOTE — Assessment & Plan Note (Addendum)
 Please review oncology history for additional details and timeline of events.  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 Biopsies confirm squamous cell carcinoma.   CT abdomen and pelvis on 01/19/2024 showed irregularity of the rectal wall with an ill-defined 1.5 x 2 cm hypodense lesion, in keeping with known rectal mass. 9 mm lymph node in the posterior pelvis adjacent to the rectosigmoid, concerning for local lymph node involvement.  No evidence of distant metastasis in the abdomen or pelvis.  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  At least T2, N1, Mx disease.  We will proceed with staging PET scan.  Referral sent to Dr.Moody for radiation oncology evaluation.  Provided no other evidence of metastatic disease is noted on PET scan, we will proceed with concurrent chemoradiation using 5-FU/mitomycin using Nigro protocol.  Chemotherapy will be given via PICC line on days 1-4 and days 29-32. We will monitor for treatment side effects such as nausea, diarrhea, low blood counts, fatigue, altered taste, and hair thinning, and manage accordingly.  -We will schedule chemotherapy education session within the next week.  I will plan to see her for follow-up depending on when she is scheduled to begin radiation treatments.

## 2024-01-27 NOTE — Progress Notes (Signed)
 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   Primary squamous cell carcinoma of anal canal Please review oncology history for additional details and timeline of events.  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 Biopsies confirm squamous cell carcinoma.   CT abdomen and pelvis on 01/19/2024 showed irregularity of the rectal wall with an ill-defined 1.5 x 2 cm hypodense lesion, in keeping with known rectal mass. 9 mm lymph node in the posterior pelvis adjacent to the rectosigmoid, concerning for local lymph node involvement.  No evidence of distant metastasis in the abdomen or pelvis.  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  At least T2, N1, Mx disease.  We will proceed with staging PET scan.  Referral sent to Dr.Moody for radiation oncology evaluation.  Provided no other evidence of metastatic disease is noted on PET scan, we will proceed with  concurrent chemoradiation using 5-FU/mitomycin using Nigro protocol.  Chemotherapy will be given via PICC line on days 1-4 and days 29-32. We will monitor for treatment side effects such as nausea, diarrhea, low blood counts, fatigue, altered taste, and hair thinning, and manage accordingly.  -We will schedule chemotherapy education session within the next week.  I will plan to see her for follow-up depending on when she is scheduled to begin radiation treatments.  Rheumatoid arthritis (HCC) Rheumatoid arthritis is well-managed with methotrexate and Humira. Chemotherapy may have a beneficial effect on RA symptoms. - Continue methotrexate until chemotherapy initiation. - Pause Humira and methotrexate during chemotherapy treatment.  Situational insomnia Insomnia is a significant issue, exacerbated by current medical conditions. Current management includes Unisom - Consider adding melatonin for sleep support.  Burning mouth syndrome Burning mouth syndrome symptoms have lessened since the onset of anal pain. No definitive diagnosis has been made, and the condition is managed symptomatically. - Continue symptomatic management with Klonopin  swish as needed.   I reviewed lab results and outside records for this visit and discussed relevant results with the patient. Diagnosis, plan of care and treatment options were also discussed in detail with the patient. Opportunity provided to ask questions and answers provided to her apparent satisfaction. Provided instructions to call our clinic with any problems, questions or concerns prior to return visit. I recommended to continue follow-up with PCP and sub-specialists. She verbalized understanding and agreed with the plan. No barriers to learning was detected.  NCCN guidelines have been consulted in the planning of this patient's care.  Millman Autumn, MD  01/27/2024 12:18 PM  [facility]  [facility] - A DEPT OF [facility] [address] [place] [address] Dept: [phone] Dept Fax: [phone]   HISTORY OF PRESENTING ILLNESS:   I have reviewed her chart and materials related to her cancer extensively and collaborated history with the patient. Summary of oncologic history is as follows:  ONCOLOGY HISTORY:  Patient presented to her PCP on 11/18/2023 with complaints of rectal itching for couple of weeks and a sensation of incomplete bowel movement, associated with occasional bleeding during/after a bowel movement.  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  Otherwise unremarkable colonoscopy.  On rectal exam in the clinic, similar findings were noted with nonbleeding internal hemorrhoids with additional findings of perirectal erythema and internal erythema but no bright red blood was visualized.  Prescription sent for Proctofoam-HC twice daily.  Apparently she did not get benefit from this medication.  She was referred to GI for further evaluation.  She had consultation with Dr. Federico on 01/11/2024.  She reported that she has been noticing bleeding with every bowel movement.  Also reported some stool leakage on occasion.  On physical exam in the clinic, palpation of the inside of the anus revealed what felt like a posterior anal fissure and significant surrounding induration in that area.  Anoscopy revealed friable indurated mucosa, internal hemorrhoids and was limited by patient discomfort.  Recommendation made for urgent colonoscopy.  On 01/18/2024, colonoscopy was performed by Dr. Shila. An infiltrative and ulcerated partially obstructing mass was found in the distal rectum.  The mass was partially circumferential (involving two thirds of the lumen circumference).  The mass measured 4 cm in length.  Oozing was present.  This was biopsied with a cold forceps for histology.  Exam  was otherwise normal throughout the examined colon.  Pathology from the distal rectal lesion came back positive for invasive moderately differentiated squamous cell carcinoma.  Immunostains were not obtained.  CT abdomen and pelvis on 01/19/2024 showed irregularity of the rectal wall with an ill-defined 1.5 x 2 cm hypodense lesion, in keeping with known rectal mass. 9 mm lymph node in the posterior pelvis adjacent to the rectosigmoid, concerning for local lymph node involvement.  No evidence of distant metastasis in the abdomen or pelvis.  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  At least T2, N1, Mx disease.  We will proceed with staging PET scan.  Referral sent for radiation oncology evaluation.  Provided no other evidence of metastatic disease is noted on PET scan, we will proceed with concurrent chemoradiation using 5-FU/mitomycin using Nigro protocol.   Oncology History  Primary squamous cell carcinoma of anal canal  01/27/2024 Initial Diagnosis   Primary squamous cell carcinoma of anal canal   01/27/2024 Cancer Staging   Staging form: Anus, AJCC V9 - Clinical: Stage IIB (cT2, cN1a, cM0) - Signed by Autumn Millman, MD on 01/27/2024 Method of lymph node assessment: Clinical   02/07/2024 -  Chemotherapy   Patient is on Treatment Plan : ANUS Mitomycin D1,28 + 5FU D1-4, 28-31 q32d       INTERVAL HISTORY:  Discussed the use of AI scribe software for clinical note transcription with the patient, who gave verbal consent to proceed.  History of Present Illness Katrina [name] is a 68 year old female with a history of internal hemorrhoids and rheumatoid  arthritis who presents with anal pain and bleeding.   She has been experiencing anal pain and bleeding since July 2025 , initially described as burning, stinging, and intermittent bleeding. Despite using Protofoam, she found minimal relief. She recalls  that during a rectal exam performed by her primary care provider, she was told there was blood present but no clear source of bleeding was identified. Her history of internal hemorrhoids, diagnosed during a colonoscopy in January 2022, was initially considered the cause of her symptoms.  In August, she found significant relief from pain with Mobic, which she uses occasionally for her rheumatoid arthritis. However, the pain returned, and she tested positive for COVID-19 in early August, treated with Paxlovid.   Despite treatments, her anal pain persisted, prompting another consultation in late August. She reports that her dermatologist did not see anything abnormal externally during the evaluation.  She was eventually seen by Dr. Federico for GI evaluation on 01/11/2024 and underwent colonoscopy by Dr.Nandigam on September 16th.  For pain management, she uses Mobic sparingly and has tried tramadol , which alleviates pain but affects her sleep. She also applies diltiazem cream three times a day for relief. Sleep disturbances are significant, and she occasionally uses Unisom and Zyrtec to aid sleep.  Her rheumatoid arthritis is managed with methotrexate and Humira, which she has paused due to her current condition. She also has a history of burning mouth syndrome, which has lessened since the onset of her anal pain.  Her social history includes a supportive husband, Margrette, and she is actively involved in managing her health conditions, including regular communication with her healthcare providers.    MEDICAL HISTORY:  Past Medical History:  Diagnosis Date   Arthritis    --rheumatoid   Colon polyp    Osteoporosis 04/05/14   T score -2.6 spine   Salpingitis    Had appendectomy at same time as salpingitis dx was made.    SURGICAL HISTORY: Past Surgical History:  Procedure Laterality Date   APPENDECTOMY     BREAST EXCISIONAL BIOPSY Left    COLONOSCOPY     FINGER FRACTURE SURGERY Right 10/2000    left breast biopsy     POLYPECTOMY     TONSILLECTOMY AND ADENOIDECTOMY  2014    SOCIAL HISTORY: She reports that she quit smoking about 33 years ago. Her smoking use included cigarettes. She started smoking about 53 years ago. She has a 20 pack-year smoking history. She has never used smokeless tobacco. She reports that she does not drink alcohol and does not use drugs. Social History   Socioeconomic History   Marital status: Married    Spouse name: Not on file   Number of children: Not on file   Years of education: Not on file   Highest education level: Master's degree (e.g., MA, MS, MEng, MEd, MSW, MBA)  Occupational History   Not on file  Tobacco Use   Smoking status: Former    Current packs/day: 0.00    Average packs/day: 1 pack/day for 20.0 years (20.0 ttl pk-yrs)    Types: Cigarettes    Start date: 05/04/1970    Quit date: 05/04/1990    Years since quitting: 33.7   Smokeless tobacco: Never  Vaping Use   Vaping status: Never Used  Substance and Sexual Activity   Alcohol use: No    Alcohol/week: 0.0 standard drinks of alcohol   Drug use: No   Sexual activity: Not Currently    Partners: Male    Birth control/protection: Post-menopausal  Other Topics Concern   Not on file  Social History Narrative   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  She and her husband has spent considerable time sailing.  They have no children. Exercises with walking, yoga and swimming. Quit smoking ~30 years ago w/ a 20-pack-year history. Does not drink alcohol.   Social Drivers of Corporate investment banker Strain: Low Risk  (11/18/2023)   Overall Financial Resource Strain (CARDIA)    Difficulty of Paying Living Expenses: Not hard at all  Food Insecurity: No Food Insecurity (11/18/2023)   Hunger Vital Sign    Worried About Running Out of Food in the Last Year: Never true    Ran Out of Food in the Last Year: Never true  Transportation Needs: No Transportation Needs  (11/18/2023)   PRAPARE - Administrator, Civil Service (Medical): No    Lack of Transportation (Non-Medical): No  Physical Activity: Sufficiently Active (11/18/2023)   Exercise Vital Sign    Days of Exercise per Week: 6 days    Minutes of Exercise per Session: 60 min  Stress: No Stress Concern Present (11/18/2023)   [employer] of Occupational Health - Occupational Stress Questionnaire    Feeling of Stress: Only a little  Social Connections: Moderately Integrated (11/18/2023)   Social Connection and Isolation Panel    Frequency of Communication with Friends and Family: More than three times a week    Frequency of Social Gatherings with Friends and Family: More than three times a week    Attends Religious Services: Patient declined    Database administrator or Organizations: Yes    Attends Engineer, structural: More than 4 times per year    Marital Status: Married  Catering manager Violence: Not At Risk (05/17/2023)   Humiliation, Afraid, Rape, and Kick questionnaire    Fear of Current or Ex-Partner: No    Emotionally Abused: No    Physically Abused: No    Sexually Abused: No    FAMILY HISTORY: Family History  Problem Relation Age of Onset   Thyroid disease Mother        thyroidectomy in her 20's--unsure of reason   Lung cancer Mother 2   Heart disease Father    Hypertension Father    Hyperlipidemia Father    Bladder Cancer Father 46   Breast cancer Maternal Aunt 108 - 79   Stroke Paternal Grandfather    Colon cancer Neg Hx    Pancreatic cancer Neg Hx    Esophageal cancer Neg Hx    Rectal cancer Neg Hx    Stomach cancer Neg Hx     ALLERGIES:  She has no known allergies.  MEDICATIONS:  Current Outpatient Medications  Medication Sig Dispense Refill   Adalimumab (HUMIRA PEN) 40 MG/0.4ML PNKT      Ascorbic Acid (VITAMIN C) 1000 MG tablet Take 1,000 mg by mouth daily.     calcium carbonate (OS-CAL) 600 MG TABS Take 600 mg by mouth daily.      cholecalciferol (VITAMIN D ) 1000 UNITS tablet Take by mouth daily.     clonazePAM  (KLONOPIN ) 0.1 mg/mL SUSP Take 5 mLs (0.5 mg total) by mouth 2 (two) times daily as needed. 300 mL 1   denosumab  (PROLIA ) 60 MG/ML SOSY injection Inject 60 mg into the skin every 6 (six) months.     folic acid  (FOLVITE ) 1 MG tablet Take 2 mg by mouth daily.  meloxicam (MOBIC) 15 MG tablet Take 1 tablet by mouth as needed.     methotrexate (RHEUMATREX) 2.5 MG tablet Take 15 mg by mouth once a week. Caution:Chemotherapy. Protect from light.  PRN     NON FORMULARY Diltiazem 2%/Lidocaine5% compound Apply to rectum 3 times daily for 8 weeks 30 g 1   traMADol  (ULTRAM ) 50 MG tablet Take 1 tablet (50 mg total) by mouth every 6 (six) hours as needed. 30 tablet 0   ondansetron  (ZOFRAN ) 8 MG tablet Take 1 tablet (8 mg total) by mouth every 8 (eight) hours as needed for nausea or vomiting. 30 tablet 1   prochlorperazine  (COMPAZINE ) 10 MG tablet Take 1 tablet (10 mg total) by mouth every 6 (six) hours as needed for nausea or vomiting. 30 tablet 1   No current facility-administered medications for this visit.    REVIEW OF SYSTEMS:    Review of Systems - Oncology  All other pertinent systems were reviewed with the patient and are negative.  PHYSICAL EXAMINATION:   Onc Performance Status - 01/27/24 1116       ECOG Perf Status   ECOG Perf Status Fully active, able to carry on all pre-disease performance without restriction      KPS SCALE   KPS % SCORE Normal, no compliants, no evidence of disease          Vitals:   01/27/24 1114  BP: 135/84  Pulse: 85  Resp: 17  Temp: 98.8 F (37.1 C)  SpO2: 100%   Filed Weights   01/27/24 1114  Weight: 126 lb 9.6 oz (57.4 kg)    Physical Exam Constitutional:      General: She is not in acute distress.    Appearance: Normal appearance.  HENT:     Head: Normocephalic and atraumatic.  Cardiovascular:     Rate and Rhythm: Normal rate.     Heart sounds: Normal  heart sounds.  Pulmonary:     Effort: Pulmonary effort is normal. No respiratory distress.     Breath sounds: Normal breath sounds.  Abdominal:     General: There is no distension.  Musculoskeletal:     Right lower leg: No edema.     Left lower leg: No edema.  Lymphadenopathy:     Cervical: Cervical adenopathy (left sided, ~2-3 cm, non-fixed, non-tender) present.  Neurological:     General: No focal deficit present.     Mental Status: She is alert and oriented to person, place, and time.  Psychiatric:        Mood and Affect: Mood normal.        Behavior: Behavior normal.      LABORATORY DATA:   I have reviewed the data as listed. Labs pending from today.   No results found for any visits on 01/27/24.   RADIOGRAPHIC STUDIES:  I have personally reviewed the radiological images as listed and agree with the findings in the report.  CT ABDOMEN PELVIS W CONTRAST Result Date: 01/19/2024 CLINICAL DATA:  Rectal mass.  Rule out metastasis. EXAM: CT ABDOMEN AND PELVIS WITH CONTRAST TECHNIQUE: Multidetector CT imaging of the abdomen and pelvis was performed using the standard protocol following bolus administration of intravenous contrast. RADIATION DOSE REDUCTION: This exam was performed according to the departmental dose-optimization program which includes automated exposure control, adjustment of the mA and/or kV according to patient size and/or use of iterative reconstruction technique. CONTRAST:  75mL OMNIPAQUE  IOHEXOL  300 MG/ML  SOLN COMPARISON:  None Available. FINDINGS: Lower chest: The  visualized lung bases are clear. No intra-abdominal free air or free fluid. Hepatobiliary: The liver is unremarkable. No biliary ductal dilatation. The gallbladder is unremarkable. Pancreas: Unremarkable. No pancreatic ductal dilatation or surrounding inflammatory changes. Spleen: Normal in size without focal abnormality. Adrenals/Urinary Tract: The adrenal glands unremarkable. There is no hydronephrosis  on either side. There is symmetric enhancement and excretion of contrast by both kidneys. The visualized ureters and urinary bladder appear unremarkable. Stomach/Bowel: Irregularity of the rectal wall with an ill-defined 1.5 x 2.0 cm hypodense lesion in keeping with known rectal mass. There is no bowel obstruction or active inflammation. Appendectomy. Vascular/Lymphatic: The abdominal aorta and IVC unremarkable. No portal venous gas. Top-normal lymph node in the posterior pelvis adjacent to the rectosigmoid measures approximately 9 mm in diameter (65/301 and coronal 44/601). This is concerning for local lymph node involvement. There is no adenopathy. Reproductive: Small calcified uterine fibroid. No suspicious adnexal masses. Other: None Musculoskeletal: No acute osseous pathology. IMPRESSION: 1. Irregularity of the rectal wall with an ill-defined 1.5 x 2.0 cm hypodense lesion in keeping with known rectal mass. 2. Top-normal lymph node in the posterior pelvis adjacent to the rectosigmoid concerning for local lymph node involvement. 3. No evidence of distant metastasis in the abdomen or pelvis. Electronically Signed   By: Vanetta Chou M.D.   On: 01/19/2024 14:55    Orders Placed This Encounter  Procedures   Consent Attestation for Oncology Treatment    The patient is informed of risks, benefits, side-effects of the prescribed oncology treatment. Potential short term and long term side effects and response rates discussed. After a long discussion, the patient made informed decision to proceed.:   Yes   NM PET Image Initial (PI) Skull Base To Thigh    Standing Status:   Future    Expected Date:   01/31/2024    Expiration Date:   01/26/2025    If indicated for the ordered procedure, I authorize the administration of a radiopharmaceutical per Radiology protocol:   Yes    Preferred imaging location?:   [facility]   CMP (Cancer Center only)    Standing Status:   Future    Expiration Date:   01/26/2025    CBC with Differential (Cancer Center Only)    Standing Status:   Future    Expiration Date:   01/26/2025   Iron and Iron Binding Capacity ([facility] only)    Standing Status:   Future    Expiration Date:   01/26/2025   Ferritin    Standing Status:   Future    Expiration Date:   01/26/2025   CEA (Access)    Standing Status:   Future    Expiration Date:   01/26/2025   HIV antibody (with reflex)    Standing Status:   Future    Expiration Date:   01/26/2025   CBC with Differential (Cancer Center Only)    Standing Status:   Future    Expected Date:   02/07/2024    Expiration Date:   02/06/2025   CMP (Cancer Center only)    Standing Status:   Future    Expected Date:   02/07/2024    Expiration Date:   02/06/2025   CBC with Differential (Cancer Center Only)    Standing Status:   Future    Expected Date:   03/05/2024    Expiration Date:   03/05/2025   CMP (Cancer Center only)    Standing Status:   Future    Expected  Date:   03/05/2024    Expiration Date:   03/05/2025    CODE STATUS:   Future Appointments  Date Time Provider Department Center  04/05/2024 11:00 AM [id number] None  06/08/2024  9:10 AM [facility] [address]  06/12/2024  3:00 PM Baxley, Ronal PARAS, MD [facility] [address]     I spent a total of 70 minutes during this encounter with the patient including review of chart and various tests results, discussions about plan of care and coordination of care plan.  This document was completed utilizing speech recognition software. Grammatical errors, random word insertions, pronoun errors, and incomplete sentences are an occasional consequence of this system due to software limitations, ambient noise, and hardware issues. Any formal questions or concerns about the content, text or information contained within the body of this dictation should be directly addressed to the provider for clarification.

## 2024-01-27 NOTE — Assessment & Plan Note (Signed)
 Insomnia is a significant issue, exacerbated by current medical conditions. Current management includes Unisom - Consider adding melatonin for sleep support.

## 2024-01-27 NOTE — Progress Notes (Signed)
START ON PATHWAY REGIMEN - Anal Carcinoma     One cycle, concurrent with RT:     Fluorouracil      Mitomycin   **Always confirm dose/schedule in your pharmacy ordering system**  Patient Characteristics: Anal Canal Tumors, Newly Diagnosed - Locoregional Disease (Clinical Staging) Therapeutic Status: Newly Diagnosed - Locoregional Disease (Clinical Staging) AJCC T Category: cT2 AJCC N Category: cN1a AJCC M Category: cM0 AJCC 9 Stage Grouping: IIB Check here if patient was staged using an edition other than AJCC Staging 9th Edition: false Intent of Therapy: Curative Intent, Discussed with Patient

## 2024-01-27 NOTE — Assessment & Plan Note (Signed)
 Rheumatoid arthritis is well-managed with methotrexate and Humira. Chemotherapy may have a beneficial effect on RA symptoms. - Continue methotrexate until chemotherapy initiation. - Pause Humira and methotrexate during chemotherapy treatment.

## 2024-01-27 NOTE — Progress Notes (Signed)
 PATIENT NAVIGATOR PROGRESS NOTE  Name: Katrina [name] Date: 01/27/2024 MRN: [id number]  DOB: 1955-05-13   Reason for visit:  New Patient Med/Onc Appt  Comments:   Patient was seen during her new patient appointment with Dr. Chinita Patten.  Patient was given Journey's binder with information specific to her diagnosis. Patient was also given my direct contact information and was instructed to contact office with any questions or concerns.    The following referrals were placed:  Physical Therapy Social Work Nutrition   Time spent counseling/coordinating care: > 60 minutes

## 2024-01-27 NOTE — Assessment & Plan Note (Signed)
 Burning mouth syndrome symptoms have lessened since the onset of anal pain. No definitive diagnosis has been made, and the condition is managed symptomatically. - Continue symptomatic management with Klonopin  swish as needed.

## 2024-01-28 ENCOUNTER — Other Ambulatory Visit: Payer: Self-pay | Admitting: Oncology

## 2024-01-28 DIAGNOSIS — C44520 Squamous cell carcinoma of anal skin: Secondary | ICD-10-CM

## 2024-01-28 NOTE — Progress Notes (Signed)
 PATIENT NAVIGATOR PROGRESS NOTE  Name: Katrina [name] Date: 01/28/2024 MRN: [id number]  DOB: [birth year]   Patient called office with follow-up questions after her initial visit on 9/25.  Patient had questions about when to stop her methotrexate and humira.  Informed patient I would consult with Dr. Autumn and let her know his recommendations. Patient verbalized understanding.    Time spent counseling/coordinating care: 45-60 minutes

## 2024-01-28 NOTE — Progress Notes (Signed)
 PATIENT NAVIGATOR PROGRESS NOTE  Name: Katrina [name] Date: 01/28/2024 MRN: [id number]  DOB: Aug 04, 1955    Comments:   Received communication from Rad/Onc regarding potential treatment start date.  Will plan for treatment start date of 10/13. Orders placed for PICC line placement on 10/10. Called and left message with patient giving her the update on potential start date.    Will follow-up to make sure appts have been scheduled.    Time spent counseling/coordinating care: 45-60 minutes

## 2024-01-29 NOTE — Progress Notes (Incomplete)
 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 female seen in consultation for radiation therapy.  She initially presented in July with rectal pain, rectal itching, rectal bleeding and change in stool caliber. She saw her PCP for this. Colonoscopy from 2022 demonstrated internal hemorrhoids and findings from anoscopy performed in office consistent with non bleeding internal hemorrhoids. She was thus prescribed rectal proctofoam. Her sx were persistent and without improvement with the use of proctofoam. In August she reached out to her PCP who placed a referral to GI for further work up and management.   She saw Dr. Federico in GI on 01/11/24 and on exam was noted to have a posterior anal fissure with significant surrounding induration and surrounding friability concerning enough that urgent colonoscopy to rule out malignancy was recommended.   She saw Dr. Nandigam on 09/16 for colonoscopy which revealed an infiltrative, ulcerated and partially obstructing mass, measuring 4 cm in length. Oozing was present. This was biopsied and returned consistent with squamous cell  carcinoma.  CT AP W was obtained and revealed irregularity of the rectal wall with an ill-defined 1.5 x 2.0 cm hypodense lesion in keeping with the known rectal mass. A lymph node in the posterior pelvis adjacent to the rectosigmoid was felt to be concerning for local lymph node involvement, otherwise no evidence of metastatic disease in the abdomen or pelvis.  Today she reports progressive anorectal pain and discomfort, anorectal burning and itching, change in stool caliber and some blood per rectum. Pain improves with meloxicam but the relief is not durable or long lasting. Not currently using tylenol . Does have some tramadol  which she uses occasionally but which she thinks causes her to feel amped up such that her sleep is disturbed.   Accompanied today by her husband who has had SRS for a pituitary tumor.   PREVIOUS RADIATION THERAPY: No  AUTOIMMUNE DISEASE: Yes, Katrina [name] arthritis currently on Humira and methotrexate, planning to stop prior to treatment; last dose of methotrexate last Friday  MEDICAL DEVICES: No  PREGNANCY: No - age   PAST MEDICAL HISTORY:  Past Medical History:  Diagnosis Date   Arthritis    --Katrina [name]   Colon polyp    Osteoporosis 04/05/14   T score -2.6 spine   Salpingitis    Had appendectomy at same time as salpingitis dx was made.       PAST SURGICAL HISTORY: Past Surgical History:  Procedure Laterality Date   APPENDECTOMY     BREAST EXCISIONAL BIOPSY Left    COLONOSCOPY     FINGER FRACTURE SURGERY Right 10/2000  left breast biopsy     POLYPECTOMY     TONSILLECTOMY AND ADENOIDECTOMY  2014     FAMILY HISTORY:  Family History  Problem Relation Age of Onset   Katrina [name] disease Katrina [name]        thyroidectomy in her 20's--unsure of reason   Katrina [name] cancer Katrina [name] 34   Heart disease Katrina [name]    Hypertension Katrina [name]    Hyperlipidemia Katrina [name]    Bladder Cancer Katrina [name] 15   Breast cancer Katrina [name] 89 - 78   Stroke Katrina [name]    Colon  cancer Neg Hx    Pancreatic cancer Neg Hx    Esophageal cancer Neg Hx    Rectal cancer Neg Hx    Stomach cancer Neg Hx      SOCIAL HISTORY:  reports that she quit smoking about 33 years ago. Her smoking use included cigarettes. She started smoking about 53 years ago. She has a 20 pack-year smoking history. She has never used smokeless tobacco. She reports that she does not drink alcohol and does not use drugs.   ALLERGIES: Patient has no known allergies.   MEDICATIONS:  Current Outpatient Medications  Medication Sig Dispense Refill   oxyCODONE (OXY IR/ROXICODONE) 5 MG immediate release tablet Take 1 tablet (5 mg total) by mouth every 4 (four) hours as needed for severe pain (pain score 7-10). 60 tablet 0   Adalimumab (HUMIRA PEN) 40 MG/0.4ML PNKT      Ascorbic Acid (VITAMIN C) 1000 MG tablet Take 1,000 mg by mouth daily.     calcium carbonate (OS-CAL) 600 MG TABS Take 600 mg by mouth daily.     cholecalciferol (VITAMIN D ) 1000 UNITS tablet Take by mouth daily.     clonazePAM  (KLONOPIN ) 0.1 mg/mL SUSP Take 5 mLs (0.5 mg total) by mouth 2 (two) times daily as needed. 300 mL 1   denosumab  (PROLIA ) 60 MG/ML SOSY injection Inject 60 mg into the skin every 6 (six) months.     folic acid  (FOLVITE ) 1 MG tablet Take 2 mg by mouth daily.     meloxicam (MOBIC) 15 MG tablet Take 1 tablet by mouth as needed.     methotrexate (RHEUMATREX) 2.5 MG tablet Take 15 mg by mouth once a week. Caution:Chemotherapy. Protect from light.  PRN     NON FORMULARY Diltiazem 2%/Lidocaine5% compound Apply to rectum 3 times daily for 8 weeks 30 g 1   ondansetron  (ZOFRAN ) 8 MG tablet Take 1 tablet (8 mg total) by mouth every 8 (eight) hours as needed for nausea or vomiting. 30 tablet 1   prochlorperazine  (COMPAZINE ) 10 MG tablet Take 1 tablet (10 mg total) by mouth every 6 (six) hours as needed for nausea or vomiting. 30 tablet 1   traMADol  (ULTRAM ) 50 MG tablet Take 1 tablet (50 mg total) by mouth every 6 (six) hours  as needed. 30 tablet 0   No current facility-administered medications for this encounter.     REVIEW OF SYSTEMS: The patient reports that she is having progressive anorectal pain and discomfort, anorectal itching and burning, change in stool caliber and occasional blood per rectum. Sx interfere with sleep as it is hard for her to get comfortable. She is using meloxicam which is helpful but she is worried about taking it too often.     PHYSICAL EXAM:  Wt Readings from Last 3 Encounters:  01/31/24 (P) 127 lb 8 oz (57.8 kg)  01/27/24 126 lb 9.6 oz (57.4 kg)  01/18/24 132 lb (59.9 kg)  Temp Readings from Last 3 Encounters:  01/31/24 (!) (P) 97.1 F (36.2 C) ((P) Temporal)  01/27/24 98.8 F (37.1 C) (Temporal)  01/18/24 98 F (36.7 C) (Temporal)   BP Readings from Last 3 Encounters:  01/31/24 (P) 128/74  01/27/24 135/84  01/18/24 130/73   Pulse Readings from Last 3 Encounters:  01/31/24 (P) 80  01/27/24 85  01/18/24 98   Pain Assessment Pain Score: 4  Pain Loc: Rectum/10   Physical Exam Vitals and nursing note reviewed. Exam conducted with a chaperone present.  Constitutional:      General: She is not in acute distress. HENT:     Head: Normocephalic.  Eyes:     Extraocular Movements: Extraocular movements intact.  Cardiovascular:     Rate and Rhythm: Normal rate.  Pulmonary:     Effort: Pulmonary effort is normal. No respiratory distress.  Abdominal:     General: There is no distension.  Genitourinary:    Comments: External Genitalia/Perineum:  Normal appearance, small verrucous lesion on R perineum, no discharge, erythema or swelling noted  Speculum Exam: Vaginal mucosa appears pale and dry. Scattered telangiectasias noted along the vaginal walls. No active bleeding or lesions identified.  Cervix is atrophic, flush with the vaginal wall, and appears pale without lesions. Cervical os is closed. No discharge. No bleeding or discharge after pap smear samples  collected.  Digital exam: On digital vaginal exam, asymmetric firmness/tenderness noted on palpation of the the posterior vaginal wall most prominent on L compared to the R  Anorectal exam: perineum surrounding anal opening without obvious abnormality, no firmness to palpation externally surrounding anal opening  Digital rectal exam limited due to patient discomfort beginning around 1 cm into anal canal, no masses or nodularity palpated, good sphincter tone, no blood visualized on glove  Musculoskeletal:        General: Normal range of motion.  Skin:    General: Skin is warm and dry.  Neurological:     Mental Status: She is alert.     ECOG = 0   LABORATORY DATA:  Lab Results  Component Value Date   WBC 8.8 01/27/2024   HGB 14.7 01/27/2024   HCT 43.9 01/27/2024   MCV 95.9 01/27/2024   PLT 305 01/27/2024   Lab Results  Component Value Date   NA 136 01/27/2024   K 3.7 01/27/2024   CL 101 01/27/2024   CO2 31 01/27/2024   Lab Results  Component Value Date   ALT 11 01/27/2024   AST 19 01/27/2024   ALKPHOS 58 01/27/2024   BILITOT 0.4 01/27/2024      RADIOGRAPHY: CT ABDOMEN PELVIS W CONTRAST Result Date: 01/19/2024 CLINICAL DATA:  Rectal mass.  Rule out metastasis. EXAM: CT ABDOMEN AND PELVIS WITH CONTRAST TECHNIQUE: Multidetector CT imaging of the abdomen and pelvis was performed using the standard protocol following bolus administration of intravenous contrast. RADIATION DOSE REDUCTION: This exam was performed according to the departmental dose-optimization program which includes automated exposure control, adjustment of the mA and/or kV according to patient size and/or use of iterative reconstruction technique. CONTRAST:  75mL OMNIPAQUE  IOHEXOL  300 MG/ML  SOLN COMPARISON:  None Available. FINDINGS: Lower chest: The visualized Katrina [name] bases are clear. No intra-abdominal free air or free fluid. Hepatobiliary: The liver is unremarkable. No biliary ductal dilatation. The gallbladder  is unremarkable. Pancreas: Unremarkable. No pancreatic ductal dilatation or surrounding inflammatory changes. Spleen: Normal in size without focal abnormality. Adrenals/Urinary Tract: The adrenal glands unremarkable. There is no hydronephrosis on either  side. There is symmetric enhancement and excretion of contrast by both kidneys. The visualized ureters and urinary bladder appear unremarkable. Stomach/Bowel: Irregularity of the rectal wall with an ill-defined 1.5 x 2.0 cm hypodense lesion in keeping with known rectal mass. There is no bowel obstruction or active inflammation. Appendectomy. Vascular/Lymphatic: The abdominal aorta and IVC unremarkable. No portal venous gas. Top-normal lymph node in the posterior pelvis adjacent to the rectosigmoid measures approximately 9 mm in diameter (65/301 and coronal 44/601). This is concerning for local lymph node involvement. There is no adenopathy. Reproductive: Small calcified uterine fibroid. No suspicious adnexal masses. Other: None Musculoskeletal: No acute osseous pathology. IMPRESSION: 1. Irregularity of the rectal wall with an ill-defined 1.5 x 2.0 cm hypodense lesion in keeping with known rectal mass. 2. Top-normal lymph node in the posterior pelvis adjacent to the rectosigmoid concerning for local lymph node involvement. 3. No evidence of distant metastasis in the abdomen or pelvis. Electronically Signed   By: Vanetta Chou M.D.   On: 01/19/2024 14:55    PATHOLOGY:    Accession #: [id number]  Patient Name: Barcelo, Sanaya  Visit # : [id number]   MRN: [id number]  Physician: Shila Gustav ALONSO Carolyne Mar 10, 1956 (Age: 37) Gender: F  Collected Date: 01/18/2024  Received Date: 01/18/2024   FINAL DIAGNOSIS        1. Rectum, biopsy, distal lesion :       - INVASIVE MODERATELY DIFFERENTIATED SQUAMOUS CELL CARCINOMA, SEE NOTE        Diagnosis Note : Dr. LeGolvan reviewed the case and concurs with the above diagnosis.  Dr. Shila was notified on  01/19/2024.     IMPRESSION/PLAN:  Ms. Aina is a 68 yo F w/ a recent diagnosis of anal cancer, Stage IIB cT2N1aM0.  We reviewed the patient's diagnosis of anal cancer and discussed standard treatment approaches. Specifically, we explained that the standard of care in this setting involves concurrent chemotherapy and radiation therapy.  We emphasized the rationale for concurrent chemoradiation, which aims to maximize local control and preserve sphincter function.  She needs additional work up including PET scan scheduled for Wednesday. Pap smear performed today.  We explained that the standard radiation treatment course involves daily radiation therapy over approximately 5.5-6 weeks, typically to a total dose of 50.4-54 Gy in 28-30 fractions, directed to the primary tumor and regional lymph node basins.  We reviewed radiation planning: The first step in radiation planning will be a simulation CT scan, scheduled for Wednesday October 1st. We explained that this scan is used for treatment planning and may involve placement of skin markings. Immobilization devices may be used to improve reproducibility. A full bladder protocol may be used to reduce radiation dose to surrounding structures (e.g., small bowel). The simulation appointment takes approximately 45-60 minutes. Following simulation, radiation treatment planning takes approximately 1 week, during which the radiation oncology team will define target volumes (primary tumor and nodal areas), contour normal tissues (e.g., bladder, small bowel), and design a safe, technically feasible treatment plan.   We reviewed logistics of radiation treatment: Treatment is delivered Monday through Friday, with no treatments on weekends or holidays. Each daily treatment visit takes 30-45 minutes, though actual radiation delivery lasts only a few minutes. The patient will be seen by the radiation oncologist weekly during treatment to assess side effects, provide  supportive care, and monitor treatment tolerance.    We reviewed expected acute and late side effects, including but not limited to: -Dermatitis (skin irritation, redness, peeling in the  perianal, perineal, and inguinal regions) -Fatigue (generalized tiredness that may worsen over the course of treatment) -Diarrhea or bowel urgency (from rectal and small bowel irritation) -Urinary frequency or dysuria (bladder irritation) -Pain with bowel movements (proctitis and mucosal irritation) -Sexual dysfunction (particularly with pelvic dose exposure) -Vaginal irritation or discharge (if applicable)  Supportive measures, including skin care regimens, dietary modifications, anti-diarrheal medications, and pain management strategies, will be offered as needed.   We reviewed plans for multidisciplinary care: The patient is also under the care of medical oncology for systemic chemotherapy. We will coordinate closely to ensure concurrent initiation of chemotherapy and radiation. The treatment plan has been reviewed with the multidisciplinary team, including medical oncology and colorectal surgery, as appropriate.   The patient has been provided with contact information for the care team and encouraged to reach out with any questions or concerns.   Next Steps:  -Recommend alternating meloxicam with tylenol  -Also prescribed oxycodone 5 mg q4 hours for moderate/severe anorectal pain/discomfort, will likely be beneficial at night when she has trouble getting in a comfortable position for sleep -Fu pap smear results -Simulation CT scheduled for Wednesday morning -FU Wednesday afternoon's PET results -Planned start of radiation therapy during the week of October, pending treatment planning and readiness for chemotherapy -Follow-up with medical oncology for chemotherapy education and coordination    Total time spent today in preparation for this visit was 75 minutes. This included patient care, imaging  and path review, documentation, multidisciplinary discussion and coordination of care and follow up.    Estefana HERO. Maritza, M.D.

## 2024-01-31 ENCOUNTER — Telehealth: Payer: Self-pay | Admitting: Oncology

## 2024-01-31 ENCOUNTER — Ambulatory Visit
Admission: RE | Admit: 2024-01-31 | Discharge: 2024-01-31 | Disposition: A | Discharge status: Home / Self Care | Source: Ambulatory Visit | Attending: Radiation Oncology | Admitting: Radiation Oncology

## 2024-01-31 ENCOUNTER — Ambulatory Visit
Admission: RE | Admit: 2024-01-31 | Discharge: 2024-01-31 | Disposition: A | Source: Ambulatory Visit | Attending: Radiation Oncology | Admitting: Radiation Oncology

## 2024-01-31 ENCOUNTER — Encounter: Payer: Self-pay | Admitting: Radiation Oncology

## 2024-01-31 DIAGNOSIS — N7091 Salpingitis, unspecified: Secondary | ICD-10-CM | POA: Insufficient documentation

## 2024-01-31 DIAGNOSIS — M069 Rheumatoid arthritis, unspecified: Secondary | ICD-10-CM | POA: Insufficient documentation

## 2024-01-31 DIAGNOSIS — Z803 Family history of malignant neoplasm of breast: Secondary | ICD-10-CM | POA: Insufficient documentation

## 2024-01-31 DIAGNOSIS — Z801 Family history of malignant neoplasm of trachea, bronchus and lung: Secondary | ICD-10-CM | POA: Insufficient documentation

## 2024-01-31 DIAGNOSIS — Z860100 Personal history of colon polyps, unspecified: Secondary | ICD-10-CM | POA: Insufficient documentation

## 2024-01-31 DIAGNOSIS — Z9221 Personal history of antineoplastic chemotherapy: Secondary | ICD-10-CM | POA: Insufficient documentation

## 2024-01-31 DIAGNOSIS — D259 Leiomyoma of uterus, unspecified: Secondary | ICD-10-CM | POA: Insufficient documentation

## 2024-01-31 DIAGNOSIS — C44520 Squamous cell carcinoma of anal skin: Secondary | ICD-10-CM

## 2024-01-31 DIAGNOSIS — Z01411 Encounter for gynecological examination (general) (routine) with abnormal findings: Secondary | ICD-10-CM | POA: Insufficient documentation

## 2024-01-31 DIAGNOSIS — M810 Age-related osteoporosis without current pathological fracture: Secondary | ICD-10-CM | POA: Insufficient documentation

## 2024-01-31 DIAGNOSIS — Z79631 Long term (current) use of antimetabolite agent: Secondary | ICD-10-CM | POA: Insufficient documentation

## 2024-01-31 DIAGNOSIS — Z1151 Encounter for screening for human papillomavirus (HPV): Secondary | ICD-10-CM | POA: Insufficient documentation

## 2024-01-31 DIAGNOSIS — Z87891 Personal history of nicotine dependence: Secondary | ICD-10-CM | POA: Insufficient documentation

## 2024-01-31 DIAGNOSIS — C218 Malignant neoplasm of overlapping sites of rectum, anus and anal canal: Secondary | ICD-10-CM

## 2024-01-31 DIAGNOSIS — Z8052 Family history of malignant neoplasm of bladder: Secondary | ICD-10-CM | POA: Insufficient documentation

## 2024-01-31 MED ORDER — OXYCODONE HCL 5 MG PO TABS: 5.0000 mg | ORAL_TABLET | ORAL | 0 refills | Status: DC | PRN

## 2024-01-31 NOTE — Progress Notes (Signed)
 PATIENT NAVIGATOR PROGRESS NOTE  Name: Katrina [name] Date: 01/31/2024 MRN: [id number]  DOB: Aug 21, 1955   Patient called office with some follow-up questions regarding her upcoming appointments, picc line placement, and treatments.  Patient was given some verbal education on how to care for picc line once it has been placed.  Patient has been scheduled for chemo education class on 10/6 and was informed she would get more detail education on picc line and chemo pump care.  Patient will take her last dose of humira today and her last dose of methotrexate on Friday 10/3. Patient verbalized understanding to all and agreed to call office with any questions or concerns after today.   Time spent counseling/coordinating care: 45-60 minutes

## 2024-01-31 NOTE — Progress Notes (Signed)
 GI Location of Tumor / Histology: Primary squamous cell carcinoma of the anal canal   Katrina [name] initially presented in July with rectal pain, rectal itching, rectal bleeding and change in stool caliber.  Biopsies of  (if applicable) revealed:   Past/Anticipated interventions by surgeon, if any:   Past/Anticipated interventions by medical oncology, if any:DR. Pasam  Weight changes, if any:  Wt Readings from Last 3 Encounters: 01/31/24          127.8 lb  01/27/24 126 lb 9.6 oz (57.4 kg)  01/18/24 132 lb (59.9 kg)      Bowel/Bladder complaints, if any: yes, bowels. She reports taking Maallox to stay regular.  Nausea / Vomiting, if any: no  Pain issues, if any:  Yes, to rectum   Any blood per rectum:   Yes  SAFETY ISSUES: Prior radiation? No Pacemaker/ICD? No Possible current pregnancy? No Is the patient on methotrexate? Yes  Current Complaints/Details:  BP (P) 128/74 (BP Location: Left Arm, Patient Position: Sitting)   Pulse (P) 80   Temp (!) (P) 97.1 F (36.2 C) (Temporal)   Resp (P) 18   Ht (P) 5' 2 (1.575 m)   Wt (P) 127 lb 8 oz (57.8 kg)   LMP 05/04/2002 (Approximate)   BMI (P) 23.32 kg/m

## 2024-01-31 NOTE — Telephone Encounter (Signed)
 Scheduled patient for chemo education and her labs and f/u appointment. Called and spoke with the patient, she is aware.

## 2024-02-01 ENCOUNTER — Encounter: Payer: Self-pay | Admitting: Internal Medicine

## 2024-02-01 ENCOUNTER — Other Ambulatory Visit: Payer: Self-pay

## 2024-02-01 ENCOUNTER — Encounter: Payer: Self-pay | Admitting: Physical Therapy

## 2024-02-01 ENCOUNTER — Ambulatory Visit: Attending: Oncology | Admitting: Physical Therapy

## 2024-02-01 ENCOUNTER — Encounter: Payer: Self-pay | Admitting: Oncology

## 2024-02-01 DIAGNOSIS — C44520 Squamous cell carcinoma of anal skin: Secondary | ICD-10-CM | POA: Diagnosis not present

## 2024-02-01 DIAGNOSIS — R278 Other lack of coordination: Secondary | ICD-10-CM | POA: Insufficient documentation

## 2024-02-01 DIAGNOSIS — R252 Cramp and spasm: Secondary | ICD-10-CM | POA: Insufficient documentation

## 2024-02-01 NOTE — Therapy (Signed)
 OUTPATIENT PHYSICAL THERAPY FEMALE PELVIC EVALUATION   Patient Name: Katrina [name] MRN: [id number] DOB:Jun 12, 1955, 68 y.o., female Today's Date: 02/01/2024  END OF SESSION:  PT End of Session - 02/01/24 1132     Visit Number 1    Date for Recertification  05/02/24    Authorization Type aetna [id number]  no auth req  medical necessity    Progress Note Due on Visit 10    PT Start Time 1100    PT Stop Time 1142    PT Time Calculation (min) 42 min    Activity Tolerance Patient tolerated treatment well;Patient limited by pain    Behavior During Therapy [facility] for tasks assessed/performed          Past Medical History:  Diagnosis Date   Arthritis    --rheumatoid   Colon polyp    Osteoporosis 04/05/14   T score -2.6 spine   Salpingitis    Had appendectomy at same time as salpingitis dx was made.   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  03/15/2018   Chronic cough 01/27/2018   Rheumatoid arthritis (HCC) 04/05/2011    PCP: [name], MD  REFERRING PROVIDER: Autumn Millman, MD  REFERRING DIAG: [phone] (ICD-10-CM) - Primary squamous cell carcinoma of anal canal  THERAPY DIAG:  Cramp and spasm  Other lack of coordination  Rationale for Evaluation and Treatment: Rehabilitation  ONSET DATE: July 2025  SUBJECTIVE:                                                                                                                                                                                            SUBJECTIVE STATEMENT: Patient recently diagnosed with anal cancer. Patient reports that she has pain in the anal canal.  She awaiting radiation treatment Tx will be 28 sessions, 5 weeks.  Has a tumor that is obstructing the canal, takes miralax daily Sitting is uncomfortable. She has been walking. Used to do yoga and swim but not since  the diagnosis. She is nervous about the upcoming treatment.  Fluid intake: water  FUNCTIONAL LIMITATIONS: pretty healthy  PERTINENT HISTORY:  Medications for current condition: Mobic Surgeries: no Other: - Sexual abuse: No  DIAGNOSTIC FINDINGS:  Post-void residual: Voiding Cystourethrogram (VCUG):  Ultrasound: PAIN:  Are you having pain? yes NPRS scale: 4-7/10 Pain location: Internal  Pain type: aching, burning, and sharp Pain description: intermittent   Aggravating factors: not Relieving factors: Meloxicam  PRECAUTIONS: None  RED FLAGS: None   WEIGHT BEARING RESTRICTIONS: No  FALLS:  Has patient fallen in last 6 months? No  OCCUPATION: retired  ACTIVITY LEVEL : pretty active, walks every day, used to do yoga, swims  PLOF: Independent  PATIENT GOALS: does not have any now   BOWEL MOVEMENT: Pain with bowel movement: Yes Type of bowel movement:Type (Bristol Stool Scale) 4, Frequency 3-5 times, Strain no, and Splinting no Fully empty rectum: no Leakage: some                                                   Pads: No Fiber supplement/laxative Yes miralax  URINATION:no issues  INTERCOURSE: not active   PREGNANCY:no   PROLAPSE: None   OBJECTIVE:  Note: Objective measures were completed at Evaluation unless otherwise noted.    PATIENT SURVEYS:    PFIQ-7: 90  COGNITION: Overall cognitive status: Within functional limits for tasks assessed     SENSATION: Light touch: Appears  intact  LUMBAR SPECIAL TESTS:  Slump test: Negative  FUNCTIONAL TESTS:  5 times sit to stand: 7 Single leg stance: mild valgus bilat   Sit-up test:1/3  Squat:within functional limitations  Bed mobility:within functional limitations   GAIT: Assistive device utilized: None Comments: within functional limitations   POSTURE: No Significant postural limitations   LUMBARAROM/PROM:  A/PROM A/PROM  Eval (% available)  Flexion 90  Extension 90  Right lateral flexion 90  Left lateral flexion 90  Right rotation 90  Left rotation 90   (Blank rows = not tested)  LOWER EXTREMITY ROM: within functional limitations    LOWER EXTREMITY MMT:5/5   PALPATION:  General: tight and tender external and internal anal sphincter  Pelvic Alignment: even  Abdominal: able to dem 360 breathing with relaxed shoulders  Diastasis: No Distortion: No  Breathing: able to dem 360 breathing with relaxed shoulders Scar tissue: no                External Perineal Exam: within functional limitations                              Internal Pelvic Floor: tight and tender external and internal sphincters  Patient confirms identification and approves PT to assess internal pelvic floor and treatment No  PELVIC MMT:   MMT eval  Vaginal   Internal Anal Sphincter 5/5  External Anal Sphincter 5/5  Puborectalis   Diastasis Recti no  (Blank rows = not tested)        TONE: high  PROLAPSE: no  TODAY'S TREATMENT:  DATE: 02/01/2024  EVAL  Examination completed, findings reviewed, pt educated on POC, HEP, and female pelvic floor anatomy, reasoning with pelvic floor assessment internally with pt consent. Pt motivated to participate in PT and agreeable to attempt recommendations.     PATIENT EDUCATION/ there acts:   Education details: Pt was educated on relevant anatomy, exam  findings, home exercise program, plan of care, expectations of PT and healthy bowel PT recommendations  Person educated: Patient Education method: Explanation, Demonstration, Tactile cues, Verbal cues, and Handouts Education comprehension: verbalized understanding, returned demonstration, verbal cues required, tactile cues required, and needs further education  HOME EXERCISE PROGRAM: Access Code: [email or web address] URL: [email or web address] Date: 02/01/2024 Prepared by: Cori Damariz Paganelli  Exercises - Cat Cow  - 1 x daily - 7 x weekly - 2 sets - 10 reps - Supine Figure 4 Piriformis Stretch  - 1 x daily - 7 x weekly - 2 sets - 10 reps - Child's Pose Stretch  - 1 x daily - 7 x weekly - 2 sets - 10 reps - Supine Diaphragmatic Breathing with Pelvic Floor Lengthening  - 1 x daily - 7 x weekly - 2 sets - 10 reps - Pelvic Floor Lengthening in Hooklying  - 1 x daily - 7 x weekly - 2 sets - 10 reps  Patient Education - Get To Know Your Pelvic Floor- Female - High-Fiber Diet to Support Pelvic Health - Abdominal Massage for Constipation - Abdominal Massage for Constipation  ASSESSMENT:  CLINICAL IMPRESSION: Patient is a 68 y.o. F who was seen today for physical therapy evaluation and treatment for physical therapy assessment prior to radiation for anal cancer. Patient with tight and tender internal and external anal sphincter and high tone pelvic floor. Decreased coordination, likely due to guarding and fear of having a fecal accident. Findings notable for good trunk flexibility and diaphragmatic breathing.   OBJECTIVE IMPAIRMENTS: decreased coordination, decreased ROM, impaired tone, and pain.   ACTIVITY LIMITATIONS: sitting, continence, and toileting  PARTICIPATION LIMITATIONS: community activity  PERSONAL FACTORS: Fitness and 1 comorbidity: anal cancer are also affecting patient's functional outcome.   REHAB POTENTIAL: Good  CLINICAL DECISION MAKING: Evolving/moderate  complexity  EVALUATION COMPLEXITY: Moderate   GOALS: Goals reviewed with patient? Yes  SHORT TERM GOALS: Target date: 02/29/2024    Pt will be independent with HEP.   Baseline: Goal status: INITIAL  2.  Pt will be independent with use of squatty potty, relaxed toileting mechanics, and improved bowel movement techniques in order to increase ease of bowel movements and complete evacuation.   Baseline:  Goal status: INITIAL  3.  Patient will be educated on rectal dilators Baseline:  Goal status: INITIAL  4.  Patient will soak 0 pads/ day Baseline:  Goal status: INITIAL   LONG TERM GOALS: Target date: 04/25/2024    Pt will be independent with advanced HEP.   Baseline:  Goal status: INITIAL  2.  Patient will be I with use of dilators  Baseline:  Goal status: INITIAL  3.  Patient will have regular bowel movements, bristol stool scale type 3-4 Baseline:  Goal status: INITIAL  4.  Patient will participate in 40 minute PT session without increased pain Baseline:  Goal status: INITIAL  5.  Patient will be able to sit as long as needed- at least 1 hr- without increased pain Baseline:  Goal status: INITIAL  6.  Patient will report no fecal accidents/ day Baseline:  Goal status: INITIAL  PLAN:  PT FREQUENCY: 1-2x/week  PT  DURATION: 12 weeks  PLANNED INTERVENTIONS: 97110-Therapeutic exercises, 97530- Therapeutic activity, 97112- Neuromuscular re-education, [phone]- Self Care, 02859- Manual therapy, [phone]- Gait training, [phone] (1-2 muscles), 20561 (3+ muscles)- Dry Needling, Patient/Family education, Balance training, Taping, Joint mobilization, Joint manipulation, Spinal manipulation, Spinal mobilization, Manual lymph drainage, Scar mobilization, Cryotherapy, Moist heat, and Biofeedback  PLAN FOR NEXT SESSION: reassessment after radiation, educate patient on dilators, continue exercises for down training/ up training as needed   Irvin Bastin, PT 02/01/2024, 1:41 PM

## 2024-02-01 NOTE — Addendum Note (Signed)
 Encounter addended by: Maritza Stagger, MD on: 02/01/2024 9:31 AM  Actions taken: Problem List modified, Visit diagnoses modified, Clinical Note Signed

## 2024-02-02 ENCOUNTER — Ambulatory Visit: Admission: RE | Admit: 2024-02-02 | Discharge: 2024-02-02 | Attending: Radiation Oncology

## 2024-02-02 ENCOUNTER — Other Ambulatory Visit: Payer: Self-pay

## 2024-02-02 ENCOUNTER — Inpatient Hospital Stay: Attending: Oncology

## 2024-02-02 ENCOUNTER — Encounter (HOSPITAL_COMMUNITY)
Admission: RE | Admit: 2024-02-02 | Discharge: 2024-02-02 | Disposition: A | Discharge status: Home / Self Care | Source: Ambulatory Visit | Attending: Oncology | Admitting: Oncology

## 2024-02-02 ENCOUNTER — Ambulatory Visit
Admission: RE | Admit: 2024-02-02 | Discharge: 2024-02-02 | Disposition: A | Discharge status: Home / Self Care | Source: Ambulatory Visit | Attending: Radiation Oncology | Admitting: Radiation Oncology

## 2024-02-02 ENCOUNTER — Ambulatory Visit: Admitting: Pediatrics

## 2024-02-02 VITALS — BP 131/79 | HR 69 | Temp 98.5°F | Resp 16

## 2024-02-02 DIAGNOSIS — K5900 Constipation, unspecified: Secondary | ICD-10-CM | POA: Insufficient documentation

## 2024-02-02 DIAGNOSIS — Z510 Encounter for antineoplastic radiation therapy: Secondary | ICD-10-CM | POA: Insufficient documentation

## 2024-02-02 DIAGNOSIS — G4700 Insomnia, unspecified: Secondary | ICD-10-CM | POA: Insufficient documentation

## 2024-02-02 DIAGNOSIS — Z5111 Encounter for antineoplastic chemotherapy: Secondary | ICD-10-CM | POA: Insufficient documentation

## 2024-02-02 DIAGNOSIS — C218 Malignant neoplasm of overlapping sites of rectum, anus and anal canal: Secondary | ICD-10-CM | POA: Insufficient documentation

## 2024-02-02 DIAGNOSIS — F419 Anxiety disorder, unspecified: Secondary | ICD-10-CM | POA: Insufficient documentation

## 2024-02-02 DIAGNOSIS — C44520 Squamous cell carcinoma of anal skin: Secondary | ICD-10-CM | POA: Diagnosis not present

## 2024-02-02 DIAGNOSIS — M810 Age-related osteoporosis without current pathological fracture: Secondary | ICD-10-CM | POA: Insufficient documentation

## 2024-02-02 DIAGNOSIS — K1230 Oral mucositis (ulcerative), unspecified: Secondary | ICD-10-CM | POA: Insufficient documentation

## 2024-02-02 DIAGNOSIS — M069 Rheumatoid arthritis, unspecified: Secondary | ICD-10-CM | POA: Insufficient documentation

## 2024-02-02 LAB — GLUCOSE, CAPILLARY: Glucose-Capillary: 95 mg/dL (ref 70–99)

## 2024-02-02 MED ORDER — FLUDEOXYGLUCOSE F - 18 (FDG) INJECTION
6.3500 | Freq: Once | INTRAVENOUS | Status: AC
  Administered 2024-02-02: 6.42 via INTRAVENOUS

## 2024-02-02 NOTE — Progress Notes (Signed)
 The proposed treatment discussed in conference is for discussion purpose only and is not a binding recommendation.  The patients have not been physically examined, or presented with their treatment options.  Therefore, final treatment plans cannot be decided.

## 2024-02-02 NOTE — Progress Notes (Signed)
 [facility] Clinical Social Work  Clinical Social Work was referred by Statistician for assessment of psychosocial needs.  Clinical Social Worker contacted patient by phone to offer support and assess for needs. CSW explained role on team and purpose for call. Patient scheduled for Initial Assessment on 10/03 at 1:30.  Lizbeth Sprague, LCSW  Clinical Social Worker [facility]

## 2024-02-02 NOTE — Progress Notes (Signed)
 PATIENT NAVIGATOR PROGRESS NOTE  Name: Katrina [name] Date: 02/02/2024 MRN: [id number]  DOB: 1956/03/14   Patient called office asking to confirm chemo and radiation start date.  Patient was in office to see Dr. Maritza and for her CT simulation.  Confirmed with Dr. Pasam and Dr. Lanier that treatment start date will be planned for 10/13.  Patient was made aware of confirmation of treatment start date and verbalized understanding.    Time spent counseling/coordinating care: 45-60 minutes

## 2024-02-02 NOTE — Progress Notes (Signed)
 Has armband been applied?  Yes.    Does patient have an allergy to IV contrast dye?: No.   Has patient ever received premedication for IV contrast dye?: No.   Date of lab work: January 27, 2024 BUN: 10 CR: 0.66 eGFR: >60  Does patient take metformin?: No.  Is eGFR >60?: Yes.   If no, when can patient resume? (Must be 48 hrs AFTER they receive IV contrast):  N/A  IV site: antecubital right, condition patent and no redness  Has IV site been added to flowsheet?  Yes.    BP 131/79 (BP Location: Left Arm, Patient Position: Sitting)   Pulse 69   Temp 98.5 F (36.9 C) (Oral)   Resp 16   LMP 05/04/2002 (Approximate)   SpO2 100%

## 2024-02-03 ENCOUNTER — Encounter

## 2024-02-03 ENCOUNTER — Other Ambulatory Visit: Payer: Self-pay | Admitting: Radiation Oncology

## 2024-02-03 LAB — CYTOLOGY - PAP
Comment: NEGATIVE
Diagnosis: UNDETERMINED — AB
High risk HPV: NEGATIVE

## 2024-02-03 MED ORDER — HYDROXYZINE HCL 10 MG PO TABS: 10.0000 mg | ORAL_TABLET | Freq: Three times a day (TID) | ORAL | 0 refills | Status: AC | PRN

## 2024-02-03 MED ORDER — ESCITALOPRAM OXALATE 10 MG PO TABS: 10.0000 mg | ORAL_TABLET | Freq: Every day | ORAL | 11 refills | Status: AC

## 2024-02-03 NOTE — Progress Notes (Signed)
 PATIENT NAVIGATOR PROGRESS NOTE  Name: Katrina [name] Date: 02/03/2024 MRN: [id number]  DOB: [birth year]   Reason for visit:  Patient Phone Call  Comments:   Patient called office with questions about chemo treatment dates.  Patient understood that her first week of chemo treatments would start on 10/13 but patient wanted to know when her second week of chemo treatments would be.  Informed patient her second week of chemo treatments would start 11/10.  Patient verbalized understanding.    Time spent counseling/coordinating care: 15-30 minutes

## 2024-02-04 ENCOUNTER — Ambulatory Visit

## 2024-02-04 NOTE — Progress Notes (Signed)
 [facility] Clinical Social Work  Initial Assessment   Katrina [name] is a 68 y.o. year old female presenting alone. Clinical Social Work was referred by nurse navigator for assessment of psychosocial needs.   SDOH (Social Determinants of Health) assessments performed: Yes SDOH Interventions    Flowsheet Row Office Visit from 05/17/2023 in Katrina Norleen Hailstone, MD  SDOH Interventions   Food Insecurity Interventions Intervention Not Indicated  Housing Interventions Intervention Not Indicated  Transportation Interventions Intervention Not Indicated  Utilities Interventions Intervention Not Indicated  Physical Activity Interventions Intervention Not Indicated  Stress Interventions Intervention Not Indicated  Health Literacy Interventions Intervention Not Indicated    SDOH Screenings   Food Insecurity: No Food Insecurity (01/31/2024)  Housing: Low Risk  (01/31/2024)  Transportation Needs: No Transportation Needs (11/18/2023)  Utilities: Not At Risk (01/31/2024)  Alcohol Screen: Low Risk  (05/10/2020)  Depression (PHQ2-9): Low Risk  (01/27/2024)  Financial Resource Strain: Low Risk  (11/18/2023)  Physical Activity: Sufficiently Active (11/18/2023)  Social Connections: Moderately Integrated (11/18/2023)  Stress: No Stress Concern Present (11/18/2023)  Tobacco Use: Medium Risk (02/01/2024)  Health Literacy: Adequate Health Literacy (05/12/2023)    PHQ 2/9:    01/27/2024   11:16 AM 05/17/2023    2:58 PM 08/10/2022    3:31 PM  Depression screen PHQ 2/9  Decreased Interest 0 0 0  Down, Depressed, Hopeless 0 0 0  PHQ - 2 Score 0 0 0     Distress Screen completed: No     No data to display            Family/Social Information:  Housing Arrangement: patient lives with her spouse in [place], Family members/support persons in your life? Katrina [name] named an extensive network of support that includes her spouse and many friends. Transportation concerns: No transportation concerns at this time.    Employment: Retired .  Income source: Actor concerns: No Type of concern: None Food access concerns: no Religious or spiritual practice: Yes, Katrina [name] has a vast spiritual practice that has connected her to many people in her support system and has increased her ability to manage the diagnosis.  Advanced directives: No  Coping/ Adjustment to diagnosis: Patient understands treatment plan and what happens next? Yes, Katrina [name] discussed cancer history up until the present. Katrina [name] is aware of treatment interventions and treatment goals.  Concerns about diagnosis and/or treatment: Katrina [name] denied any specific treatment concerns.  Patient reported stressors: No reported stressors now. She's managing pain levels to best of her ability and is preparing for potential side effects of treatment.  Current coping skills/ strengths: Ability for insight , Active sense of humor , Average or above average intelligence , Capable of independent living , Communication skills , Financial means , General fund of knowledge , Motivation for treatment/growth , Physical Health , Religious Affiliation , Special hobby/interest , and Supportive family/friends     SUMMARY: Current SDOH Barriers:  No SDOH barriers at this time.   Clinical Social Work Clinical Goal(s):  No clinical social work goals at this time  Interventions: Discussed common feeling and emotions when being diagnosed with cancer, and the importance of support during treatment Discussed process of diagnosis and how spirituality plays a role in her life Discussed support systems accessible to her if needed.  Informed patient of the support team roles and support services at [facility] Provided CSW contact information and encouraged patient to call with any questions or concerns    Follow Up Plan: CSW will continue to  follow patient and have interval check ins  Patient verbalizes understanding of plan: Yes    Katrina Sprague,  LCSW Clinical Social Worker [facility]

## 2024-02-07 ENCOUNTER — Inpatient Hospital Stay

## 2024-02-07 DIAGNOSIS — C218 Malignant neoplasm of overlapping sites of rectum, anus and anal canal: Secondary | ICD-10-CM

## 2024-02-07 NOTE — Progress Notes (Signed)
 Pharmacist Chemotherapy Monitoring - Initial Assessment    Anticipated start date: 02/14/24   The following has been reviewed per standard work regarding the patient's treatment regimen: The patient's diagnosis, treatment plan and drug doses, and organ/hematologic function Lab orders and baseline tests specific to treatment regimen  The treatment plan start date, drug sequencing, and pre-medications Prior authorization status  Patient's documented medication list, including drug-drug interaction screen and prescriptions for anti-emetics and supportive care specific to the treatment regimen The drug concentrations, fluid compatibility, administration routes, and timing of the medications to be used The patient's access for treatment and lifetime cumulative dose history, if applicable  The patient's medication allergies and previous infusion related reactions, if applicable   Changes made to treatment plan:  N/A  Follow up needed:  N/A   Kanoe Wanner, Pharm.D., CPP 02/07/2024@1 :07 PM

## 2024-02-08 ENCOUNTER — Telehealth: Payer: Self-pay

## 2024-02-08 NOTE — Telephone Encounter (Signed)
 Patient called in to report that she is having some loose stools and upset stomach, and just not feeling right since starting lexapro. Patient is asking if this is normal? Please advise   Per Dr. Maritza ku can be common but temporary side effects in the beginning as the body adjusts. They usually resolve in 1-2 weeks. She should make sure to eat when she takes the medicine. Also recommend she temporarily avoid foods that could cause similar symptoms like caffeine or spicy foods. If loose stools severe that she feels dehydrated or if stomach pain severe she should stop. If symptoms are not severe would recommend continuing and we can reassess next week.    Call and made patient aware. She  voiced understanding.

## 2024-02-09 ENCOUNTER — Ambulatory Visit: Admitting: Radiation Oncology

## 2024-02-10 ENCOUNTER — Ambulatory Visit

## 2024-02-10 ENCOUNTER — Other Ambulatory Visit: Payer: Self-pay | Admitting: Radiology

## 2024-02-11 ENCOUNTER — Inpatient Hospital Stay

## 2024-02-11 ENCOUNTER — Encounter: Payer: Self-pay | Admitting: Oncology

## 2024-02-11 ENCOUNTER — Ambulatory Visit

## 2024-02-11 ENCOUNTER — Other Ambulatory Visit: Payer: Self-pay | Admitting: Oncology

## 2024-02-11 ENCOUNTER — Inpatient Hospital Stay: Admitting: Oncology

## 2024-02-11 ENCOUNTER — Ambulatory Visit (HOSPITAL_COMMUNITY)
Admission: RE | Admit: 2024-02-11 | Discharge: 2024-02-11 | Disposition: A | Discharge status: Home / Self Care | Source: Ambulatory Visit | Attending: Oncology | Admitting: Oncology

## 2024-02-11 ENCOUNTER — Other Ambulatory Visit

## 2024-02-11 VITALS — BP 118/76 | HR 86 | Temp 97.7°F | Resp 18 | Ht 62.0 in | Wt 129.4 lb

## 2024-02-11 DIAGNOSIS — K5900 Constipation, unspecified: Secondary | ICD-10-CM | POA: Diagnosis not present

## 2024-02-11 DIAGNOSIS — C44520 Squamous cell carcinoma of anal skin: Secondary | ICD-10-CM | POA: Insufficient documentation

## 2024-02-11 DIAGNOSIS — Z510 Encounter for antineoplastic radiation therapy: Secondary | ICD-10-CM | POA: Diagnosis not present

## 2024-02-11 DIAGNOSIS — C210 Malignant neoplasm of anus, unspecified: Secondary | ICD-10-CM | POA: Diagnosis not present

## 2024-02-11 DIAGNOSIS — C218 Malignant neoplasm of overlapping sites of rectum, anus and anal canal: Secondary | ICD-10-CM

## 2024-02-11 DIAGNOSIS — K1230 Oral mucositis (ulcerative), unspecified: Secondary | ICD-10-CM | POA: Diagnosis not present

## 2024-02-11 DIAGNOSIS — Z5111 Encounter for antineoplastic chemotherapy: Secondary | ICD-10-CM | POA: Diagnosis present

## 2024-02-11 DIAGNOSIS — F419 Anxiety disorder, unspecified: Secondary | ICD-10-CM | POA: Diagnosis not present

## 2024-02-11 DIAGNOSIS — F5109 Other insomnia not due to a substance or known physiological condition: Secondary | ICD-10-CM | POA: Diagnosis not present

## 2024-02-11 DIAGNOSIS — G4700 Insomnia, unspecified: Secondary | ICD-10-CM | POA: Diagnosis not present

## 2024-02-11 DIAGNOSIS — M069 Rheumatoid arthritis, unspecified: Secondary | ICD-10-CM | POA: Diagnosis not present

## 2024-02-11 DIAGNOSIS — M810 Age-related osteoporosis without current pathological fracture: Secondary | ICD-10-CM | POA: Diagnosis not present

## 2024-02-11 LAB — CMP (CANCER CENTER ONLY)
ALT: 14 U/L (ref 0–44)
AST: 26 U/L (ref 15–41)
Albumin: 4.2 g/dL (ref 3.5–5.0)
Alkaline Phosphatase: 57 U/L (ref 38–126)
Anion gap: 9 (ref 5–15)
BUN: 16 mg/dL (ref 8–23)
CO2: 27 mmol/L (ref 22–32)
Calcium: 9.5 mg/dL (ref 8.9–10.3)
Chloride: 101 mmol/L (ref 98–111)
Creatinine: 0.54 mg/dL (ref 0.44–1.00)
GFR, Estimated: >60 mL/min (ref >60)
Glucose, Bld: 112 mg/dL — ABNORMAL HIGH (ref 70–99)
Potassium: 3.9 mmol/L (ref 3.5–5.1)
Sodium: 136 mmol/L (ref 135–145)
Total Bilirubin: 0.4 mg/dL (ref 0.0–1.2)
Total Protein: 7.2 g/dL (ref 6.5–8.1)

## 2024-02-11 LAB — CBC WITH DIFFERENTIAL (CANCER CENTER ONLY)
Abs Immature Granulocytes: 0.04 K/uL (ref 0.00–0.07)
Basophils Absolute: 0.1 K/uL (ref 0.0–0.1)
Basophils Relative: 1 %
Eosinophils Absolute: 0.1 K/uL (ref 0.0–0.5)
Eosinophils Relative: 1 %
HCT: 40.7 % (ref 36.0–46.0)
Hemoglobin: 14.1 g/dL (ref 12.0–15.0)
Immature Granulocytes: 0 %
Lymphocytes Relative: 17 %
Lymphs Abs: 2.2 K/uL (ref 0.7–4.0)
MCH: 32.2 pg (ref 26.0–34.0)
MCHC: 34.6 g/dL (ref 30.0–36.0)
MCV: 92.9 fL (ref 80.0–100.0)
Monocytes Absolute: 1.4 K/uL — ABNORMAL HIGH (ref 0.1–1.0)
Monocytes Relative: 11 %
Neutro Abs: 9.2 K/uL — ABNORMAL HIGH (ref 1.7–7.7)
Neutrophils Relative %: 70 %
Platelet Count: 323 K/uL (ref 150–400)
RBC: 4.38 MIL/uL (ref 3.87–5.11)
RDW: 12.9 % (ref 11.5–15.5)
WBC Count: 12.9 K/uL — ABNORMAL HIGH (ref 4.0–10.5)
nRBC: 0 % (ref 0.0–0.2)

## 2024-02-11 MED ORDER — LORAZEPAM 0.5 MG PO TABS: 0.5000 mg | ORAL_TABLET | Freq: Every day | ORAL | 0 refills | Status: AC

## 2024-02-11 MED ORDER — LIDOCAINE HCL 1 % IJ SOLN
INTRAMUSCULAR | Status: AC
  Filled 2024-02-11: qty 20

## 2024-02-11 MED ORDER — LIDOCAINE-EPINEPHRINE 1 %-1:100000 IJ SOLN
20.0000 mL | Freq: Once | INTRAMUSCULAR | Status: AC
  Administered 2024-02-11: 5 mL via INTRADERMAL

## 2024-02-11 MED ORDER — HEPARIN SOD (PORK) LOCK FLUSH 100 UNIT/ML IV SOLN
500.0000 [IU] | Freq: Once | INTRAVENOUS | Status: AC
Start: 2024-02-11 — End: 2024-02-11
  Administered 2024-02-11: 500 [IU] via INTRAVENOUS

## 2024-02-11 MED ORDER — HEPARIN SOD (PORK) LOCK FLUSH 100 UNIT/ML IV SOLN
INTRAVENOUS | Status: AC
  Filled 2024-02-11: qty 5

## 2024-02-11 NOTE — Procedures (Signed)
 Right double lumen basilic vein PICC placed. Length 39cm. Tip SVC RA junction. Okay to use. Medication used: 1% lidocaine to skin and subcutaneous tissue. No blood loss. No immediate complications.   Katrina [name] B Joaovictor Krone NP 02/11/2024 9:18 AM

## 2024-02-11 NOTE — Progress Notes (Signed)
 [facility]  ONCOLOGY CLINIC PROGRESS NOTE   Patient Care Team: Baxley, Ronal PARAS, MD as PCP - General (Internal Medicine) [name] [name], MD as Consulting Physician (Obstetrics and Gynecology) Nandigam, Kavitha V, MD as Consulting Physician (Gastroenterology) Dewey Rush, MD as Consulting Physician (Radiation Oncology) Autumn Millman, MD as Consulting Physician (Oncology) Ardis Evalene CROME, RN as Oncology Nurse Navigator  PATIENT NAME: Katrina [name]   MR#: [id number] DOB: May 10, 1955  Date of visit: 02/11/2024   ASSESSMENT & PLAN:   Katrina [name] is a 68 y.o.  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   Malignant neoplasm of overlapping sites of rectum, anus and anal canal [facility]) Please review oncology history for additional details and timeline of events.  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 Biopsies confirm squamous cell carcinoma.   CT abdomen and pelvis on 01/19/2024 showed irregularity of the rectal wall with an ill-defined 1.5 x 2 cm hypodense lesion, in keeping with known rectal mass. 9 mm lymph node in the posterior pelvis adjacent to the rectosigmoid, concerning for local lymph node involvement.  No evidence of distant metastasis in the abdomen or pelvis.  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    On 02/02/2024, staging PET scan showed intense hypermetabolic activity throughout the anus, consistent with known primary anal carcinoma.  10 mm hypermetabolic left presacral lymph node, consistent with lymph node metastatic disease.  No other sites of metastatic disease was  identified.  cT2,cN1,cM0, Stage II B disease.   We will proceed with concurrent chemoradiation using 5-FU/mitomycin using Nigro protocol.  Treatments to begin on 02/14/2024. Chemotherapy will be given via PICC line on days 1-4 and days 29-32. We will monitor for treatment side effects such as nausea, diarrhea, low blood counts, fatigue, altered taste, and hair thinning, and manage accordingly.  Labs today reveal no dose-limiting toxicities.  Will proceed with chemotherapy as planned on 02/14/2024.  I will plan to see her in 1 week, when she comes for 5-FU pump removal.  Additional supportive care will be arranged as needed.   Constipation Constipation likely due to dietary changes and anxiety. Currently on a soft diet post-colonoscopy, contributing to low fiber intake. Miralax is not currently effective. Hydration is adequate but inconsistent. - Continue Miralax once daily, increase to twice daily if needed - Start Senokot at night - Maintain adequate hydration - Encourage physical activity such as yoga  Anxiety disorder Experiencing anxiety contributing to constipation and insomnia. On Lexapro for a week, which may take time to show full effects. Atarax used occasionally with mixed results. Considering Ativan for acute anxiety relief. - Prescribe Ativan 0.5 mg, 15 tablets, to be taken as needed for anxiety - Continue Lexapro as prescribed - Encourage relaxation techniques  Insomnia Difficulty sleeping likely related to anxiety and upcoming treatment. Tried Unisom and Atarax with limited success. Open to trying Ativan for sleep if needed. - Prescribe Ativan 0.5 mg, 15 tablets, to be taken as needed for sleep - Continue using Unisom as needed  I reviewed lab results and outside records for this visit and discussed relevant results with the patient. Diagnosis, plan of care and treatment options were  also discussed in detail with the patient. Opportunity provided to ask questions and answers  provided to her apparent satisfaction. Provided instructions to call our clinic with any problems, questions or concerns prior to return visit. I recommended to continue follow-up with PCP and sub-specialists. She verbalized understanding and agreed with the plan.   NCCN guidelines have been consulted in the planning of this patient's care.  I spent a total of 40 minutes during this encounter with the patient including review of chart and various tests results, discussions about plan of care and coordination of care plan.   Chinita Patten, MD  02/11/2024 5:08 PM  [facility] - A DEPT OF [facility] 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  History of Present Illness Katrina [name] is a 68 year old female who presents with constipation and preparation for upcoming chemotherapy and radiation treatment. Referred by Dr. Maritza for radiation therapy scheduling.  She has been experiencing constipation for the past day and a half, which is unusual for her. She has taken two and a half doses of Miralax without relief. She describes an upset stomach and discomfort due to lack of bowel movement. No abdominal pain. No use of pain medications like oxycodone or tramadol .  She is preparing to start chemotherapy and radiation treatment on Monday. A PICC line was placed this morning. There was initial confusion regarding the scheduling of her radiation therapy, but it was resolved to start on Monday to align with her chemotherapy schedule.  She has been on a soft diet  following a recent colonoscopy, which has limited her fiber intake. She is trying to maintain hydration by drinking at least 64 ounces of water daily, although she has had less today due to being busy. She also reports not sleeping well last night, attributing it to nerves.  She is currently taking Lexapro, which she started a week ago, and has continued despite experiencing an upset stomach earlier in the week. She has also taken Atarax to help with sleep and anxiety, which was somewhat effective.  She mentions a family history of similar gastrointestinal issues, noting that her father and brother also experience nervous system-related digestive problems.     I have reviewed the past medical history, past surgical history, social history and family history with the patient and they are unchanged from previous note.  HISTORY OF PRESENT ILLNESS:   ONCOLOGY HISTORY:   Patient presented to her PCP on 11/18/2023 with complaints of rectal itching for couple of weeks and a sensation of incomplete bowel movement, associated with occasional bleeding during/after a bowel movement.  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  Otherwise unremarkable colonoscopy.  On rectal exam in the clinic, similar findings were noted with nonbleeding internal hemorrhoids with additional findings of perirectal erythema and internal erythema but no bright red blood was visualized.  Prescription sent for Proctofoam-HC twice daily.  Apparently she did not get benefit from this medication.   She was referred to GI for  further evaluation.   She had consultation with Dr. Federico on 01/11/2024.  She reported that she has been noticing bleeding with every bowel movement.  Also reported some stool leakage on occasion.  On physical exam in the clinic, palpation of the inside of the anus revealed what felt like a posterior anal  fissure and significant surrounding induration in that area.  Anoscopy revealed friable indurated mucosa, internal hemorrhoids and was limited by patient discomfort.  Recommendation made for urgent colonoscopy.   On 01/18/2024, colonoscopy was performed by Dr. Shila. An infiltrative and ulcerated partially obstructing mass was found in the distal rectum.  The mass was partially circumferential (involving two thirds of the lumen circumference).  The mass measured 4 cm in length.  Oozing was present.  This was biopsied with a cold forceps for histology.  Exam was otherwise normal throughout the examined colon.   Pathology from the distal rectal lesion came back positive for invasive moderately differentiated squamous cell carcinoma.  Immunostains were not obtained.   CT abdomen and pelvis on 01/19/2024 showed irregularity of the rectal wall with an ill-defined 1.5 x 2 cm hypodense lesion, in keeping with known rectal mass. 9 mm lymph node in the posterior pelvis adjacent to the rectosigmoid, concerning for local lymph node involvement.  No evidence of distant metastasis in the abdomen or pelvis.   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   On 02/02/2024, staging PET scan showed intense hypermetabolic activity throughout the anus, consistent with known primary anal carcinoma.  10 mm hypermetabolic left presacral lymph node, consistent with lymph node metastatic disease.  No other sites of metastatic disease was identified.  cT2,cN1,cM0, Stage II B disease.   We will proceed with concurrent chemoradiation using 5-FU/mitomycin using Nigro protocol.  Treatments to begin on 02/14/2024.  Oncology History  Malignant neoplasm of overlapping sites of rectum, anus and anal canal (HCC)  01/27/2024 Initial Diagnosis   Primary squamous cell carcinoma of anal canal   01/27/2024 Cancer Staging   Staging form: Anus, AJCC V9 -  Clinical: Stage IIB (cT2, cN1a, cM0) - Signed by Autumn Millman, MD on 01/27/2024 Method of lymph node assessment: Clinical   02/14/2024 -  Chemotherapy   Patient is on Treatment Plan : ANUS Mitomycin D1,28 + 5FU D1-4, 28-31 q32d         REVIEW OF SYSTEMS:   Review of Systems - Oncology  All other pertinent systems were reviewed with the patient and are negative.  ALLERGIES: She has no known allergies.  MEDICATIONS:  Current Outpatient Medications  Medication Sig Dispense Refill   LORazepam (ATIVAN) 0.5 MG tablet Take 1 tablet (0.5 mg total) by mouth at bedtime. 15 tablet 0   Adalimumab (HUMIRA PEN) 40 MG/0.4ML PNKT      Ascorbic Acid (VITAMIN C) 1000 MG tablet Take 1,000 mg by mouth daily.     calcium carbonate (OS-CAL) 600 MG TABS Take 600 mg by mouth daily.     cholecalciferol (VITAMIN D ) 1000 UNITS tablet Take by mouth daily.     clonazePAM  (KLONOPIN ) 0.1 mg/mL SUSP Take 5 mLs (0.5 mg total) by mouth 2 (two) times daily as needed. 300 mL 1   denosumab  (PROLIA ) 60 MG/ML SOSY injection Inject 60 mg into the skin every 6 (six) months.     escitalopram (LEXAPRO) 10 MG tablet Take 1 tablet (10 mg total) by mouth daily. 30 tablet 11   folic acid  (FOLVITE )  1 MG tablet Take 2 mg by mouth daily.     hydrOXYzine (ATARAX) 10 MG tablet Take 1 tablet (10 mg total) by mouth 3 (three) times daily as needed for anxiety. 30 tablet 0   meloxicam (MOBIC) 15 MG tablet Take 1 tablet by mouth as needed.     methotrexate (RHEUMATREX) 2.5 MG tablet Take 15 mg by mouth once a week. Caution:Chemotherapy. Protect from light.  PRN     NON FORMULARY Diltiazem 2%/Lidocaine5% compound Apply to rectum 3 times daily for 8 weeks 30 g 1   ondansetron  (ZOFRAN ) 8 MG tablet Take 1 tablet (8 mg total) by mouth every 8 (eight) hours as needed for nausea or vomiting. 30 tablet 1   oxyCODONE (OXY IR/ROXICODONE) 5 MG immediate release tablet Take 1 tablet (5 mg total) by mouth every 4 (four) hours as needed for severe  pain (pain score 7-10). 60 tablet 0   prochlorperazine  (COMPAZINE ) 10 MG tablet Take 1 tablet (10 mg total) by mouth every 6 (six) hours as needed for nausea or vomiting. 30 tablet 1   traMADol  (ULTRAM ) 50 MG tablet Take 1 tablet (50 mg total) by mouth every 6 (six) hours as needed. 30 tablet 0   No current facility-administered medications for this visit.     VITALS:   Blood pressure 118/76, pulse 86, temperature 97.7 F (36.5 C), temperature source Temporal, resp. rate 18, height 5' 2 (1.575 m), weight 129 lb 6.4 oz (58.7 kg), last menstrual period 05/04/2002, SpO2 96%.  Wt Readings from Last 3 Encounters:  02/11/24 129 lb 6.4 oz (58.7 kg)  01/31/24 (P) 127 lb 8 oz (57.8 kg)  01/27/24 126 lb 9.6 oz (57.4 kg)    Body mass index is 23.67 kg/m.    Onc Performance Status - 02/11/24 1600       ECOG Perf Status   ECOG Perf Status Fully active, able to carry on all pre-disease performance without restriction      KPS SCALE   KPS % SCORE Normal, no compliants, no evidence of disease          PHYSICAL EXAM:   Physical Exam Constitutional:      General: She is not in acute distress.    Appearance: Normal appearance.  HENT:     Head: Normocephalic and atraumatic.  Cardiovascular:     Rate and Rhythm: Normal rate.  Pulmonary:     Effort: Pulmonary effort is normal. No respiratory distress.  Abdominal:     General: There is no distension.  Musculoskeletal:     Comments: Right arm PICC line in place  Neurological:     General: No focal deficit present.     Mental Status: She is alert and oriented to person, place, and time.  Psychiatric:        Mood and Affect: Mood normal.        Behavior: Behavior normal.       LABORATORY DATA:   I have reviewed the data as listed.  Results for orders placed or performed in visit on 02/11/24  CBC with Differential (Cancer Center Only)  Result Value Ref Range   WBC Count 12.9 (H) 4.0 - 10.5 K/uL   RBC 4.38 3.87 - 5.11 MIL/uL    Hemoglobin 14.1 12.0 - 15.0 g/dL   HCT 59.2 63.9 - 53.9 %   MCV 92.9 80.0 - 100.0 fL   MCH 32.2 26.0 - 34.0 pg   MCHC 34.6 30.0 - 36.0 g/dL   RDW 12.9  11.5 - 15.5 %   Platelet Count 323 150 - 400 K/uL   nRBC 0.0 0.0 - 0.2 %   Neutrophils Relative % 70 %   Neutro Abs 9.2 (H) 1.7 - 7.7 K/uL   Lymphocytes Relative 17 %   Lymphs Abs 2.2 0.7 - 4.0 K/uL   Monocytes Relative 11 %   Monocytes Absolute 1.4 (H) 0.1 - 1.0 K/uL   Eosinophils Relative 1 %   Eosinophils Absolute 0.1 0.0 - 0.5 K/uL   Basophils Relative 1 %   Basophils Absolute 0.1 0.0 - 0.1 K/uL   Immature Granulocytes 0 %   Abs Immature Granulocytes 0.04 0.00 - 0.07 K/uL  CMP (Cancer Center only)  Result Value Ref Range   Sodium 136 135 - 145 mmol/L   Potassium 3.9 3.5 - 5.1 mmol/L   Chloride 101 98 - 111 mmol/L   CO2 27 22 - 32 mmol/L   Glucose, Bld 112 (H) 70 - 99 mg/dL   BUN 16 8 - 23 mg/dL   Creatinine 9.45 9.55 - 1.00 mg/dL   Calcium 9.5 8.9 - 89.6 mg/dL   Total Protein 7.2 6.5 - 8.1 g/dL   Albumin 4.2 3.5 - 5.0 g/dL   AST 26 15 - 41 U/L   ALT 14 0 - 44 U/L   Alkaline Phosphatase 57 38 - 126 U/L   Total Bilirubin 0.4 0.0 - 1.2 mg/dL   GFR, Estimated >39 >39 mL/min   Anion gap 9 5 - 15      RADIOGRAPHIC STUDIES:  I have personally reviewed the radiological images as listed and agree with the findings in the report.  IR PICC PLACEMENT RIGHT <5 YRS INC IMG GUIDE Result Date: 02/11/2024 INDICATION: 68 year old patient presents with history of anal cancer. Received request for placement of peripherally inserted central catheter for chemotherapy treatment. EXAM: RIGHT UPPER EXTREMITY PICC LINE PLACEMENT WITH ULTRASOUND AND FLUOROSCOPIC GUIDANCE MEDICATIONS: 5 mL 1% lidocaine ANESTHESIA/SEDATION: None. FLUOROSCOPY: Radiation Exposure Index (as provided by the fluoroscopic device): 1 mGy Kerma COMPLICATIONS: None immediate. PROCEDURE: The patient was advised of the possible risks and complications and agreed to  undergo the procedure. The patient was then brought to the angiographic suite for the procedure. The right arm was prepped with chlorhexidine, draped in the usual sterile fashion using maximum barrier technique (cap and mask, sterile gown, sterile gloves, large sterile sheet, hand hygiene and cutaneous antisepsis) and infiltrated locally with 1% Lidocaine. Ultrasound demonstrated patency of the right basilic vein, and this was documented with an image. Under real-time ultrasound guidance, this vein was accessed with a 21 gauge micropuncture needle and image documentation was performed. A 0.018 wire was introduced in to the vein. Over this, a 5 [place] double lumen power injectable PICC was advanced to the lower SVC/right atrial junction. Fluoroscopy during the procedure and fluoro spot radiograph confirms appropriate catheter position. The catheter was flushed and covered with a sterile dressing. Catheter length: 39 cm IMPRESSION: Successful right arm power injectable PICC line placement with ultrasound and fluoroscopic guidance. The catheter is ready for use. Performed by: Kristi Davenport, NP / Juliene Balder, MD Electronically Signed   By: Juliene Balder M.D.   On: 02/11/2024 10:15   NM PET Image Initial (PI) Skull Base To Thigh Result Date: 02/03/2024 CLINICAL DATA:  Initial treatment strategy for anal squamous cell carcinoma. EXAM: NUCLEAR MEDICINE PET SKULL BASE TO THIGH TECHNIQUE: 6.4 mCi F-18 FDG was injected intravenously. Full-ring PET imaging was performed from the skull  base to thigh after the radiotracer. CT data was obtained and used for attenuation correction and anatomic localization. Fasting blood glucose: 95 mg/dl COMPARISON:  CT on 90/82/7974 FINDINGS: Mediastinal blood-pool activity (background): SUV max = 2.6 Liver activity (reference): SUV max = N/A NECK:  No hypermetabolic lymph nodes or masses. Incidental CT findings:  None. CHEST: No hypermetabolic lymph nodes. No suspicious pulmonary nodules seen  on CT images. Incidental CT findings:  None. ABDOMEN/PELVIS: Intense hypermetabolic activity is seen throughout the anus, with SUV max of 16.8. This corresponds to the known primary anal carcinoma. 8 x 10 mm left presacral lymph node is seen near the rectosigmoid junction which shows intense hypermetabolism, with SUV max of 7.6. No other hypermetabolic lymph nodes are seen within the pelvis or abdomen. No abnormal hypermetabolic activity within the liver, pancreas, adrenal glands, or spleen. Incidental CT findings:  1.5 cm calcified uterine fibroid. SKELETON: No focal hypermetabolic bone lesions to suggest skeletal metastasis. Incidental CT findings:  None. IMPRESSION: Intense hypermetabolic activity throughout the anus, consistent with known primary anal carcinoma. 10 mm hypermetabolic left presacral lymph node, consistent with metastatic disease. No other sites of metastatic disease identified. Electronically Signed   By: Norleen DELENA Kil M.D.   On: 02/03/2024 09:28   CT ABDOMEN PELVIS W CONTRAST Result Date: 01/19/2024 CLINICAL DATA:  Rectal mass.  Rule out metastasis. EXAM: CT ABDOMEN AND PELVIS WITH CONTRAST TECHNIQUE: Multidetector CT imaging of the abdomen and pelvis was performed using the standard protocol following bolus administration of intravenous contrast. RADIATION DOSE REDUCTION: This exam was performed according to the departmental dose-optimization program which includes automated exposure control, adjustment of the mA and/or kV according to patient size and/or use of iterative reconstruction technique. CONTRAST:  75mL OMNIPAQUE  IOHEXOL  300 MG/ML  SOLN COMPARISON:  None Available. FINDINGS: Lower chest: The visualized lung bases are clear. No intra-abdominal free air or free fluid. Hepatobiliary: The liver is unremarkable. No biliary ductal dilatation. The gallbladder is unremarkable. Pancreas: Unremarkable. No pancreatic ductal dilatation or surrounding inflammatory changes. Spleen: Normal in size  without focal abnormality. Adrenals/Urinary Tract: The adrenal glands unremarkable. There is no hydronephrosis on either side. There is symmetric enhancement and excretion of contrast by both kidneys. The visualized ureters and urinary bladder appear unremarkable. Stomach/Bowel: Irregularity of the rectal wall with an ill-defined 1.5 x 2.0 cm hypodense lesion in keeping with known rectal mass. There is no bowel obstruction or active inflammation. Appendectomy. Vascular/Lymphatic: The abdominal aorta and IVC unremarkable. No portal venous gas. Top-normal lymph node in the posterior pelvis adjacent to the rectosigmoid measures approximately 9 mm in diameter (65/301 and coronal 44/601). This is concerning for local lymph node involvement. There is no adenopathy. Reproductive: Small calcified uterine fibroid. No suspicious adnexal masses. Other: None Musculoskeletal: No acute osseous pathology. IMPRESSION: 1. Irregularity of the rectal wall with an ill-defined 1.5 x 2.0 cm hypodense lesion in keeping with known rectal mass. 2. Top-normal lymph node in the posterior pelvis adjacent to the rectosigmoid concerning for local lymph node involvement. 3. No evidence of distant metastasis in the abdomen or pelvis. Electronically Signed   By: Vanetta Chou M.D.   On: 01/19/2024 14:55    CODE STATUS:   No orders of the defined types were placed in this encounter.    Future Appointments  Date Time Provider Department Center  02/14/2024  1:45 PM Maritza Stagger, MD [facility] None  02/14/2024  3:00 PM [facility] INFUSION [facility] None  02/15/2024  1:30 PM [facility] LINAC 3 [facility] None  02/16/2024  1:45 PM Dinger, Montie CROME, RD [facility] None  02/16/2024  2:30 PM [facility] LINAC 4 [facility] None  02/17/2024  2:15 PM [facility] [id number] [facility] None  02/18/2024  1:30 PM [facility] LINAC 3 [facility] None  02/21/2024  2:45 PM [facility] [id number] [facility] None  02/22/2024  1:30 PM  [facility] [id number] [facility] None  02/23/2024  1:30 PM [facility] [id number] [facility] None  02/24/2024  1:30 PM [facility] [id number] [facility] None  02/25/2024  1:30 PM [facility] [id number] [facility] None  02/25/2024  1:45 PM Ivonne Harlene RAMAN, RD [facility] None  02/28/2024  1:30 PM [facility] [id number] [facility] None  02/29/2024  1:30 PM [facility] [id number] [facility] None  03/01/2024  1:00 PM [facility] [id number] [facility] None  03/02/2024  1:00 PM [facility] [id number] [facility] None  03/03/2024  1:00 PM [facility] [id number] [facility] None  03/06/2024  1:00 PM [facility] [id number] [facility] None  03/07/2024  1:00 PM [facility] [id number] [facility] None  03/08/2024  1:00 PM [facility] [id number] [facility] None  03/09/2024  1:15 PM [facility] [id number] [facility] None  03/10/2024  1:15 PM [facility] [id number] [facility] None  03/13/2024  1:15 PM [facility] [id number] [facility] None  03/14/2024  1:15 PM [facility] [id number] [facility] None  03/15/2024  1:15 PM [facility] [id number] [facility] None  03/16/2024  1:15 PM [facility] [id number] [facility] None  03/17/2024  1:15 PM [facility] [id number] [facility] None  03/20/2024  1:15 PM [facility] [id number] [facility] None  03/21/2024  1:15 PM Maritza Stagger, MD [facility] None  03/22/2024  1:15 PM [facility] LINAC 4 [facility] None  03/23/2024  1:15 PM [facility] LINAC 4 [facility] None  03/24/2024  1:15 PM [facility] LINAC 4 [facility] None  04/05/2024 11:00 AM [facility] None  04/06/2024  2:00 PM Helmus, Jitka, PT [facility] None  04/13/2024  2:00 PM Helmus, Jitka, PT [facility] None  04/20/2024  2:00 PM Helmus, Jitka, PT [facility] None  04/24/2024  2:00 PM Helmus, Jitka, PT [facility] None  05/08/2024  2:00 PM Helmus, Jitka, PT [facility] None  06/08/2024  9:10 AM [facility] [address]  06/12/2024  3:00 PM Baxley, Ronal PARAS, MD [facility] [address]      This document was completed  utilizing speech recognition software. Grammatical errors, random word insertions, pronoun errors, and incomplete sentences are an occasional consequence of this system due to software limitations, ambient noise, and hardware issues. Any formal questions or concerns about the content, text or information contained within the body of this dictation should be directly addressed to the provider for clarification.

## 2024-02-11 NOTE — Assessment & Plan Note (Addendum)
 Please review oncology history for additional details and timeline of events.  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 Biopsies confirm squamous cell carcinoma.   CT abdomen and pelvis on 01/19/2024 showed irregularity of the rectal wall with an ill-defined 1.5 x 2 cm hypodense lesion, in keeping with known rectal mass. 9 mm lymph node in the posterior pelvis adjacent to the rectosigmoid, concerning for local lymph node involvement.  No evidence of distant metastasis in the abdomen or pelvis.  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    On 02/02/2024, staging PET scan showed intense hypermetabolic activity throughout the anus, consistent with known primary anal carcinoma.  10 mm hypermetabolic left presacral lymph node, consistent with lymph node metastatic disease.  No other sites of metastatic disease was identified.  cT2,cN1,cM0, Stage II B disease.   We will proceed with concurrent chemoradiation using 5-FU/mitomycin using Nigro protocol.  Treatments to begin on 02/14/2024. Chemotherapy will be given via PICC line on days 1-4 and days 29-32. We will monitor for treatment side effects such as nausea, diarrhea, low blood counts, fatigue, altered taste, and hair thinning, and manage accordingly.  Labs today reveal no dose-limiting toxicities.  Will proceed with chemotherapy as planned on 02/14/2024.  I will plan to see her in 1 week, when she comes for 5-FU pump removal.  Additional supportive care will be arranged as needed.

## 2024-02-14 ENCOUNTER — Other Ambulatory Visit: Payer: Self-pay

## 2024-02-14 ENCOUNTER — Encounter

## 2024-02-14 ENCOUNTER — Inpatient Hospital Stay

## 2024-02-14 ENCOUNTER — Encounter: Payer: Self-pay | Admitting: Oncology

## 2024-02-14 ENCOUNTER — Ambulatory Visit
Admission: RE | Admit: 2024-02-14 | Discharge: 2024-02-14 | Disposition: A | Discharge status: Home / Self Care | Source: Ambulatory Visit | Attending: Radiation Oncology | Admitting: Radiation Oncology

## 2024-02-14 ENCOUNTER — Telehealth: Payer: Self-pay | Admitting: Oncology

## 2024-02-14 VITALS — BP 128/81 | HR 88 | Temp 98.2°F | Resp 19

## 2024-02-14 DIAGNOSIS — C218 Malignant neoplasm of overlapping sites of rectum, anus and anal canal: Secondary | ICD-10-CM

## 2024-02-14 DIAGNOSIS — Z510 Encounter for antineoplastic radiation therapy: Secondary | ICD-10-CM | POA: Diagnosis not present

## 2024-02-14 DIAGNOSIS — Z5111 Encounter for antineoplastic chemotherapy: Secondary | ICD-10-CM | POA: Diagnosis not present

## 2024-02-14 LAB — RAD ONC ARIA SESSION SUMMARY
Course Elapsed Days: 0
Plan Fractions Treated to Date: 1
Plan Prescribed Dose Per Fraction: 1.8 Gy
Plan Total Fractions Prescribed: 30
Plan Total Prescribed Dose: 54 Gy
Reference Point Dosage Given to Date: 1.8 Gy
Reference Point Session Dosage Given: 1.8 Gy
Session Number: 1

## 2024-02-14 MED ORDER — SODIUM CHLORIDE 0.9 % IV SOLN
1000.0000 mg/m2/d | INTRAVENOUS | Status: DC
  Administered 2024-02-14: 7000 mg via INTRAVENOUS
  Filled 2024-02-14: qty 140

## 2024-02-14 MED ORDER — SODIUM CHLORIDE 0.9 % IV SOLN: INTRAVENOUS | Status: DC

## 2024-02-14 MED ORDER — MITOMYCIN CHEMO IV INJECTION 20 MG
10.0000 mg/m2 | Freq: Once | INTRAVENOUS | Status: AC
  Administered 2024-02-14: 16 mg via INTRAVENOUS
  Filled 2024-02-14: qty 32

## 2024-02-14 MED ORDER — PROCHLORPERAZINE MALEATE 10 MG PO TABS
10.0000 mg | ORAL_TABLET | Freq: Once | ORAL | Status: AC
  Administered 2024-02-14: 10 mg via ORAL
  Filled 2024-02-14: qty 1

## 2024-02-14 NOTE — Patient Instructions (Signed)
 [facility] - A DEPT OF [facility]  Discharge Instructions: Thank you for choosing [facility] to provide your oncology and hematology care.   If you have a lab appointment with the Cancer Center, please go directly to the Cancer Center and check in at the registration area.   Wear comfortable clothing and clothing appropriate for easy access to any Portacath or PICC line.   We strive to give you quality time with your provider. You may need to reschedule your appointment if you arrive late (15 or more minutes).  Arriving late affects you and other patients whose appointments are after yours.  Also, if you miss three or more appointments without notifying the office, you may be dismissed from the clinic at the provider's discretion.      For prescription refill requests, have your pharmacy contact our office and allow 72 hours for refills to be completed.    Today you received the following chemotherapy and/or immunotherapy agents: fluorouracil and mitoMYcin (MUTAMYCIN) chemo injection 16 mg    To help prevent nausea and vomiting after your treatment, we encourage you to take your nausea medication as directed.  BELOW ARE SYMPTOMS THAT SHOULD BE REPORTED IMMEDIATELY: *FEVER GREATER THAN 100.4 F (38 C) OR HIGHER *CHILLS OR SWEATING *NAUSEA AND VOMITING THAT IS NOT CONTROLLED WITH YOUR NAUSEA MEDICATION *UNUSUAL SHORTNESS OF BREATH *UNUSUAL BRUISING OR BLEEDING *URINARY PROBLEMS (pain or burning when urinating, or frequent urination) *BOWEL PROBLEMS (unusual diarrhea, constipation, pain near the anus) TENDERNESS IN MOUTH AND THROAT WITH OR WITHOUT PRESENCE OF ULCERS (sore throat, sores in mouth, or a toothache) UNUSUAL RASH, SWELLING OR PAIN  UNUSUAL VAGINAL DISCHARGE OR ITCHING   Items with * indicate a potential emergency and should be followed up as soon as possible or go to the Emergency Department if any problems should occur.  Please  show the CHEMOTHERAPY ALERT CARD or IMMUNOTHERAPY ALERT CARD at check-in to the Emergency Department and triage nurse.  Should you have questions after your visit or need to cancel or reschedule your appointment, please contact [facility] - A DEPT OF [facility]  Dept: [phone]  and follow the prompts.  Office hours are 8:00 a.m. to 4:30 p.m. Monday - Friday. Please note that voicemails left after 4:00 p.m. may not be returned until the following business day.  We are closed weekends and major holidays. You have access to a nurse at all times for urgent questions. Please call the main number to the clinic Dept: [phone] and follow the prompts.   For any non-urgent questions, you may also contact your provider using MyChart. We now offer e-Visits for anyone 2 and older to request care online for non-urgent symptoms. For details visit [email or web address].   Also download the MyChart app! Go to the app store, search MyChart, open the app, select [facility], and log in with your MyChart username and password.  Mitomycin Injection What is this medication? MITOMYCIN (mye toe MYE sin) treats stomach cancer and pancreatic cancer. It works by slowing down the growth of cancer cells. This medicine may be used for other purposes; ask your health care provider or pharmacist if you have questions. COMMON BRAND NAME(S): Mutamycin What should I tell my care team before I take this medication? They need to know if you have any of these conditions: Bleeding disorders Infection, such as chickenpox, cold sores, herpes Low blood counts, such as  low white cells, platelets, red blood cells Kidney disease An unusual or allergic reaction to mitomycin, other medications, foods, dyes, or preservatives Pregnant or trying to get pregnant Breastfeeding How should I use this medication? This medication is injected into a vein. It is given by your care team in a hospital or  clinic setting. Talk to your care team about the use of this medication in children. Special care may be needed. Overdosage: If you think you have taken too much of this medicine contact a poison control center or emergency room at once. NOTE: This medicine is only for you. Do not share this medicine with others. What if I miss a dose? Keep appointments for follow-up doses. It is important not to miss your dose. Call your care team if you are unable to keep an appointment. What may interact with this medication? Interactions are not expected. This list may not describe all possible interactions. Give your health care provider a list of all the medicines, herbs, non-prescription drugs, or dietary supplements you use. Also tell them if you smoke, drink alcohol, or use illegal drugs. Some items may interact with your medicine. What should I watch for while using this medication? Your condition will be monitored carefully while you are receiving this medication. You may need blood work while taking this medication. This medication may make you feel generally unwell. This is not uncommon as chemotherapy can affect healthy cells as well as cancer cells. Report any side effects. Continue your course of treatment even though you feel ill unless your care team tells you to stop. This medication may increase your risk of getting an infection. Call your care team for advice if you get a fever, chills, sore throat, or other symptoms of a cold or flu. Do not treat yourself. Try to avoid being around people who are sick. Avoid taking medications that contain aspirin, acetaminophen , ibuprofen, naproxen, or ketoprofen unless instructed by your care team. These medications may hide a fever. This medication may increase your risk to bruise or bleed. Call your care team if you notice any unusual bleeding. Be careful brushing or flossing your teeth or using a toothpick because you may get an infection or bleed more  easily. If you have any dental work done, tell your dentist you are receiving this medication. Talk to your care team if you may be pregnant. Serious birth defects can occur if you take this medication during pregnancy. Contraception is recommended while taking this medication. Your care team can help you find the option that works for you. Do not breastfeed while taking this medication. What side effects may I notice from receiving this medication? Side effects that you should report to your care team as soon as possible: Allergic reactions--skin rash, itching, hives, swelling of the face, lips, tongue, or throat Dry cough, shortness of breath or trouble breathing Infection--fever, chills, cough, sore throat, wounds that don't heal, pain or trouble when passing urine, general feeling of discomfort or being unwell Kidney injury--decrease in the amount of urine, swelling of the ankles, hands, or feet Low red blood cell level--unusual weakness or fatigue, dizziness, headache, trouble breathing Stomach pain, bloody diarrhea, pale skin, unusual weakness or fatigue, decrease in the amount of urine, which may be signs of hemolytic uremic syndrome Unusual bruising or bleeding Side effects that usually do not require medical attention (report these to your care team if they continue or are bothersome): Diarrhea Hair loss Loss of appetite with weight loss Nausea Pain, redness,  or swelling with sores inside the mouth or throat This list may not describe all possible side effects. Call your doctor for medical advice about side effects. You may report side effects to FDA at 1-800-FDA-1088. Where should I keep my medication? This medication is given in a hospital or clinic. It will not be stored at home. NOTE: This sheet is a summary. It may not cover all possible information. If you have questions about this medicine, talk to your doctor, pharmacist, or health care provider.  2024 Elsevier/Gold Standard  (2021-09-11 00:00:00) Fluorouracil Injection What is this medication? FLUOROURACIL (flure oh YOOR a sil) treats some types of cancer. It works by slowing down the growth of cancer cells. This medicine may be used for other purposes; ask your health care provider or pharmacist if you have questions. COMMON BRAND NAME(S): Adrucil What should I tell my care team before I take this medication? They need to know if you have any of these conditions: Blood disorders Dihydropyrimidine dehydrogenase (DPD) deficiency Infection, such as chickenpox, cold sores, herpes Kidney disease Liver disease Poor nutrition Recent or ongoing radiation therapy An unusual or allergic reaction to fluorouracil, other medications, foods, dyes, or preservatives If you or your partner are pregnant or trying to get pregnant Breast-feeding How should I use this medication? This medication is injected into a vein. It is administered by your care team in a hospital or clinic setting. Talk to your care team about the use of this medication in children. Special care may be needed. Overdosage: If you think you have taken too much of this medicine contact a poison control center or emergency room at once. NOTE: This medicine is only for you. Do not share this medicine with others. What if I miss a dose? Keep appointments for follow-up doses. It is important not to miss your dose. Call your care team if you are unable to keep an appointment. What may interact with this medication? Do not take this medication with any of the following: Live virus vaccines This medication may also interact with the following: Medications that treat or prevent blood clots, such as warfarin, enoxaparin, dalteparin This list may not describe all possible interactions. Give your health care provider a list of all the medicines, herbs, non-prescription drugs, or dietary supplements you use. Also tell them if you smoke, drink alcohol, or use illegal  drugs. Some items may interact with your medicine. What should I watch for while using this medication? Your condition will be monitored carefully while you are receiving this medication. This medication may make you feel generally unwell. This is not uncommon as chemotherapy can affect healthy cells as well as cancer cells. Report any side effects. Continue your course of treatment even though you feel ill unless your care team tells you to stop. In some cases, you may be given additional medications to help with side effects. Follow all directions for their use. This medication may increase your risk of getting an infection. Call your care team for advice if you get a fever, chills, sore throat, or other symptoms of a cold or flu. Do not treat yourself. Try to avoid being around people who are sick. This medication may increase your risk to bruise or bleed. Call your care team if you notice any unusual bleeding. Be careful brushing or flossing your teeth or using a toothpick because you may get an infection or bleed more easily. If you have any dental work done, tell your dentist you are receiving this  medication. Avoid taking medications that contain aspirin, acetaminophen , ibuprofen, naproxen, or ketoprofen unless instructed by your care team. These medications may hide a fever. Do not treat diarrhea with over the counter products. Contact your care team if you have diarrhea that lasts more than 2 days or if it is severe and watery. This medication can make you more sensitive to the sun. Keep out of the sun. If you cannot avoid being in the sun, wear protective clothing and sunscreen. Do not use sun lamps, tanning beds, or tanning booths. Talk to your care team if you or your partner wish to become pregnant or think you might be pregnant. This medication can cause serious birth defects if taken during pregnancy and for 3 months after the last dose. A reliable form of contraception is recommended while  taking this medication and for 3 months after the last dose. Talk to your care team about effective forms of contraception. Do not father a child while taking this medication and for 3 months after the last dose. Use a condom while having sex during this time period. Do not breastfeed while taking this medication. This medication may cause infertility. Talk to your care team if you are concerned about your fertility. What side effects may I notice from receiving this medication? Side effects that you should report to your care team as soon as possible: Allergic reactions--skin rash, itching, hives, swelling of the face, lips, tongue, or throat Heart attack--pain or tightness in the chest, shoulders, arms, or jaw, nausea, shortness of breath, cold or clammy skin, feeling faint or lightheaded Heart failure--shortness of breath, swelling of the ankles, feet, or hands, sudden weight gain, unusual weakness or fatigue Heart rhythm changes--fast or irregular heartbeat, dizziness, feeling faint or lightheaded, chest pain, trouble breathing High ammonia level--unusual weakness or fatigue, confusion, loss of appetite, nausea, vomiting, seizures Infection--fever, chills, cough, sore throat, wounds that don't heal, pain or trouble when passing urine, general feeling of discomfort or being unwell Low red blood cell level--unusual weakness or fatigue, dizziness, headache, trouble breathing Pain, tingling, or numbness in the hands or feet, muscle weakness, change in vision, confusion or trouble speaking, loss of balance or coordination, trouble walking, seizures Redness, swelling, and blistering of the skin over hands and feet Severe or prolonged diarrhea Unusual bruising or bleeding Side effects that usually do not require medical attention (report to your care team if they continue or are bothersome): Dry skin Headache Increased tears Nausea Pain, redness, or swelling with sores inside the mouth or  throat Sensitivity to light Vomiting This list may not describe all possible side effects. Call your doctor for medical advice about side effects. You may report side effects to FDA at 1-800-FDA-1088. Where should I keep my medication? This medication is given in a hospital or clinic. It will not be stored at home. NOTE: This sheet is a summary. It may not cover all possible information. If you have questions about this medicine, talk to your doctor, pharmacist, or health care provider.  2024 Elsevier/Gold Standard (2021-08-26 00:00:00)

## 2024-02-14 NOTE — Progress Notes (Unsigned)
 PATIENT NAVIGATOR PROGRESS NOTE  Name: Katrina [name] Date: 02/14/2024 MRN: [id number]  DOB: Sep 09, 1955   Reason for visit:  First day of Chemo/Radiation  Comments:   Patient was seen in infusion room during her first day of chemo/radiation.   PICC line orders have been placed for the start of her next treatment on 11/7. Message sent to schedule PICC line placement.   Patient is established with a treatment plan and is actively engaged in care. Nurse Navigator services not currently indicated at this time. Will re-evaluate if needs change or if additional support is requested.   Time spent counseling/coordinating care: 15-30 minutes

## 2024-02-14 NOTE — Telephone Encounter (Signed)
 Scheduled patient for labs and a follow-up this Friday 10/17. Called and left a voicemail with the appointment details.

## 2024-02-15 ENCOUNTER — Encounter: Payer: Self-pay | Admitting: Internal Medicine

## 2024-02-15 ENCOUNTER — Ambulatory Visit
Admission: RE | Admit: 2024-02-15 | Discharge: 2024-02-15 | Disposition: A | Source: Ambulatory Visit | Attending: Radiation Oncology

## 2024-02-15 ENCOUNTER — Other Ambulatory Visit: Payer: Self-pay | Admitting: Oncology

## 2024-02-15 ENCOUNTER — Encounter: Payer: Self-pay | Admitting: Oncology

## 2024-02-15 ENCOUNTER — Other Ambulatory Visit: Payer: Self-pay

## 2024-02-15 DIAGNOSIS — C218 Malignant neoplasm of overlapping sites of rectum, anus and anal canal: Secondary | ICD-10-CM

## 2024-02-15 DIAGNOSIS — Z510 Encounter for antineoplastic radiation therapy: Secondary | ICD-10-CM | POA: Diagnosis not present

## 2024-02-15 LAB — RAD ONC ARIA SESSION SUMMARY
Course Elapsed Days: 1
Plan Fractions Treated to Date: 2
Plan Prescribed Dose Per Fraction: 1.8 Gy
Plan Total Fractions Prescribed: 30
Plan Total Prescribed Dose: 54 Gy
Reference Point Dosage Given to Date: 3.6 Gy
Reference Point Session Dosage Given: 1.8 Gy
Session Number: 2

## 2024-02-16 ENCOUNTER — Inpatient Hospital Stay: Admitting: Dietician

## 2024-02-16 ENCOUNTER — Telehealth: Payer: Self-pay | Admitting: Dietician

## 2024-02-16 ENCOUNTER — Ambulatory Visit
Admission: RE | Admit: 2024-02-16 | Discharge: 2024-02-16 | Disposition: A | Discharge status: Home / Self Care | Source: Ambulatory Visit | Attending: Radiation Oncology | Admitting: Radiation Oncology

## 2024-02-16 ENCOUNTER — Other Ambulatory Visit: Payer: Self-pay

## 2024-02-16 ENCOUNTER — Ambulatory Visit

## 2024-02-16 DIAGNOSIS — Z510 Encounter for antineoplastic radiation therapy: Secondary | ICD-10-CM | POA: Diagnosis not present

## 2024-02-16 LAB — RAD ONC ARIA SESSION SUMMARY
Course Elapsed Days: 2
Plan Fractions Treated to Date: 3
Plan Prescribed Dose Per Fraction: 1.8 Gy
Plan Total Fractions Prescribed: 30
Plan Total Prescribed Dose: 54 Gy
Reference Point Dosage Given to Date: 5.4 Gy
Reference Point Session Dosage Given: 1.8 Gy
Session Number: 3

## 2024-02-16 NOTE — Telephone Encounter (Signed)
 Attempted to reach patient for a scheduled remote nutrition consult.   She has questions but has limited time today as she is scheduled for radiation soon.  Offered to reschedule tomorrow.  Prefers later appointments.  Rescheduled for 12:30pm tomorrow.  Also told me she is very queasy and the only thing that is sitting well is Premier Protein drinks.  I suggested she ask for samples of Ensure Complete when she goes to [facility] today.  She prefers vanilla as she has trouble sleeping and doesn't want to risk the caffeine in chocolate.  Offered to send tip sheet for nausea to email.   Provided my contact info in email.  Will follow up tomorrow during our remote nutrition consult.  Micheline Craven, RDN, LDN Registered Dietitian, [facility] Part Time Remote (Usual office hours: Tuesday-Thursday) Cell: [phone]

## 2024-02-17 ENCOUNTER — Inpatient Hospital Stay: Admitting: Dietician

## 2024-02-17 ENCOUNTER — Ambulatory Visit
Admission: RE | Admit: 2024-02-17 | Discharge: 2024-02-17 | Disposition: A | Discharge status: Home / Self Care | Source: Ambulatory Visit | Attending: Radiation Oncology | Admitting: Radiation Oncology

## 2024-02-17 ENCOUNTER — Other Ambulatory Visit: Payer: Self-pay

## 2024-02-17 DIAGNOSIS — Z510 Encounter for antineoplastic radiation therapy: Secondary | ICD-10-CM | POA: Diagnosis not present

## 2024-02-17 DIAGNOSIS — C218 Malignant neoplasm of overlapping sites of rectum, anus and anal canal: Secondary | ICD-10-CM | POA: Diagnosis not present

## 2024-02-17 LAB — RAD ONC ARIA SESSION SUMMARY
Course Elapsed Days: 3
Plan Fractions Treated to Date: 4
Plan Prescribed Dose Per Fraction: 1.8 Gy
Plan Total Fractions Prescribed: 30
Plan Total Prescribed Dose: 54 Gy
Reference Point Dosage Given to Date: 7.2 Gy
Reference Point Session Dosage Given: 1.8 Gy
Session Number: 4

## 2024-02-17 NOTE — Progress Notes (Signed)
 Nutrition Assessment: Reached out to patient at home telephone number.    Reason for Assessment: New Patient with questions   ASSESSMENT: Patient is a 68 y.o.  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  She has a past medical history of rheumatoid arthritis, burning mouth syndrome, osteoporosis and insomnia. Treatment will be concurrent chemoradiation using 5-FU/mitomycin using Nigro protocol,  She is followed by Dr. Autumn.  Patient endorses queasiness and was appreciative of the tip sheet emailed yesterday.  Intake today: oatmeal, Gingerale crackers Addressed her questions which involved:  Protein and Calorie requirements. 1800-2000 When to use electrolyte replacement products She relayed that her GI MD told her to restrict "dry chicken, steak, pork" and raw fruits and vegetables. Reported one episode of constipation and was concerned with how to get fiber intake up. NKFA She is fine with several meal/day pattern.  She is willing to d/c Vic C and enjoys oranges, tomatoes She tried Ensure Complete but doesn't enjoy as much as Premier protein.  Her fluid goal was water 64 oz. Craving cold fluids at this time.    Nutrition Focused Physical Exam: unable to perform NFPE   Medications:  Using Compazine , avoiding Zofran  as she is worried about constipation, Takes Vit D3, Folvite , calcium, and Vit C   Labs: 02/11/24 reviewed   Anthropometrics:  weight loss 5.6# (4%) past 6 months not clinically significant for timeframe  Height: 62 Weight: 129.4# UBW: 135# BMI: 23.67   Estimated Energy Needs  Kcals: 1800-2000 Protein: 71-89 g Fluid: 64-80 oz   NUTRITION DIAGNOSIS: Food and Nutrition Related Knowledge Deficit related to cancer and associated treatments as evidenced by no prior need for nutrition related information.   INTERVENTION:   Relayed that nutrition services are wrap around service provided at no  charge and encouraged continued communication if experiencing any nutritional impact symptoms (NIS). Educated on importance of adequate nourishment with calorie and protein energy intake with nutrient dense foods when possible to maintain weight/strength and QOL.   Educated on when to use electrolyte replacement drinks Encouraged soft moist high protein foods as well frequent feeds.  Encouraged ground meats as compromise to avoiding dry meats and poultry. Asked to d/c Vit C supplementation and increase with food and beverage sources. Discussed strategies for constipation   Contact information was provided in email yesterday with nausea tips.  MONITORING, EVALUATION, GOAL: weight trends, nutrition impact symptoms, PO intake, labs   Next Visit: Remote next week with Elvie Micheline Craven, RDN, LDN Registered Dietitian, [facility] Part Time Remote (Usual office hours: Tuesday-Thursday) Mobile: [phone]

## 2024-02-18 ENCOUNTER — Inpatient Hospital Stay

## 2024-02-18 ENCOUNTER — Other Ambulatory Visit: Payer: Self-pay

## 2024-02-18 ENCOUNTER — Encounter: Payer: Self-pay | Admitting: Oncology

## 2024-02-18 ENCOUNTER — Inpatient Hospital Stay: Admitting: Oncology

## 2024-02-18 ENCOUNTER — Ambulatory Visit
Admission: RE | Admit: 2024-02-18 | Discharge: 2024-02-18 | Disposition: A | Source: Ambulatory Visit | Attending: Radiation Oncology

## 2024-02-18 VITALS — BP 100/70 | HR 92 | Temp 98.7°F | Resp 17 | Ht 62.0 in | Wt 127.0 lb

## 2024-02-18 DIAGNOSIS — C218 Malignant neoplasm of overlapping sites of rectum, anus and anal canal: Secondary | ICD-10-CM

## 2024-02-18 DIAGNOSIS — C44520 Squamous cell carcinoma of anal skin: Secondary | ICD-10-CM | POA: Diagnosis not present

## 2024-02-18 DIAGNOSIS — Z510 Encounter for antineoplastic radiation therapy: Secondary | ICD-10-CM | POA: Diagnosis not present

## 2024-02-18 DIAGNOSIS — Z5111 Encounter for antineoplastic chemotherapy: Secondary | ICD-10-CM | POA: Diagnosis not present

## 2024-02-18 LAB — CMP (CANCER CENTER ONLY)
ALT: 14 U/L (ref 0–44)
AST: 17 U/L (ref 15–41)
Albumin: 3.8 g/dL (ref 3.5–5.0)
Alkaline Phosphatase: 52 U/L (ref 38–126)
Anion gap: 6 (ref 5–15)
BUN: 16 mg/dL (ref 8–23)
CO2: 29 mmol/L (ref 22–32)
Calcium: 9.3 mg/dL (ref 8.9–10.3)
Chloride: 100 mmol/L (ref 98–111)
Creatinine: 0.5 mg/dL (ref 0.44–1.00)
GFR, Estimated: >60 mL/min (ref >60)
Glucose, Bld: 98 mg/dL (ref 70–99)
Potassium: 3.8 mmol/L (ref 3.5–5.1)
Sodium: 135 mmol/L (ref 135–145)
Total Bilirubin: 1 mg/dL (ref 0.0–1.2)
Total Protein: 7.1 g/dL (ref 6.5–8.1)

## 2024-02-18 LAB — CBC WITH DIFFERENTIAL (CANCER CENTER ONLY)
Abs Immature Granulocytes: 0.03 K/uL (ref 0.00–0.07)
Basophils Absolute: 0.1 K/uL (ref 0.0–0.1)
Basophils Relative: 1 %
Eosinophils Absolute: 0.1 K/uL (ref 0.0–0.5)
Eosinophils Relative: 1 %
HCT: 40.3 % (ref 36.0–46.0)
Hemoglobin: 14 g/dL (ref 12.0–15.0)
Immature Granulocytes: 0 %
Lymphocytes Relative: 14 %
Lymphs Abs: 1.4 K/uL (ref 0.7–4.0)
MCH: 32.4 pg (ref 26.0–34.0)
MCHC: 34.7 g/dL (ref 30.0–36.0)
MCV: 93.3 fL (ref 80.0–100.0)
Monocytes Absolute: 0.3 K/uL (ref 0.1–1.0)
Monocytes Relative: 3 %
Neutro Abs: 8.2 K/uL — ABNORMAL HIGH (ref 1.7–7.7)
Neutrophils Relative %: 81 %
Platelet Count: 240 K/uL (ref 150–400)
RBC: 4.32 MIL/uL (ref 3.87–5.11)
RDW: 12.3 % (ref 11.5–15.5)
WBC Count: 10 K/uL (ref 4.0–10.5)
nRBC: 0 % (ref 0.0–0.2)

## 2024-02-18 LAB — RAD ONC ARIA SESSION SUMMARY
Course Elapsed Days: 4
Plan Fractions Treated to Date: 5
Plan Prescribed Dose Per Fraction: 1.8 Gy
Plan Total Fractions Prescribed: 30
Plan Total Prescribed Dose: 54 Gy
Reference Point Dosage Given to Date: 9 Gy
Reference Point Session Dosage Given: 1.8 Gy
Session Number: 5

## 2024-02-18 LAB — MAGNESIUM: Magnesium: 2.1 mg/dL (ref 1.7–2.4)

## 2024-02-18 NOTE — Assessment & Plan Note (Signed)
 Please review oncology history for additional details and timeline of events.  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 Biopsies confirm squamous cell carcinoma.   CT abdomen and pelvis on 01/19/2024 showed irregularity of the rectal wall with an ill-defined 1.5 x 2 cm hypodense lesion, in keeping with known rectal mass. 9 mm lymph node in the posterior pelvis adjacent to the rectosigmoid, concerning for local lymph node involvement.  No evidence of distant metastasis in the abdomen or pelvis.  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    On 02/02/2024, staging PET scan showed intense hypermetabolic activity throughout the anus, consistent with known primary anal carcinoma.  10 mm hypermetabolic left presacral lymph node, consistent with lymph node metastatic disease.  No other sites of metastatic disease was identified.  cT2,cN1,cM0, Stage II B disease.   We will proceed with concurrent chemoradiation using 5-FU/mitomycin using Nigro protocol.  Treatments began on 02/14/2024. Chemotherapy will be given via PICC line on days 1-4 and days 29-32. We will monitor for treatment side effects such as nausea, diarrhea, low blood counts, fatigue, altered taste, and hair thinning, and manage accordingly.  She tolerated the first cycle of 5-FU and mitomycin well.  Labs today reveal no dose-limiting toxicities.  Overall she is doing very well clinically. She reports improvement in pain from the tumor, indicating a positive response to the treatment.   Her PICC line will be removed later today and will need PICC line will be placed on 03/10/2024.  I will plan to see her on 03/10/2024 with repeat labs following which she will receive her next cycle of chemotherapy on 03/13/2024.  She will continue radiation treatments in the interim as scheduled.

## 2024-02-18 NOTE — Patient Instructions (Signed)
 PICC Removal, Adult, Care After The following information offers guidance on how to care for yourself after your procedure. Your health care provider may also give you more specific instructions. If you have problems or questions, contact your health care provider. What can I expect after the procedure? After the procedure, it is common to have: Tenderness or soreness. Redness, swelling, or a scab at the place where your PICC was removed (exit site). Follow these instructions at home: For the first 24 hours after the procedure: Keep the bandage (dressing) on your exit site clean and dry. Do not remove your dressing until your health care provider tells you to do so. Do not lift anything heavy or do activities that require great effort until your health care provider says it is okay. You should avoid: Lifting weights. Doing yard work. Doing any physical activity with repetitive arm movement. Watch closely for any signs of an air bubble in the vein (air embolism). This is a rare but serious complication. Signs of an air embolism include trouble breathing, wheezing, chest pain, or a fast pulse. If you have signs of an air embolism, call 911 right away and lie down on your left side to keep the air from moving into your lungs. After 24 hours have passed:  Remove your dressing as told by your health care provider. Wash your hands with soap and water for at least 20 seconds before and after you change your dressing. If soap and water are not available, use hand sanitizer. Return to your normal activities as told by your health care provider. A small scab may develop over the exit site. Do not pick at the scab. When bathing or showering, gently wash the exit site with soap and water. Pat it dry. Watch for signs of infection, such as: A fever or chills. Swollen glands under your arm. More redness, swelling, or soreness around your arm. Blood, fluid, or pus coming from your exit site. Warmth or a  bad smell coming from your exit site. A red streak spreading away from your exit site. General instructions Take over-the-counter and prescription medicines only as told by your health care provider. Do not take any new medicines without checking with your health care provider first. If you were given an antibiotic ointment, apply it as told by your health care provider. Keep all follow-up visits. This is important. Contact a health care provider if: You have a fever or chills. You have swelling at your exit site or swollen glands under your arm. You have signs of infection at your exit site. You have soreness, redness, or swelling in your arm that gets worse. Get help right away if: You have numbness or tingling in your fingers, hand, or arm. Your arm looks blue and feels cold. You have signs of an air embolism, such as trouble breathing, wheezing, chest pain, or a fast pulse. These symptoms may be an emergency. Get medical help right away. Call 911. Do not wait to see if the symptoms will go away. Do not drive yourself to the hospital. Summary After a PICC is removed, it is common to have tenderness or soreness, redness, swelling, or a scab at the exit site. Keep the bandage (dressing) over the exit site clean and dry. Do not remove the dressing until your health care provider tells you to do so. Do not lift anything heavy or do activities that require great effort until your health care provider says it is okay. Watch closely for any signs  of an air bubble (air embolism). If you have signs of an air embolism, call 911 right away and lie down on your left side. This information is not intended to replace advice given to you by your health care provider. Make sure you discuss any questions you have with your health care provider. Document Revised: 11/06/2020 Document Reviewed: 11/06/2020 [employer] Patient Education  2024 [employer].

## 2024-02-18 NOTE — Progress Notes (Signed)
 PICC Removal Note: S: Patient presented per MD orders for PICC line removal.  O: PICC line removed from RAC antecubital after sterile site prepped per protocol. PICC catheter tip visualized and intact. 39 cm confirmed on visual observation. Pressure dressing applied with clear Tegaderm dressing. A: No redness, ecchymosis, edema, swelling, or drainage noted at site. Patient supine for 30-minutes following removal. P: Instructions provided on post PICC discharge care, including followup notification instructions. Patient discharged in stable condition. Strict ED precautions removed.

## 2024-02-18 NOTE — Progress Notes (Signed)
 [facility]  ONCOLOGY CLINIC PROGRESS NOTE   Patient Care Team: Baxley, Ronal PARAS, MD as PCP - General (Internal Medicine) [name] [name], MD as Consulting Physician (Obstetrics and Gynecology) Nandigam, Kavitha V, MD as Consulting Physician (Gastroenterology) Dewey Rush, MD as Consulting Physician (Radiation Oncology) Autumn Millman, MD as Consulting Physician (Oncology)  PATIENT NAME: Katrina [name]   MR#: [id number] DOB: 1956/02/02  Date of visit: 02/18/2024   ASSESSMENT & PLAN:   Katrina [name] is a 68 y.o.  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   Malignant neoplasm of overlapping sites of rectum, anus and anal canal [facility]) Please review oncology history for additional details and timeline of events.  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 Biopsies confirm squamous cell carcinoma.   CT abdomen and pelvis on 01/19/2024 showed irregularity of the rectal wall with an ill-defined 1.5 x 2 cm hypodense lesion, in keeping with known rectal mass. 9 mm lymph node in the posterior pelvis adjacent to the rectosigmoid, concerning for local lymph node involvement.  No evidence of distant metastasis in the abdomen or pelvis.  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    On 02/02/2024, staging PET scan showed intense hypermetabolic activity throughout the anus, consistent with known primary anal carcinoma.  10 mm hypermetabolic left presacral lymph node, consistent with lymph node metastatic disease.  No other sites of metastatic disease was identified.  cT2,cN1,cM0, Stage II B disease.   We will proceed with  concurrent chemoradiation using 5-FU/mitomycin using Nigro protocol.  Treatments began on 02/14/2024. Chemotherapy will be given via PICC line on days 1-4 and days 29-32. We will monitor for treatment side effects such as nausea, diarrhea, low blood counts, fatigue, altered taste, and hair thinning, and manage accordingly.  She tolerated the first cycle of 5-FU and mitomycin well.  Labs today reveal no dose-limiting toxicities.  Overall she is doing very well clinically. She reports improvement in pain from the tumor, indicating a positive response to the treatment.   Her PICC line will be removed later today and will need PICC line will be placed on 03/10/2024.  I will plan to see her on 03/10/2024 with repeat labs following which she will receive her next cycle of chemotherapy on 03/13/2024.  She will continue radiation treatments in the interim as scheduled.  Chemotherapy-induced oral mucositis She reports increased sensitivity at the top of her mouth, indicating mild oral mucositis. She has not been using any mouthwashes for this condition. - Recommend mouthwash with baking soda and salt (1 teaspoon each in 8 ounces of water) 3-4 times a day - Advise using non-alcoholic mouthwash if needed  Chemotherapy-induced decreased appetite She reports a decreased appetite but is attempting to maintain nutritional intake. She is using protein drinks like Ensure Complete, which she finds palatable. - Continue using protein drinks to maintain nutritional intake  Situational insomnia She experiences situational insomnia and has been using Ativan as needed for sleep. She is cautious about the potential for habit formation and plans to limit use to a couple of times a week. - Use Ativan as needed for sleep, limiting to a couple of times a week  I reviewed lab results and outside records for this visit and discussed relevant results with the patient. Diagnosis, plan of care and treatment options were also  discussed in detail with the patient. Opportunity provided to ask questions and answers provided to her apparent satisfaction. Provided instructions to call our clinic with any problems, questions or concerns prior to return visit. I recommended to continue follow-up with PCP and sub-specialists. She verbalized understanding and agreed with the plan.   NCCN guidelines have been consulted in the planning of this patient's care.  I spent a total of 30 minutes during this encounter with the patient including review of chart and various tests results, discussions about plan of care and coordination of care plan.  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  History of Present Illness  Katrina [name] is a 68 year old female undergoing chemotherapy and radiation therapy for cancer who presents for follow-up regarding her treatment progress.  She is currently undergoing chemotherapy and radiation therapy for cancer. The PICC line remains in place but is scheduled for removal after this appointment. The chemotherapy pump has been stopped. Recent lab results indicate normal blood counts, although there is a decrease in white blood cell count from the previous week.  Her appetite is diminished, but she is making efforts to maintain nutrition. She experiences sensitivity at the top of her mouth. She reports that she almost feels like she has noticed an improvement in pain from the tumor since  starting radiation therapy. Bowel movements have been regular, with an episode of constipation last Saturday, resolved with milk of magnesia.  She uses Ativan for sleep, taking it twice a week, and finds it effective. She is cautious about frequent use due to concerns about habit formation. Atarax was tried.  Her diet has not been optimal over the past three days, but she is making efforts to consume enough nutrients, including using protein drinks like Ensure Complete. She has consulted with a nutritionist for guidance.  She noticed a bruise on left leg that appeared a few days ago, which has not changed in appearance and is not painful.     I have reviewed the past medical history, past surgical history, social history and family history with the patient and they are unchanged from previous note.  HISTORY OF PRESENT ILLNESS:   ONCOLOGY HISTORY:   Patient presented to her PCP on 11/18/2023 with complaints of rectal itching for couple of weeks and a sensation of incomplete bowel movement, associated with occasional bleeding during/after a bowel movement.  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  Otherwise unremarkable colonoscopy.  On rectal exam in the clinic, similar findings were noted with nonbleeding internal hemorrhoids with additional findings of perirectal erythema and internal erythema but no bright red blood was visualized.  Prescription sent for Proctofoam-HC twice daily.  Apparently she did not get benefit from this medication.  She was referred to GI for further evaluation.   She had consultation with Dr. Federico on 01/11/2024.  She reported that she has been noticing bleeding with every bowel movement.  Also reported some stool leakage on occasion.  On physical exam in the clinic, palpation of the inside of the anus revealed what felt like a posterior anal fissure and  significant surrounding induration in that area.  Anoscopy revealed friable indurated mucosa, internal hemorrhoids and was limited by patient discomfort.  Recommendation made for urgent colonoscopy.   On 01/18/2024, colonoscopy was performed by Dr. Shila. An infiltrative and ulcerated partially obstructing mass was found in the distal rectum.  The mass was partially circumferential (involving two thirds of the lumen circumference).  The mass measured 4 cm in length.  Oozing was present.  This was biopsied with a cold forceps for histology.  Exam was otherwise normal throughout the examined colon.   Pathology from the distal rectal lesion came back positive for invasive moderately differentiated squamous cell carcinoma.  Immunostains were not obtained.   CT abdomen and pelvis on 01/19/2024 showed irregularity of the rectal wall with an ill-defined 1.5 x 2 cm hypodense lesion, in keeping with known rectal mass. 9 mm lymph node in the posterior pelvis adjacent to the rectosigmoid, concerning for local lymph node involvement.  No evidence of distant metastasis in the abdomen or pelvis.   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   On 02/02/2024, staging PET scan showed intense hypermetabolic activity throughout the anus, consistent with known primary anal carcinoma.  10 mm hypermetabolic left presacral lymph node, consistent with lymph node metastatic disease.  No other sites of metastatic disease was identified.  cT2,cN1,cM0, Stage II B disease.   We will proceed with concurrent chemoradiation using 5-FU/mitomycin using Nigro protocol.  Treatments began on 02/14/2024.  Oncology History  Malignant neoplasm of overlapping sites of rectum, anus and anal canal (HCC)  01/27/2024 Initial Diagnosis   Primary squamous cell carcinoma of anal canal   01/27/2024 Cancer Staging   Staging form: Anus, AJCC V9 - Clinical: Stage  IIB (cT2, cN1a, cM0) - Signed by Autumn Millman, MD on 01/27/2024 Method of lymph node assessment: Clinical   02/14/2024 -  Chemotherapy   Patient is on Treatment Plan : ANUS Mitomycin D1,28 + 5FU D1-4, 28-31 q32d         REVIEW OF SYSTEMS:   Review of Systems - Oncology  All other pertinent systems were reviewed with the patient and are negative.  ALLERGIES: She has no known allergies.  MEDICATIONS:  Current Outpatient Medications  Medication Sig Dispense Refill   Adalimumab (HUMIRA PEN) 40 MG/0.4ML PNKT      Ascorbic Acid (VITAMIN C) 1000 MG tablet Take 1,000 mg by mouth daily.     calcium carbonate (OS-CAL) 600 MG TABS Take 600 mg by mouth daily.     cholecalciferol (VITAMIN D ) 1000 UNITS tablet Take by mouth daily.     clonazePAM  (KLONOPIN ) 0.1 mg/mL SUSP Take 5 mLs (0.5 mg total) by mouth 2 (two) times daily as needed. 300 mL 1   denosumab  (PROLIA ) 60 MG/ML SOSY injection Inject 60 mg into the skin every 6 (six) months.     escitalopram (LEXAPRO) 10 MG tablet Take 1 tablet (10 mg total) by mouth daily. 30 tablet 11   folic acid  (FOLVITE ) 1 MG tablet Take 2 mg by mouth daily.     hydrOXYzine (ATARAX)  10 MG tablet Take 1 tablet (10 mg total) by mouth 3 (three) times daily as needed for anxiety. 30 tablet 0   LORazepam (ATIVAN) 0.5 MG tablet Take 1 tablet (0.5 mg total) by mouth at bedtime. 15 tablet 0   meloxicam (MOBIC) 15 MG tablet Take 1 tablet by mouth as needed.     methotrexate (RHEUMATREX) 2.5 MG tablet Take 15 mg by mouth once a week. Caution:Chemotherapy. Protect from light.  PRN     NON FORMULARY Diltiazem 2%/Lidocaine5% compound Apply to rectum 3 times daily for 8 weeks 30 g 1   ondansetron  (ZOFRAN ) 8 MG tablet Take 1 tablet (8 mg total) by mouth every 8 (eight) hours as needed for nausea or vomiting. 30 tablet 1   oxyCODONE (OXY IR/ROXICODONE) 5 MG immediate release tablet Take 1 tablet (5 mg total) by mouth every 4 (four) hours as needed for severe pain (pain score  7-10). 60 tablet 0   prochlorperazine  (COMPAZINE ) 10 MG tablet Take 1 tablet (10 mg total) by mouth every 6 (six) hours as needed for nausea or vomiting. 30 tablet 1   traMADol  (ULTRAM ) 50 MG tablet Take 1 tablet (50 mg total) by mouth every 6 (six) hours as needed. 30 tablet 0   No current facility-administered medications for this visit.     VITALS:   Blood pressure 100/70, pulse 92, temperature 98.7 F (37.1 C), temperature source Temporal, resp. rate 17, height 5' 2 (1.575 m), weight 127 lb (57.6 kg), last menstrual period 05/04/2002, SpO2 97%.  Wt Readings from Last 3 Encounters:  02/18/24 127 lb (57.6 kg)  02/11/24 129 lb 6.4 oz (58.7 kg)  01/31/24 (P) 127 lb 8 oz (57.8 kg)    Body mass index is 23.23 kg/m.    Onc Performance Status - 02/18/24 1507       ECOG Perf Status   ECOG Perf Status Fully active, able to carry on all pre-disease performance without restriction      KPS SCALE   KPS % SCORE Normal, no compliants, no evidence of disease          PHYSICAL EXAM:   Physical Exam Constitutional:      General: She is not in acute distress.    Appearance: Normal appearance.  HENT:     Head: Normocephalic and atraumatic.  Cardiovascular:     Rate and Rhythm: Normal rate.  Pulmonary:     Effort: Pulmonary effort is normal. No respiratory distress.  Abdominal:     General: There is no distension.  Musculoskeletal:     Comments: Right arm PICC line in place  Neurological:     General: No focal deficit present.     Mental Status: She is alert and oriented to person, place, and time.  Psychiatric:        Mood and Affect: Mood normal.        Behavior: Behavior normal.       LABORATORY DATA:   I have reviewed the data as listed.  Results for orders placed or performed in visit on 02/18/24  Magnesium  Result Value Ref Range   Magnesium 2.1 1.7 - 2.4 mg/dL  CMP (Cancer Center only)  Result Value Ref Range   Sodium 135 135 - 145 mmol/L   Potassium 3.8  3.5 - 5.1 mmol/L   Chloride 100 98 - 111 mmol/L   CO2 29 22 - 32 mmol/L   Glucose, Bld 98 70 - 99 mg/dL   BUN 16 8 - 23  mg/dL   Creatinine 9.49 9.55 - 1.00 mg/dL   Calcium 9.3 8.9 - 89.6 mg/dL   Total Protein 7.1 6.5 - 8.1 g/dL   Albumin 3.8 3.5 - 5.0 g/dL   AST 17 15 - 41 U/L   ALT 14 0 - 44 U/L   Alkaline Phosphatase 52 38 - 126 U/L   Total Bilirubin 1.0 0.0 - 1.2 mg/dL   GFR, Estimated >39 >39 mL/min   Anion gap 6 5 - 15  CBC with Differential (Cancer Center Only)  Result Value Ref Range   WBC Count 10.0 4.0 - 10.5 K/uL   RBC 4.32 3.87 - 5.11 MIL/uL   Hemoglobin 14.0 12.0 - 15.0 g/dL   HCT 59.6 63.9 - 53.9 %   MCV 93.3 80.0 - 100.0 fL   MCH 32.4 26.0 - 34.0 pg   MCHC 34.7 30.0 - 36.0 g/dL   RDW 87.6 88.4 - 84.4 %   Platelet Count 240 150 - 400 K/uL   nRBC 0.0 0.0 - 0.2 %   Neutrophils Relative % 81 %   Neutro Abs 8.2 (H) 1.7 - 7.7 K/uL   Lymphocytes Relative 14 %   Lymphs Abs 1.4 0.7 - 4.0 K/uL   Monocytes Relative 3 %   Monocytes Absolute 0.3 0.1 - 1.0 K/uL   Eosinophils Relative 1 %   Eosinophils Absolute 0.1 0.0 - 0.5 K/uL   Basophils Relative 1 %   Basophils Absolute 0.1 0.0 - 0.1 K/uL   Immature Granulocytes 0 %   Abs Immature Granulocytes 0.03 0.00 - 0.07 K/uL  Results for orders placed or performed in visit on 02/18/24  Rad Onc Aria Session Summary  Result Value Ref Range   Course ID [email or web address]    Course Start Date 02/02/2024    Session Number 5    Course First Treatment Date 02/14/2024  1:53 PM    Course Last Treatment Date 02/18/2024  1:37 PM    Course Elapsed Days 4    Reference Point ID Anus DP    Reference Point Dosage Given to Date 9 Gy   Reference Point Session Dosage Given 1.8 Gy   Plan ID Anus    Plan Fractions Treated to Date 5    Plan Total Fractions Prescribed 30    Plan Prescribed Dose Per Fraction 1.8 Gy   Plan Total Prescribed Dose 54.000000 Gy   Plan Primary Reference Point Anus DP       RADIOGRAPHIC STUDIES:  I have personally  reviewed the radiological images as listed and agree with the findings in the report.  IR PICC PLACEMENT RIGHT >5 YRS INC IMG GUIDE Result Date: 02/11/2024 INDICATION: 68 year old patient presents with history of anal cancer. Received request for placement of peripherally inserted central catheter for chemotherapy treatment. EXAM: RIGHT UPPER EXTREMITY PICC LINE PLACEMENT WITH ULTRASOUND AND FLUOROSCOPIC GUIDANCE MEDICATIONS: 5 mL 1% lidocaine ANESTHESIA/SEDATION: None. FLUOROSCOPY: Radiation Exposure Index (as provided by the fluoroscopic device): 1 mGy Kerma COMPLICATIONS: None immediate. PROCEDURE: The patient was advised of the possible risks and complications and agreed to undergo the procedure. The patient was then brought to the angiographic suite for the procedure. The right arm was prepped with chlorhexidine, draped in the usual sterile fashion using maximum barrier technique (cap and mask, sterile gown, sterile gloves, large sterile sheet, hand hygiene and cutaneous antisepsis) and infiltrated locally with 1% Lidocaine. Ultrasound demonstrated patency of the right basilic vein, and this was documented with an image. Under real-time ultrasound guidance, this  vein was accessed with a 21 gauge micropuncture needle and image documentation was performed. A 0.018 wire was introduced in to the vein. Over this, a 5 [place] double lumen power injectable PICC was advanced to the lower SVC/right atrial junction. Fluoroscopy during the procedure and fluoro spot radiograph confirms appropriate catheter position. The catheter was flushed and covered with a sterile dressing. Catheter length: 39 cm IMPRESSION: Successful right arm power injectable PICC line placement with ultrasound and fluoroscopic guidance. The catheter is ready for use. Performed by: Kristi Davenport, NP / Juliene Balder, MD Electronically Signed   By: Juliene Balder M.D.   On: 02/11/2024 10:15   NM PET Image Initial (PI) Skull Base To Thigh Result Date:  02/03/2024 CLINICAL DATA:  Initial treatment strategy for anal squamous cell carcinoma. EXAM: NUCLEAR MEDICINE PET SKULL BASE TO THIGH TECHNIQUE: 6.4 mCi F-18 FDG was injected intravenously. Full-ring PET imaging was performed from the skull base to thigh after the radiotracer. CT data was obtained and used for attenuation correction and anatomic localization. Fasting blood glucose: 95 mg/dl COMPARISON:  CT on 90/82/7974 FINDINGS: Mediastinal blood-pool activity (background): SUV max = 2.6 Liver activity (reference): SUV max = N/A NECK:  No hypermetabolic lymph nodes or masses. Incidental CT findings:  None. CHEST: No hypermetabolic lymph nodes. No suspicious pulmonary nodules seen on CT images. Incidental CT findings:  None. ABDOMEN/PELVIS: Intense hypermetabolic activity is seen throughout the anus, with SUV max of 16.8. This corresponds to the known primary anal carcinoma. 8 x 10 mm left presacral lymph node is seen near the rectosigmoid junction which shows intense hypermetabolism, with SUV max of 7.6. No other hypermetabolic lymph nodes are seen within the pelvis or abdomen. No abnormal hypermetabolic activity within the liver, pancreas, adrenal glands, or spleen. Incidental CT findings:  1.5 cm calcified uterine fibroid. SKELETON: No focal hypermetabolic bone lesions to suggest skeletal metastasis. Incidental CT findings:  None. IMPRESSION: Intense hypermetabolic activity throughout the anus, consistent with known primary anal carcinoma. 10 mm hypermetabolic left presacral lymph node, consistent with metastatic disease. No other sites of metastatic disease identified. Electronically Signed   By: Norleen DELENA Kil M.D.   On: 02/03/2024 09:28    CODE STATUS:   Orders Placed This Encounter  Procedures   CBC with Differential (Cancer Center Only)    Standing Status:   Future    Expected Date:   03/10/2024    Expiration Date:   06/08/2024   CMP (Cancer Center only)    Standing Status:   Future    Expected  Date:   03/10/2024    Expiration Date:   06/08/2024   Magnesium    Standing Status:   Future    Expected Date:   03/10/2024    Expiration Date:   06/08/2024     Future Appointments  Date Time Provider Department Center  02/21/2024  2:45 PM [id number] [facility] None  02/22/2024  1:30 PM [id number] None  02/23/2024  1:30 PM [id number] None  02/23/2024  1:45 PM [facility]-LAN[facility] None  02/24/2024  1:30 PM [id number] None  02/25/2024  1:30 PM [id number] None  02/25/2024  1:45 PM Ivonne Harlene RAMAN, RD [facility] None  02/28/2024  1:30 PM [id number] [facility] None  02/29/2024  1:30 PM [id number] [facility] None  03/01/2024  1:00 PM [id number] None  03/02/2024  1:00 PM [id number] None  03/03/2024  1:00 PM [id number] None  03/06/2024  1:00 PM [id number] [facility] None  03/07/2024  1:00 PM [id number] [facility] None  03/08/2024  1:00 PM [id number] None  03/09/2024  1:15 PM [id number] [facility] None  03/10/2024  8:30 AM [facility] 1 [facility] [place]  03/10/2024  1:15 PM [id number] [facility] None  03/13/2024  1:15 PM [id number] [facility] None  03/14/2024  1:15 PM [id number] [facility] None  03/15/2024  1:15 PM [id number] [facility] None  03/16/2024  1:15 PM [id number] None  03/17/2024  1:15 PM [id number] [facility] None  03/20/2024  1:15 PM [id number] [facility] None  03/21/2024  1:15 PM Maritza Stagger, MD [facility] None  03/22/2024  1:15 PM [facility] LINAC 4 [facility] None  03/23/2024  1:15 PM [facility] LINAC 4 [facility] None  03/24/2024  1:15 PM [facility] LINAC 4 [facility] None  04/05/2024 11:00 AM [facility] 3 [facility] None   04/06/2024  2:00 PM Helmus, Jitka, PT [facility] None  04/13/2024  2:00 PM Helmus, Jitka, PT [facility] None  04/20/2024  2:00 PM Helmus, Jitka, PT [facility] None  04/24/2024  2:00 PM Helmus, Jitka, PT [facility] None  05/08/2024  2:00 PM Helmus, Jitka, PT [facility] None  06/08/2024  9:10 AM [facility] [address]  06/12/2024  3:00 PM Baxley, Ronal PARAS, MD [facility] [address]      This document was completed utilizing speech recognition software. Grammatical errors, random word insertions, pronoun errors, and incomplete sentences are an occasional consequence of this system due to software limitations, ambient noise, and hardware issues. Any formal questions or concerns about the content, text or information contained within the body of this dictation should be directly addressed to the provider for clarification.

## 2024-02-21 ENCOUNTER — Other Ambulatory Visit: Payer: Self-pay

## 2024-02-21 ENCOUNTER — Ambulatory Visit
Admission: RE | Admit: 2024-02-21 | Discharge: 2024-02-21 | Disposition: A | Discharge status: Home / Self Care | Source: Ambulatory Visit | Attending: Radiation Oncology | Admitting: Radiation Oncology

## 2024-02-21 DIAGNOSIS — Z510 Encounter for antineoplastic radiation therapy: Secondary | ICD-10-CM | POA: Diagnosis not present

## 2024-02-21 DIAGNOSIS — C218 Malignant neoplasm of overlapping sites of rectum, anus and anal canal: Secondary | ICD-10-CM | POA: Diagnosis not present

## 2024-02-21 LAB — RAD ONC ARIA SESSION SUMMARY
Course Elapsed Days: 7
Plan Fractions Treated to Date: 6
Plan Prescribed Dose Per Fraction: 1.8 Gy
Plan Total Fractions Prescribed: 30
Plan Total Prescribed Dose: 54 Gy
Reference Point Dosage Given to Date: 10.8 Gy
Reference Point Session Dosage Given: 1.8 Gy
Session Number: 6

## 2024-02-22 ENCOUNTER — Other Ambulatory Visit: Payer: Self-pay

## 2024-02-22 ENCOUNTER — Ambulatory Visit
Admission: RE | Admit: 2024-02-22 | Discharge: 2024-02-22 | Disposition: A | Discharge status: Home / Self Care | Source: Ambulatory Visit | Attending: Radiation Oncology | Admitting: Radiation Oncology

## 2024-02-22 DIAGNOSIS — C218 Malignant neoplasm of overlapping sites of rectum, anus and anal canal: Secondary | ICD-10-CM | POA: Diagnosis not present

## 2024-02-22 DIAGNOSIS — Z510 Encounter for antineoplastic radiation therapy: Secondary | ICD-10-CM | POA: Diagnosis not present

## 2024-02-22 LAB — RAD ONC ARIA SESSION SUMMARY
Course Elapsed Days: 8
Plan Fractions Treated to Date: 7
Plan Prescribed Dose Per Fraction: 1.8 Gy
Plan Total Fractions Prescribed: 30
Plan Total Prescribed Dose: 54 Gy
Reference Point Dosage Given to Date: 12.6 Gy
Reference Point Session Dosage Given: 1.8 Gy
Session Number: 7

## 2024-02-23 ENCOUNTER — Ambulatory Visit
Admission: RE | Admit: 2024-02-23 | Discharge: 2024-02-23 | Disposition: A | Source: Ambulatory Visit | Attending: Radiation Oncology

## 2024-02-23 ENCOUNTER — Ambulatory Visit
Admission: RE | Admit: 2024-02-23 | Discharge: 2024-02-23 | Disposition: A | Discharge status: Home / Self Care | Source: Ambulatory Visit | Attending: Radiation Oncology | Admitting: Radiation Oncology

## 2024-02-23 ENCOUNTER — Other Ambulatory Visit: Payer: Self-pay

## 2024-02-23 ENCOUNTER — Other Ambulatory Visit: Payer: Self-pay | Admitting: Radiation Oncology

## 2024-02-23 ENCOUNTER — Telehealth: Payer: Self-pay

## 2024-02-23 DIAGNOSIS — C218 Malignant neoplasm of overlapping sites of rectum, anus and anal canal: Secondary | ICD-10-CM | POA: Diagnosis not present

## 2024-02-23 DIAGNOSIS — Z510 Encounter for antineoplastic radiation therapy: Secondary | ICD-10-CM | POA: Diagnosis not present

## 2024-02-23 LAB — RAD ONC ARIA SESSION SUMMARY
Course Elapsed Days: 9
Plan Fractions Treated to Date: 8
Plan Prescribed Dose Per Fraction: 1.8 Gy
Plan Total Fractions Prescribed: 30
Plan Total Prescribed Dose: 54 Gy
Reference Point Dosage Given to Date: 14.4 Gy
Reference Point Session Dosage Given: 1.8 Gy
Session Number: 8

## 2024-02-23 MED ORDER — MAGIC MOUTHWASH W/LIDOCAINE
ORAL | 0 refills | Status: AC
Start: 2024-02-23 — End: ?

## 2024-02-23 NOTE — Telephone Encounter (Signed)
 [facility] CSW Progress Note  Clinical Child psychotherapist contacted patient by phone to follow-up on emotional support as patient continues through treatment. CSW left message offering appointment with CSW if needed.   Lizbeth Sprague, LCSW Clinical Social Worker [facility]

## 2024-02-24 ENCOUNTER — Ambulatory Visit
Admission: RE | Admit: 2024-02-24 | Discharge: 2024-02-24 | Disposition: A | Source: Ambulatory Visit | Attending: Radiation Oncology

## 2024-02-24 ENCOUNTER — Telehealth: Payer: Self-pay

## 2024-02-24 ENCOUNTER — Other Ambulatory Visit: Payer: Self-pay

## 2024-02-24 DIAGNOSIS — C218 Malignant neoplasm of overlapping sites of rectum, anus and anal canal: Secondary | ICD-10-CM | POA: Diagnosis not present

## 2024-02-24 DIAGNOSIS — Z510 Encounter for antineoplastic radiation therapy: Secondary | ICD-10-CM | POA: Diagnosis not present

## 2024-02-24 LAB — RAD ONC ARIA SESSION SUMMARY
Course Elapsed Days: 10
Plan Fractions Treated to Date: 9
Plan Prescribed Dose Per Fraction: 1.8 Gy
Plan Total Fractions Prescribed: 30
Plan Total Prescribed Dose: 54 Gy
Reference Point Dosage Given to Date: 16.2 Gy
Reference Point Session Dosage Given: 1.8 Gy
Session Number: 9

## 2024-02-24 NOTE — Telephone Encounter (Signed)
 Patient called with concerns for her next treatment and does not agree with the timing. She shared that she was told her next treatment would be on week #5. Dr. Autumn and GI Nurse Navigator made aware on these concerns in Secure Chat.

## 2024-02-25 ENCOUNTER — Ambulatory Visit
Admission: RE | Admit: 2024-02-25 | Discharge: 2024-02-25 | Disposition: A | Discharge status: Home / Self Care | Source: Ambulatory Visit | Attending: Radiation Oncology | Admitting: Radiation Oncology

## 2024-02-25 ENCOUNTER — Other Ambulatory Visit: Payer: Self-pay

## 2024-02-25 ENCOUNTER — Inpatient Hospital Stay: Admitting: Dietician

## 2024-02-25 DIAGNOSIS — C218 Malignant neoplasm of overlapping sites of rectum, anus and anal canal: Secondary | ICD-10-CM | POA: Diagnosis not present

## 2024-02-25 DIAGNOSIS — Z510 Encounter for antineoplastic radiation therapy: Secondary | ICD-10-CM | POA: Diagnosis not present

## 2024-02-25 DIAGNOSIS — C44520 Squamous cell carcinoma of anal skin: Secondary | ICD-10-CM | POA: Diagnosis not present

## 2024-02-25 LAB — RAD ONC ARIA SESSION SUMMARY
Course Elapsed Days: 11
Plan Fractions Treated to Date: 10
Plan Prescribed Dose Per Fraction: 1.8 Gy
Plan Total Fractions Prescribed: 30
Plan Total Prescribed Dose: 54 Gy
Reference Point Dosage Given to Date: 18 Gy
Reference Point Session Dosage Given: 1.8 Gy
Session Number: 10

## 2024-02-28 ENCOUNTER — Encounter: Payer: Self-pay | Admitting: Internal Medicine

## 2024-02-28 ENCOUNTER — Encounter: Payer: Self-pay | Admitting: Oncology

## 2024-02-28 ENCOUNTER — Other Ambulatory Visit: Payer: Self-pay

## 2024-02-28 ENCOUNTER — Ambulatory Visit
Admission: RE | Admit: 2024-02-28 | Discharge: 2024-02-28 | Disposition: A | Discharge status: Home / Self Care | Source: Ambulatory Visit | Attending: Radiation Oncology | Admitting: Radiation Oncology

## 2024-02-28 DIAGNOSIS — Z510 Encounter for antineoplastic radiation therapy: Secondary | ICD-10-CM | POA: Diagnosis not present

## 2024-02-28 LAB — RAD ONC ARIA SESSION SUMMARY
Course Elapsed Days: 14
Plan Fractions Treated to Date: 11
Plan Prescribed Dose Per Fraction: 1.8 Gy
Plan Total Fractions Prescribed: 30
Plan Total Prescribed Dose: 54 Gy
Reference Point Dosage Given to Date: 19.8 Gy
Reference Point Session Dosage Given: 1.8 Gy
Session Number: 11

## 2024-02-28 NOTE — Progress Notes (Signed)
 Nutrition Follow-up:  Patient with SCC of anal canal. She is receiving concurrent chemoradiation with mitomycin/5FU. She is under the care of Dr. Autumn  Met with patient and husband in office following radiation. She reports tolerating concurrent therapy well overall. Currently denies radiation induced side effects. Her appetite is good. Eating smaller meals more often. Recalls cheese omelette, toast with apple butter for breakfast. Ate hamburger, fries and coke for lunch, chicken soup for dinner. She is drinking one premier protein. Patient drinking ~64 ounces of water along with ginger ale and sprite.     Medications: reviewed   Labs: reviewed   Anthropometrics: Wt 127 lb today   10/10 - 129 lb 6.4 oz 9/29 - 127 lb 8 oz   Estimated Energy Needs  Kcals: 1800-2000 Protein: 71-89 Fluid: >/= 2L  NUTRITION DIAGNOSIS: Food and nutrition related knowledge deficit improving   INTERVENTION:  Explained rationale for low fiber diet (avoiding raw F/V) during treatment - handout with list of foods provided Continue strategies for increasing calories and protein with small frequent meals/snacks - snack ideas provided Provided sample of Banatrol to try if pt begins to have watery stools Continue daily Premier protein  All questions answered Contact information    MONITORING, EVALUATION, GOAL: wt trends, intake    NEXT VISIT: To be determined with infusion schedule

## 2024-02-29 ENCOUNTER — Inpatient Hospital Stay

## 2024-02-29 ENCOUNTER — Other Ambulatory Visit: Payer: Self-pay

## 2024-02-29 ENCOUNTER — Ambulatory Visit
Admission: RE | Admit: 2024-02-29 | Discharge: 2024-02-29 | Disposition: A | Source: Ambulatory Visit | Attending: Radiation Oncology

## 2024-02-29 DIAGNOSIS — C218 Malignant neoplasm of overlapping sites of rectum, anus and anal canal: Secondary | ICD-10-CM | POA: Diagnosis not present

## 2024-02-29 DIAGNOSIS — Z510 Encounter for antineoplastic radiation therapy: Secondary | ICD-10-CM | POA: Diagnosis not present

## 2024-02-29 LAB — RAD ONC ARIA SESSION SUMMARY
Course Elapsed Days: 15
Plan Fractions Treated to Date: 12
Plan Prescribed Dose Per Fraction: 1.8 Gy
Plan Total Fractions Prescribed: 30
Plan Total Prescribed Dose: 54 Gy
Reference Point Dosage Given to Date: 21.6 Gy
Reference Point Session Dosage Given: 1.8 Gy
Session Number: 12

## 2024-03-01 ENCOUNTER — Ambulatory Visit
Admission: RE | Admit: 2024-03-01 | Discharge: 2024-03-01 | Disposition: A | Discharge status: Home / Self Care | Source: Ambulatory Visit | Attending: Radiation Oncology | Admitting: Radiation Oncology

## 2024-03-01 ENCOUNTER — Other Ambulatory Visit: Payer: Self-pay

## 2024-03-01 DIAGNOSIS — C218 Malignant neoplasm of overlapping sites of rectum, anus and anal canal: Secondary | ICD-10-CM | POA: Diagnosis not present

## 2024-03-01 DIAGNOSIS — Z510 Encounter for antineoplastic radiation therapy: Secondary | ICD-10-CM | POA: Diagnosis not present

## 2024-03-01 LAB — RAD ONC ARIA SESSION SUMMARY
Course Elapsed Days: 16
Plan Fractions Treated to Date: 13
Plan Prescribed Dose Per Fraction: 1.8 Gy
Plan Total Fractions Prescribed: 30
Plan Total Prescribed Dose: 54 Gy
Reference Point Dosage Given to Date: 23.4 Gy
Reference Point Session Dosage Given: 1.8 Gy
Session Number: 13

## 2024-03-02 ENCOUNTER — Ambulatory Visit
Admission: RE | Admit: 2024-03-02 | Discharge: 2024-03-02 | Disposition: A | Discharge status: Home / Self Care | Source: Ambulatory Visit | Attending: Radiation Oncology | Admitting: Radiation Oncology

## 2024-03-02 ENCOUNTER — Other Ambulatory Visit: Payer: Self-pay

## 2024-03-02 DIAGNOSIS — C218 Malignant neoplasm of overlapping sites of rectum, anus and anal canal: Secondary | ICD-10-CM | POA: Diagnosis not present

## 2024-03-02 DIAGNOSIS — Z510 Encounter for antineoplastic radiation therapy: Secondary | ICD-10-CM | POA: Diagnosis not present

## 2024-03-02 LAB — RAD ONC ARIA SESSION SUMMARY
Course Elapsed Days: 17
Plan Fractions Treated to Date: 14
Plan Prescribed Dose Per Fraction: 1.8 Gy
Plan Total Fractions Prescribed: 30
Plan Total Prescribed Dose: 54 Gy
Reference Point Dosage Given to Date: 25.2 Gy
Reference Point Session Dosage Given: 1.8 Gy
Session Number: 14

## 2024-03-03 ENCOUNTER — Other Ambulatory Visit: Payer: Self-pay

## 2024-03-03 ENCOUNTER — Ambulatory Visit
Admission: RE | Admit: 2024-03-03 | Discharge: 2024-03-03 | Disposition: A | Discharge status: Home / Self Care | Source: Ambulatory Visit | Attending: Radiation Oncology | Admitting: Radiation Oncology

## 2024-03-03 DIAGNOSIS — Z510 Encounter for antineoplastic radiation therapy: Secondary | ICD-10-CM | POA: Diagnosis not present

## 2024-03-03 LAB — RAD ONC ARIA SESSION SUMMARY
Course Elapsed Days: 18
Plan Fractions Treated to Date: 15
Plan Prescribed Dose Per Fraction: 1.8 Gy
Plan Total Fractions Prescribed: 30
Plan Total Prescribed Dose: 54 Gy
Reference Point Dosage Given to Date: 27 Gy
Reference Point Session Dosage Given: 1.8 Gy
Session Number: 15

## 2024-03-04 ENCOUNTER — Ambulatory Visit

## 2024-03-06 ENCOUNTER — Other Ambulatory Visit: Payer: Self-pay

## 2024-03-06 ENCOUNTER — Ambulatory Visit
Admission: RE | Admit: 2024-03-06 | Discharge: 2024-03-06 | Disposition: A | Discharge status: Home / Self Care | Source: Ambulatory Visit | Attending: Radiation Oncology | Admitting: Radiation Oncology

## 2024-03-06 DIAGNOSIS — C218 Malignant neoplasm of overlapping sites of rectum, anus and anal canal: Secondary | ICD-10-CM | POA: Insufficient documentation

## 2024-03-06 DIAGNOSIS — Z510 Encounter for antineoplastic radiation therapy: Secondary | ICD-10-CM | POA: Diagnosis present

## 2024-03-06 DIAGNOSIS — R300 Dysuria: Secondary | ICD-10-CM | POA: Diagnosis not present

## 2024-03-06 LAB — RAD ONC ARIA SESSION SUMMARY
Course Elapsed Days: 21
Plan Fractions Treated to Date: 16
Plan Prescribed Dose Per Fraction: 1.8 Gy
Plan Total Fractions Prescribed: 30
Plan Total Prescribed Dose: 54 Gy
Reference Point Dosage Given to Date: 28.8 Gy
Reference Point Session Dosage Given: 1.8 Gy
Session Number: 16

## 2024-03-07 ENCOUNTER — Inpatient Hospital Stay: Attending: Oncology

## 2024-03-07 ENCOUNTER — Other Ambulatory Visit: Payer: Self-pay

## 2024-03-07 ENCOUNTER — Ambulatory Visit
Admission: RE | Admit: 2024-03-07 | Discharge: 2024-03-07 | Disposition: A | Source: Ambulatory Visit | Attending: Radiation Oncology

## 2024-03-07 DIAGNOSIS — G4700 Insomnia, unspecified: Secondary | ICD-10-CM | POA: Insufficient documentation

## 2024-03-07 DIAGNOSIS — C218 Malignant neoplasm of overlapping sites of rectum, anus and anal canal: Secondary | ICD-10-CM | POA: Insufficient documentation

## 2024-03-07 DIAGNOSIS — M810 Age-related osteoporosis without current pathological fracture: Secondary | ICD-10-CM | POA: Insufficient documentation

## 2024-03-07 DIAGNOSIS — Z5111 Encounter for antineoplastic chemotherapy: Secondary | ICD-10-CM | POA: Insufficient documentation

## 2024-03-07 DIAGNOSIS — Z923 Personal history of irradiation: Secondary | ICD-10-CM | POA: Insufficient documentation

## 2024-03-07 DIAGNOSIS — Z79899 Other long term (current) drug therapy: Secondary | ICD-10-CM | POA: Insufficient documentation

## 2024-03-07 DIAGNOSIS — C211 Malignant neoplasm of anal canal: Secondary | ICD-10-CM | POA: Insufficient documentation

## 2024-03-07 DIAGNOSIS — Z510 Encounter for antineoplastic radiation therapy: Secondary | ICD-10-CM | POA: Diagnosis not present

## 2024-03-07 DIAGNOSIS — M069 Rheumatoid arthritis, unspecified: Secondary | ICD-10-CM | POA: Insufficient documentation

## 2024-03-07 DIAGNOSIS — G893 Neoplasm related pain (acute) (chronic): Secondary | ICD-10-CM | POA: Insufficient documentation

## 2024-03-07 LAB — RAD ONC ARIA SESSION SUMMARY
Course Elapsed Days: 22
Plan Fractions Treated to Date: 17
Plan Prescribed Dose Per Fraction: 1.8 Gy
Plan Total Fractions Prescribed: 30
Plan Total Prescribed Dose: 54 Gy
Reference Point Dosage Given to Date: 30.6 Gy
Reference Point Session Dosage Given: 1.8 Gy
Session Number: 17

## 2024-03-07 NOTE — Progress Notes (Signed)
 [facility] CSW Progress Note  Visual Merchandiser met with patient via tele-health video for psychosocial support as she continues through treatment. Patient and CSW discussed treatment and effects thus far, and supports that have been helpful with managing the diagnosis.     Follow Up Plan:  CSW will see patient via tele-health on 11/25    Lizbeth Sprague, LCSW Clinical Social Worker [facility]

## 2024-03-08 ENCOUNTER — Other Ambulatory Visit: Payer: Self-pay

## 2024-03-08 ENCOUNTER — Ambulatory Visit
Admission: RE | Admit: 2024-03-08 | Discharge: 2024-03-08 | Disposition: A | Discharge status: Home / Self Care | Source: Ambulatory Visit | Attending: Radiation Oncology | Admitting: Radiation Oncology

## 2024-03-08 DIAGNOSIS — Z510 Encounter for antineoplastic radiation therapy: Secondary | ICD-10-CM | POA: Diagnosis not present

## 2024-03-08 LAB — RAD ONC ARIA SESSION SUMMARY
Course Elapsed Days: 23
Plan Fractions Treated to Date: 18
Plan Prescribed Dose Per Fraction: 1.8 Gy
Plan Total Fractions Prescribed: 30
Plan Total Prescribed Dose: 54 Gy
Reference Point Dosage Given to Date: 32.4 Gy
Reference Point Session Dosage Given: 1.8 Gy
Session Number: 18

## 2024-03-09 ENCOUNTER — Other Ambulatory Visit: Payer: Self-pay

## 2024-03-09 ENCOUNTER — Ambulatory Visit
Admission: RE | Admit: 2024-03-09 | Discharge: 2024-03-09 | Disposition: A | Source: Ambulatory Visit | Attending: Radiation Oncology

## 2024-03-09 DIAGNOSIS — Z510 Encounter for antineoplastic radiation therapy: Secondary | ICD-10-CM | POA: Diagnosis not present

## 2024-03-09 LAB — RAD ONC ARIA SESSION SUMMARY
Course Elapsed Days: 24
Plan Fractions Treated to Date: 19
Plan Prescribed Dose Per Fraction: 1.8 Gy
Plan Total Fractions Prescribed: 30
Plan Total Prescribed Dose: 54 Gy
Reference Point Dosage Given to Date: 34.2 Gy
Reference Point Session Dosage Given: 1.8 Gy
Session Number: 19

## 2024-03-10 ENCOUNTER — Inpatient Hospital Stay: Admitting: Oncology

## 2024-03-10 ENCOUNTER — Inpatient Hospital Stay

## 2024-03-10 ENCOUNTER — Encounter: Payer: Self-pay | Admitting: Oncology

## 2024-03-10 ENCOUNTER — Ambulatory Visit (HOSPITAL_COMMUNITY)
Admission: RE | Admit: 2024-03-10 | Discharge: 2024-03-10 | Disposition: A | Discharge status: Home / Self Care | Source: Ambulatory Visit | Attending: Oncology | Admitting: Oncology

## 2024-03-10 ENCOUNTER — Ambulatory Visit
Admission: RE | Admit: 2024-03-10 | Discharge: 2024-03-10 | Disposition: A | Discharge status: Home / Self Care | Source: Ambulatory Visit | Attending: Radiation Oncology | Admitting: Radiation Oncology

## 2024-03-10 ENCOUNTER — Other Ambulatory Visit: Payer: Self-pay

## 2024-03-10 VITALS — BP 120/75 | HR 87 | Temp 97.2°F | Resp 20 | Wt 122.6 lb

## 2024-03-10 DIAGNOSIS — Z510 Encounter for antineoplastic radiation therapy: Secondary | ICD-10-CM | POA: Diagnosis not present

## 2024-03-10 DIAGNOSIS — C44520 Squamous cell carcinoma of anal skin: Secondary | ICD-10-CM | POA: Diagnosis not present

## 2024-03-10 DIAGNOSIS — M0579 Rheumatoid arthritis with rheumatoid factor of multiple sites without organ or systems involvement: Secondary | ICD-10-CM

## 2024-03-10 DIAGNOSIS — G893 Neoplasm related pain (acute) (chronic): Secondary | ICD-10-CM | POA: Diagnosis not present

## 2024-03-10 DIAGNOSIS — C218 Malignant neoplasm of overlapping sites of rectum, anus and anal canal: Secondary | ICD-10-CM

## 2024-03-10 DIAGNOSIS — M810 Age-related osteoporosis without current pathological fracture: Secondary | ICD-10-CM | POA: Diagnosis not present

## 2024-03-10 DIAGNOSIS — Z79899 Other long term (current) drug therapy: Secondary | ICD-10-CM | POA: Diagnosis not present

## 2024-03-10 DIAGNOSIS — Z923 Personal history of irradiation: Secondary | ICD-10-CM | POA: Diagnosis not present

## 2024-03-10 DIAGNOSIS — G4700 Insomnia, unspecified: Secondary | ICD-10-CM | POA: Diagnosis not present

## 2024-03-10 DIAGNOSIS — C211 Malignant neoplasm of anal canal: Secondary | ICD-10-CM | POA: Diagnosis not present

## 2024-03-10 DIAGNOSIS — Z5111 Encounter for antineoplastic chemotherapy: Secondary | ICD-10-CM | POA: Diagnosis present

## 2024-03-10 DIAGNOSIS — M069 Rheumatoid arthritis, unspecified: Secondary | ICD-10-CM | POA: Diagnosis not present

## 2024-03-10 LAB — RAD ONC ARIA SESSION SUMMARY
Course Elapsed Days: 25
Plan Fractions Treated to Date: 20
Plan Prescribed Dose Per Fraction: 1.8 Gy
Plan Total Fractions Prescribed: 30
Plan Total Prescribed Dose: 54 Gy
Reference Point Dosage Given to Date: 36 Gy
Reference Point Session Dosage Given: 1.8 Gy
Session Number: 20

## 2024-03-10 LAB — CBC WITH DIFFERENTIAL (CANCER CENTER ONLY)
Abs Immature Granulocytes: 0.1 K/uL — ABNORMAL HIGH (ref 0.00–0.07)
Basophils Absolute: 0 K/uL (ref 0.0–0.1)
Basophils Relative: 1 %
Eosinophils Absolute: 0.3 K/uL (ref 0.0–0.5)
Eosinophils Relative: 6 %
HCT: 37 % (ref 36.0–46.0)
Hemoglobin: 12.6 g/dL (ref 12.0–15.0)
Immature Granulocytes: 2 %
Lymphocytes Relative: 15 %
Lymphs Abs: 0.8 K/uL (ref 0.7–4.0)
MCH: 31.7 pg (ref 26.0–34.0)
MCHC: 34.1 g/dL (ref 30.0–36.0)
MCV: 93.2 fL (ref 80.0–100.0)
Monocytes Absolute: 1.1 K/uL — ABNORMAL HIGH (ref 0.1–1.0)
Monocytes Relative: 21 %
Neutro Abs: 3 K/uL (ref 1.7–7.7)
Neutrophils Relative %: 55 %
Platelet Count: 494 K/uL — ABNORMAL HIGH (ref 150–400)
RBC: 3.97 MIL/uL (ref 3.87–5.11)
RDW: 13.6 % (ref 11.5–15.5)
WBC Count: 5.4 K/uL (ref 4.0–10.5)
nRBC: 0 % (ref 0.0–0.2)

## 2024-03-10 LAB — CMP (CANCER CENTER ONLY)
ALT: 11 U/L (ref 0–44)
AST: 16 U/L (ref 15–41)
Albumin: 3.7 g/dL (ref 3.5–5.0)
Alkaline Phosphatase: 51 U/L (ref 38–126)
Anion gap: 7 (ref 5–15)
BUN: 15 mg/dL (ref 8–23)
CO2: 28 mmol/L (ref 22–32)
Calcium: 9.1 mg/dL (ref 8.9–10.3)
Chloride: 103 mmol/L (ref 98–111)
Creatinine: 0.59 mg/dL (ref 0.44–1.00)
GFR, Estimated: >60 mL/min (ref >60)
Glucose, Bld: 101 mg/dL — ABNORMAL HIGH (ref 70–99)
Potassium: 3.9 mmol/L (ref 3.5–5.1)
Sodium: 138 mmol/L (ref 135–145)
Total Bilirubin: 0.3 mg/dL (ref 0.0–1.2)
Total Protein: 7.1 g/dL (ref 6.5–8.1)

## 2024-03-10 LAB — MAGNESIUM: Magnesium: 1.9 mg/dL (ref 1.7–2.4)

## 2024-03-10 MED ORDER — HEPARIN SOD (PORK) LOCK FLUSH 100 UNIT/ML IV SOLN
INTRAVENOUS | Status: AC
Start: 2024-03-10 — End: 2024-03-10
  Filled 2024-03-10: qty 5

## 2024-03-10 MED ORDER — LIDOCAINE HCL 1 % IJ SOLN: 20.0000 mL | Freq: Once | INTRAMUSCULAR | Status: DC

## 2024-03-10 MED ORDER — HEPARIN SOD (PORK) LOCK FLUSH 100 UNIT/ML IV SOLN: 500.0000 [IU] | Freq: Once | INTRAVENOUS | Status: DC

## 2024-03-10 MED ORDER — LIDOCAINE HCL 1 % IJ SOLN
INTRAMUSCULAR | Status: AC
  Filled 2024-03-10: qty 20

## 2024-03-10 NOTE — Progress Notes (Signed)
 [facility]  ONCOLOGY CLINIC PROGRESS NOTE   Patient Care Team: Baxley, Ronal PARAS, MD as PCP - General (Internal Medicine) Cathlyn Katrina [name], MD as Consulting Physician (Obstetrics and Gynecology) Nandigam, Kavitha V, MD as Consulting Physician (Gastroenterology) Katrina Rush, MD as Consulting Physician (Radiation Oncology) Katrina Millman, MD as Consulting Physician (Oncology)  PATIENT NAME: Katrina [name]   MR#: [id number] DOB: 01-16-1956  Date of visit: 03/10/2024   ASSESSMENT & PLAN:   Katrina [name] is a 68 y.o. 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   Malignant neoplasm of overlapping sites of rectum, anus and anal canal [facility]) Please review oncology history for additional details and timeline of events.  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 Biopsies confirm squamous cell carcinoma.   CT abdomen and pelvis on 01/19/2024 showed irregularity of the rectal wall with an ill-defined 1.5 x 2 cm hypodense lesion, in keeping with known rectal mass. 9 mm lymph node in the posterior pelvis adjacent to the rectosigmoid, concerning for local lymph node involvement.  No evidence of distant metastasis in the abdomen or pelvis.  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    On 02/02/2024, staging PET scan showed intense hypermetabolic activity throughout the anus, consistent with known primary anal carcinoma.  10 mm hypermetabolic left presacral lymph node, consistent with lymph node metastatic disease.  No other sites of metastatic disease was identified.  cT2,cN1,cM0, Stage II B disease.   We will proceed with  concurrent chemoradiation using 5-FU/mitomycin using Nigro protocol.  Treatments began on 02/14/2024. Chemotherapy will be given via PICC line on days 1-4 and days 29-32. We will monitor for treatment side effects such as nausea, diarrhea, low blood counts, fatigue, altered taste, and hair thinning, and manage accordingly.  She tolerated the first cycle of 5-FU and mitomycin well.  Labs today reveal no dose-limiting toxicities.  Overall she is doing very well clinically. She reports improvement in pain from the tumor, indicating a positive response to the treatment.   She had a PICC line placed again today.  Labs today reveal no dose-limiting toxicities.  She will receive her next cycle of chemotherapy starting on 03/13/2024.  She will continue radiation treatments as scheduled.  - Continue using Aquaphor for vaginal itching as needed.  I will see her in 1 week for toxicity evaluation.  Rheumatoid arthritis (HCC) Rheumatoid arthritis is well-managed with methotrexate and Humira. Chemotherapy may have a beneficial effect on RA symptoms. - Pause Humira and methotrexate during chemotherapy treatment.  Chemotherapy-induced oral mucositis  No mouth sores currently, previous sores resolved with magic mouthwash and salt and baking soda rinse.  Chemotherapy-induced decreased appetite She reports a decreased appetite but is attempting to maintain nutritional intake. She is using protein drinks like Ensure Complete, which she finds palatable. - Continue using protein drinks to maintain nutritional intake  Situational insomnia Sleeping well with Lexapro and minimal use of Ativan.  I reviewed lab results and outside records for this visit and discussed relevant results with the patient. Diagnosis, plan of care and treatment options were also discussed in detail with the patient. Opportunity provided to ask questions and answers provided to her  apparent satisfaction. Provided instructions to call our  clinic with any problems, questions or concerns prior to return visit. I recommended to continue follow-up with PCP and sub-specialists. She verbalized understanding and agreed with the plan.   NCCN guidelines have been consulted in the planning of this patient's care.  I spent a total of 40 minutes during this encounter with the patient including review of chart and various tests results, discussions about plan of care and coordination of care plan.  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  History of Present Illness  Katrina [name] is a 68 year old female undergoing radiation and chemotherapy for cancer who presents for follow-up on her treatment progress.  She is currently undergoing radiation therapy, which began on October 13th, with ten sessions remaining. The treatments are proceeding without interruptions, and she has not required any pain medication recently, indicating good tolerance.  She is also receiving chemotherapy through a PICC line. Blood work reveals normal white and red blood cell counts, with slightly elevated platelets. Kidney and liver function tests are normal.  She experiences some mouth soreness, which has improved with 'magic mouthwash' and a salt and baking soda rinse. Her appetite is reduced, but she manages to eat, including [employer] hamburgers and French fries. She anticipates further appetite decrease with ongoing chemotherapy.  She sleeps well,  attributing this to Lexapro, and has used Ativan sparingly, taking only three or four doses out of fifteen prescribed. Her bowel movements have stabilized, with no blood in her stool, and she notes more reaction to chemotherapy than radiation in this regard.  She experiences vaginal itching, using Aquaphor in the groin area, which she finds helpful. No fevers, chills, or night sweats are present. She also experiences 'burning mouth' but considers it normal for her. Precautions are taken at home to avoid colds.     I have reviewed the past medical history, past surgical history, social history and family history with the patient and they are unchanged from previous note.  HISTORY OF PRESENT ILLNESS:   ONCOLOGY HISTORY:   Patient presented to her PCP on 11/18/2023 with complaints of rectal itching for couple of weeks and a sensation of incomplete bowel movement, associated with occasional bleeding during/after a bowel movement.  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  Otherwise unremarkable colonoscopy.  On rectal exam in the clinic, similar findings were noted with nonbleeding internal hemorrhoids with additional findings of perirectal erythema and internal erythema but no bright red blood was visualized.  Prescription sent for Proctofoam-HC twice daily.  Apparently she did not get benefit from this medication.   She was referred to GI for further evaluation.   She had consultation with Dr. Federico on 01/11/2024.  She reported that she has been noticing bleeding with every bowel movement.  Also reported some stool leakage on occasion.  On physical exam in the clinic, palpation of the  inside of the anus revealed what felt like a posterior anal fissure and significant surrounding induration in that area.  Anoscopy revealed friable indurated mucosa, internal hemorrhoids and was limited by patient  discomfort.  Recommendation made for urgent colonoscopy.   On 01/18/2024, colonoscopy was performed by Dr. Shila. An infiltrative and ulcerated partially obstructing mass was found in the distal rectum.  The mass was partially circumferential (involving two thirds of the lumen circumference).  The mass measured 4 cm in length.  Oozing was present.  This was biopsied with a cold forceps for histology.  Exam was otherwise normal throughout the examined colon.   Pathology from the distal rectal lesion came back positive for invasive moderately differentiated squamous cell carcinoma.  Immunostains were not obtained.   CT abdomen and pelvis on 01/19/2024 showed irregularity of the rectal wall with an ill-defined 1.5 x 2 cm hypodense lesion, in keeping with known rectal mass. 9 mm lymph node in the posterior pelvis adjacent to the rectosigmoid, concerning for local lymph node involvement.  No evidence of distant metastasis in the abdomen or pelvis.   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   On 02/02/2024, staging PET scan showed intense hypermetabolic activity throughout the anus, consistent with known primary anal carcinoma.  10 mm hypermetabolic left presacral lymph node, consistent with lymph node metastatic disease.  No other sites of metastatic disease was identified.  cT2,cN1,cM0, Stage II B disease.   We will proceed with concurrent chemoradiation using 5-FU/mitomycin using Nigro protocol.  Treatments began on 02/14/2024.  Oncology History  Malignant neoplasm of overlapping sites of rectum, anus and anal canal (HCC)  01/27/2024 Initial Diagnosis   Primary squamous cell carcinoma of anal canal   01/27/2024 Cancer Staging   Staging form: Anus, AJCC V9 - Clinical: Stage IIB (cT2, cN1a, cM0) - Signed by Katrina Millman, MD on 01/27/2024 Method of lymph node assessment: Clinical   02/14/2024 -  Chemotherapy    Patient is on Treatment Plan : ANUS Mitomycin D1,28 + 5FU D1-4, 28-31 q32d         REVIEW OF SYSTEMS:   Review of Systems - Oncology  All other pertinent systems were reviewed with the patient and are negative.  ALLERGIES: She has no known allergies.  MEDICATIONS:  Current Outpatient Medications  Medication Sig Dispense Refill   Adalimumab (HUMIRA PEN) 40 MG/0.4ML PNKT      Ascorbic Acid (VITAMIN C) 1000 MG tablet Take 1,000 mg by mouth daily.     calcium carbonate (OS-CAL) 600 MG TABS Take 600 mg by mouth daily.     cholecalciferol (VITAMIN D ) 1000 UNITS tablet Take by mouth daily.     clonazePAM  (KLONOPIN ) 0.1 mg/mL SUSP Take 5 mLs (0.5 mg total) by mouth 2 (two) times daily as needed. 300 mL 1   denosumab  (PROLIA ) 60 MG/ML SOSY injection Inject 60 mg into the skin every 6 (six) months.     escitalopram (LEXAPRO) 10 MG tablet Take 1 tablet (10 mg total) by mouth daily. 30 tablet 11   folic acid  (FOLVITE ) 1 MG tablet Take 2 mg by mouth daily.     hydrOXYzine (ATARAX) 10 MG tablet Take 1 tablet (10 mg total) by mouth 3 (three) times daily as needed for anxiety. 30 tablet 0   LORazepam (ATIVAN) 0.5 MG tablet Take 1 tablet (0.5 mg total) by mouth at bedtime. 15 tablet 0   magic mouthwash w/lidocaine SOLN Swish  and spit up to 4 times a day as needed for mouth pain. Suspension contains equal amounts of Maalox Extra Strength, nystatin, diphenhydramine and lidocaine. 1200 mL 0   meloxicam (MOBIC) 15 MG tablet Take 1 tablet by mouth as needed.     methotrexate (RHEUMATREX) 2.5 MG tablet Take 15 mg by mouth once a week. Caution:Chemotherapy. Protect from light.  PRN     NON FORMULARY Diltiazem 2%/Lidocaine5% compound Apply to rectum 3 times daily for 8 weeks 30 g 1   ondansetron  (ZOFRAN ) 8 MG tablet Take 1 tablet (8 mg total) by mouth every 8 (eight) hours as needed for nausea or vomiting. 30 tablet 1   oxyCODONE (OXY IR/ROXICODONE) 5 MG immediate release tablet Take 1 tablet (5 mg total) by  mouth every 4 (four) hours as needed for severe pain (pain score 7-10). 60 tablet 0   prochlorperazine  (COMPAZINE ) 10 MG tablet Take 1 tablet (10 mg total) by mouth every 6 (six) hours as needed for nausea or vomiting. 30 tablet 1   traMADol  (ULTRAM ) 50 MG tablet Take 1 tablet (50 mg total) by mouth every 6 (six) hours as needed. 30 tablet 0   No current facility-administered medications for this visit.   Facility-Administered Medications Ordered in Other Visits  Medication Dose Route Frequency Provider Last Rate Last Admin   heparin lock flush 100 unit/mL  500 Units Intravenous Once Allred, Darrell K, PA-C       lidocaine (XYLOCAINE) 1 % (with pres) injection 20 mL  20 mL Intradermal Once Allred, Darrell K, PA-C         VITALS:   Blood pressure 120/75, pulse 87, temperature (!) 97.2 F (36.2 C), resp. rate 20, weight 122 lb 9.6 oz (55.6 kg), last menstrual period 05/04/2002, SpO2 97%.  Wt Readings from Last 3 Encounters:  03/10/24 122 lb 9.6 oz (55.6 kg)  02/18/24 127 lb (57.6 kg)  02/11/24 129 lb 6.4 oz (58.7 kg)    Body mass index is 22.42 kg/m.    Onc Performance Status - 03/10/24 1041       ECOG Perf Status   ECOG Perf Status Fully active, able to carry on all pre-disease performance without restriction      KPS SCALE   KPS % SCORE Normal, no compliants, no evidence of disease           PHYSICAL EXAM:   Physical Exam Constitutional:      General: She is not in acute distress.    Appearance: Normal appearance.  HENT:     Head: Normocephalic and atraumatic.  Cardiovascular:     Rate and Rhythm: Normal rate.  Pulmonary:     Effort: Pulmonary effort is normal. No respiratory distress.  Abdominal:     General: There is no distension.  Musculoskeletal:     Comments: Right arm PICC line in place  Neurological:     General: No focal deficit present.     Mental Status: She is alert and oriented to person, place, and time.  Psychiatric:        Mood and Affect:  Mood normal.        Behavior: Behavior normal.       LABORATORY DATA:   I have reviewed the data as listed.  Results for orders placed or performed in visit on 03/10/24  Rad Onc Aria Session Summary  Result Value Ref Range   Course ID [email or web address]    Course Start Date 02/02/2024    Session Number 20  Course First Treatment Date 02/14/2024  1:53 PM    Course Last Treatment Date 03/10/2024 10:06 AM    Course Elapsed Days 25    Reference Point ID Anus DP    Reference Point Dosage Given to Date 36 Gy   Reference Point Session Dosage Given 1.8 Gy   Plan ID Anus    Plan Fractions Treated to Date 20    Plan Total Fractions Prescribed 30    Plan Prescribed Dose Per Fraction 1.8 Gy   Plan Total Prescribed Dose 54.000000 Gy   Plan Primary Reference Point Anus DP   Results for orders placed or performed in visit on 03/10/24  Magnesium  Result Value Ref Range   Magnesium 1.9 1.7 - 2.4 mg/dL  CMP (Cancer Center only)  Result Value Ref Range   Sodium 138 135 - 145 mmol/L   Potassium 3.9 3.5 - 5.1 mmol/L   Chloride 103 98 - 111 mmol/L   CO2 28 22 - 32 mmol/L   Glucose, Bld 101 (H) 70 - 99 mg/dL   BUN 15 8 - 23 mg/dL   Creatinine 9.40 9.55 - 1.00 mg/dL   Calcium 9.1 8.9 - 89.6 mg/dL   Total Protein 7.1 6.5 - 8.1 g/dL   Albumin 3.7 3.5 - 5.0 g/dL   AST 16 15 - 41 U/L   ALT 11 0 - 44 U/L   Alkaline Phosphatase 51 38 - 126 U/L   Total Bilirubin 0.3 0.0 - 1.2 mg/dL   GFR, Estimated >39 >39 mL/min   Anion gap 7 5 - 15  CBC with Differential (Cancer Center Only)  Result Value Ref Range   WBC Count 5.4 4.0 - 10.5 K/uL   RBC 3.97 3.87 - 5.11 MIL/uL   Hemoglobin 12.6 12.0 - 15.0 g/dL   HCT 62.9 63.9 - 53.9 %   MCV 93.2 80.0 - 100.0 fL   MCH 31.7 26.0 - 34.0 pg   MCHC 34.1 30.0 - 36.0 g/dL   RDW 86.3 88.4 - 84.4 %   Platelet Count 494 (H) 150 - 400 K/uL   nRBC 0.0 0.0 - 0.2 %   Neutrophils Relative % 55 %   Neutro Abs 3.0 1.7 - 7.7 K/uL   Lymphocytes Relative 15 %   Lymphs Abs 0.8  0.7 - 4.0 K/uL   Monocytes Relative 21 %   Monocytes Absolute 1.1 (H) 0.1 - 1.0 K/uL   Eosinophils Relative 6 %   Eosinophils Absolute 0.3 0.0 - 0.5 K/uL   Basophils Relative 1 %   Basophils Absolute 0.0 0.0 - 0.1 K/uL   Immature Granulocytes 2 %   Abs Immature Granulocytes 0.10 (H) 0.00 - 0.07 K/uL      RADIOGRAPHIC STUDIES:  I have personally reviewed the radiological images as listed and agree with the findings in the report.  IR PICC PLACEMENT RIGHT <5 YRS INC IMG GUIDE Result Date: 03/10/2024 INDICATION: Patient with history of anal cancer; central venous access requested for chemotherapy EXAM: RIGHT UPPER EXTREMITY PICC LINE PLACEMENT WITH ULTRASOUND AND FLUOROSCOPIC GUIDANCE MEDICATIONS: 3 mL 1% lidocaine with epinephrine to skin/subcutaneous tissue ANESTHESIA/SEDATION: None FLUOROSCOPY: Radiation Exposure Index (as provided by the fluoroscopic device): 1 mGy Kerma COMPLICATIONS: None immediate. PROCEDURE: The patient was advised of the possible risks and complications and agreed to undergo the procedure. The patient was then brought to the angiographic suite for the procedure. The right arm was prepped with chlorhexidine, draped in the usual sterile fashion using maximum barrier technique (cap and mask,  sterile gown, sterile gloves, large sterile sheet, hand hygiene and cutaneous antisepsis) and infiltrated locally with 1% Lidocaine with epinephrine. Ultrasound demonstrated patency of the right brachial vein, and this was documented with an image. Under real-time ultrasound guidance, this vein was accessed with a 21 gauge micropuncture needle and image documentation was performed. A 0.018 wire was introduced in to the vein. Over this, a 5 French dual lumen power injectable PICC was advanced to the lower SVC/right atrial junction. Fluoroscopy during the procedure and fluoro spot radiograph confirms appropriate catheter position. The catheter was flushed and covered with a isterile dressing.  Catheter length: 34 cm IMPRESSION: Successful right arm power PICC line placement with ultrasound and fluoroscopic guidance. The catheter is ready for use. Performed by: Franky Rakers, PA-C Electronically Signed   By: Marcey Moan M.D.   On: 03/10/2024 09:13   IR PICC PLACEMENT RIGHT >5 YRS INC IMG GUIDE Result Date: 02/11/2024 INDICATION: 68 year old patient presents with history of anal cancer. Received request for placement of peripherally inserted central catheter for chemotherapy treatment. EXAM: RIGHT UPPER EXTREMITY PICC LINE PLACEMENT WITH ULTRASOUND AND FLUOROSCOPIC GUIDANCE MEDICATIONS: 5 mL 1% lidocaine ANESTHESIA/SEDATION: None. FLUOROSCOPY: Radiation Exposure Index (as provided by the fluoroscopic device): 1 mGy Kerma COMPLICATIONS: None immediate. PROCEDURE: The patient was advised of the possible risks and complications and agreed to undergo the procedure. The patient was then brought to the angiographic suite for the procedure. The right arm was prepped with chlorhexidine, draped in the usual sterile fashion using maximum barrier technique (cap and mask, sterile gown, sterile gloves, large sterile sheet, hand hygiene and cutaneous antisepsis) and infiltrated locally with 1% Lidocaine. Ultrasound demonstrated patency of the right basilic vein, and this was documented with an image. Under real-time ultrasound guidance, this vein was accessed with a 21 gauge micropuncture needle and image documentation was performed. A 0.018 wire was introduced in to the vein. Over this, a 5 French double lumen power injectable PICC was advanced to the lower SVC/right atrial junction. Fluoroscopy during the procedure and fluoro spot radiograph confirms appropriate catheter position. The catheter was flushed and covered with a sterile dressing. Catheter length: 39 cm IMPRESSION: Successful right arm power injectable PICC line placement with ultrasound and fluoroscopic guidance. The catheter is ready for use.  Performed by: Kristi Davenport, NP / Juliene Balder, MD Electronically Signed   By: Juliene Balder M.D.   On: 02/11/2024 10:15    CODE STATUS:   No orders of the defined types were placed in this encounter.    Future Appointments  Date Time Provider Department Center  03/13/2024  8:15 AM [facility] INFUSION [facility] None  03/13/2024 11:50 AM [facility] [id number] [facility] None  03/14/2024  1:15 PM [facility] [id number] [facility] None  03/15/2024  1:15 PM [facility] [id number] [facility] None  03/16/2024  1:15 PM [facility] [id number] [facility] None  03/17/2024  8:15 AM [facility] FLUSH [facility] None  03/17/2024  8:45 AM Nariyah Osias, MD [facility] None  03/17/2024  9:00 AM Ivonne Harlene RAMAN, RD [facility] None  03/17/2024  9:15 AM [facility] INFUSION [facility] None  03/17/2024 11:15 AM [facility] LINAC 3 [facility] None  03/20/2024  1:15 PM [facility] [id number] [facility] None  03/21/2024  1:15 PM Maritza Stagger, MD [facility] None  03/22/2024  1:15 PM [facility] LINAC 4 [facility] None  03/23/2024  1:15 PM [facility] LINAC 4 [facility] None  03/24/2024  1:15 PM [facility] LINAC 4 [facility] None  03/28/2024  3:00 PM Benoit, [place], LCSW [facility] None  04/05/2024 11:00  AM [facility] 3 [facility] None  04/06/2024  2:00 PM Helmus, Jitka, PT [facility] None  04/13/2024  2:00 PM Helmus, Jitka, PT [facility] None  04/20/2024  2:00 PM Helmus, Jitka, PT [facility] None  04/24/2024  2:00 PM Helmus, Jitka, PT [facility] None  05/08/2024  2:00 PM Helmus, Jitka, PT [facility] None  06/08/2024  9:10 AM [facility] [address]  06/12/2024  3:00 PM Baxley, Ronal PARAS, MD [facility] [address]      This document was completed utilizing speech recognition software. Grammatical errors, random word insertions, pronoun errors, and incomplete sentences are an occasional consequence of this system due to software limitations, ambient noise, and hardware issues. Any formal questions or concerns  about the content, text or information contained within the body of this dictation should be directly addressed to the provider for clarification.

## 2024-03-10 NOTE — Assessment & Plan Note (Addendum)
 Please review oncology history for additional details and timeline of events.  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 Biopsies confirm squamous cell carcinoma.   CT abdomen and pelvis on 01/19/2024 showed irregularity of the rectal wall with an ill-defined 1.5 x 2 cm hypodense lesion, in keeping with known rectal mass. 9 mm lymph node in the posterior pelvis adjacent to the rectosigmoid, concerning for local lymph node involvement.  No evidence of distant metastasis in the abdomen or pelvis.  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    On 02/02/2024, staging PET scan showed intense hypermetabolic activity throughout the anus, consistent with known primary anal carcinoma.  10 mm hypermetabolic left presacral lymph node, consistent with lymph node metastatic disease.  No other sites of metastatic disease was identified.  cT2,cN1,cM0, Stage II B disease.   We will proceed with concurrent chemoradiation using 5-FU/mitomycin using Nigro protocol.  Treatments began on 02/14/2024. Chemotherapy will be given via PICC line on days 1-4 and days 29-32. We will monitor for treatment side effects such as nausea, diarrhea, low blood counts, fatigue, altered taste, and hair thinning, and manage accordingly.  She tolerated the first cycle of 5-FU and mitomycin well.  Labs today reveal no dose-limiting toxicities.  Overall she is doing very well clinically. She reports improvement in pain from the tumor, indicating a positive response to the treatment.   She had a PICC line placed again today.  Labs today reveal no dose-limiting toxicities.  She will receive her next cycle of chemotherapy starting on 03/13/2024.  She will continue radiation treatments as scheduled.  - Continue using Aquaphor for vaginal itching as needed.  I will see her in 1 week for toxicity  evaluation.

## 2024-03-10 NOTE — Procedures (Signed)
 Right double-lumen brachial vein PICC placed.  Length 34 cm.  Tip SVC/right atrial junction.  Medication used-3 mL 1% lidocaine with epinephrine to skin and subcutaneous tissue.  EBL less than 2 cc. Ok to use.

## 2024-03-10 NOTE — Assessment & Plan Note (Addendum)
 Rheumatoid arthritis is well-managed with methotrexate and Humira. Chemotherapy may have a beneficial effect on RA symptoms. - Pause Humira and methotrexate during chemotherapy treatment.

## 2024-03-11 ENCOUNTER — Ambulatory Visit

## 2024-03-11 ENCOUNTER — Ambulatory Visit: Admitting: Radiation Oncology

## 2024-03-13 ENCOUNTER — Other Ambulatory Visit: Payer: Self-pay

## 2024-03-13 ENCOUNTER — Inpatient Hospital Stay

## 2024-03-13 ENCOUNTER — Ambulatory Visit
Admission: RE | Admit: 2024-03-13 | Discharge: 2024-03-13 | Disposition: A | Discharge status: Home / Self Care | Source: Ambulatory Visit | Attending: Radiation Oncology | Admitting: Radiation Oncology

## 2024-03-13 VITALS — BP 107/75 | HR 83 | Temp 98.0°F | Resp 18

## 2024-03-13 DIAGNOSIS — Z5111 Encounter for antineoplastic chemotherapy: Secondary | ICD-10-CM | POA: Diagnosis not present

## 2024-03-13 DIAGNOSIS — C218 Malignant neoplasm of overlapping sites of rectum, anus and anal canal: Secondary | ICD-10-CM

## 2024-03-13 DIAGNOSIS — Z510 Encounter for antineoplastic radiation therapy: Secondary | ICD-10-CM | POA: Diagnosis not present

## 2024-03-13 LAB — RAD ONC ARIA SESSION SUMMARY
Course Elapsed Days: 28
Plan Fractions Treated to Date: 21
Plan Prescribed Dose Per Fraction: 1.8 Gy
Plan Total Fractions Prescribed: 30
Plan Total Prescribed Dose: 54 Gy
Reference Point Dosage Given to Date: 37.8 Gy
Reference Point Session Dosage Given: 1.8 Gy
Session Number: 21

## 2024-03-13 MED ORDER — PROCHLORPERAZINE MALEATE 10 MG PO TABS
10.0000 mg | ORAL_TABLET | Freq: Once | ORAL | Status: AC
  Administered 2024-03-13: 10 mg via ORAL
  Filled 2024-03-13: qty 1

## 2024-03-13 MED ORDER — SODIUM CHLORIDE 0.9 % IV SOLN: INTRAVENOUS | Status: DC

## 2024-03-13 MED ORDER — MITOMYCIN CHEMO IV INJECTION 20 MG
10.0000 mg/m2 | Freq: Once | INTRAVENOUS | Status: AC
  Administered 2024-03-13: 16 mg via INTRAVENOUS
  Filled 2024-03-13: qty 32

## 2024-03-13 MED ORDER — SODIUM CHLORIDE 0.9 % IV SOLN
1000.0000 mg/m2/d | INTRAVENOUS | Status: DC
  Administered 2024-03-13: 7000 mg via INTRAVENOUS
  Filled 2024-03-13: qty 140

## 2024-03-13 NOTE — Patient Instructions (Signed)
 [facility] - A DEPT OF [facility]  Discharge Instructions: Thank you for choosing [facility] to provide your oncology and hematology care.   If you have a lab appointment with the Cancer Center, please go directly to the Cancer Center and check in at the registration area.   Wear comfortable clothing and clothing appropriate for easy access to any Portacath or PICC line.   We strive to give you quality time with your provider. You may need to reschedule your appointment if you arrive late (15 or more minutes).  Arriving late affects you and other patients whose appointments are after yours.  Also, if you miss three or more appointments without notifying the office, you may be dismissed from the clinic at the provider's discretion.      For prescription refill requests, have your pharmacy contact our office and allow 72 hours for refills to be completed.    Today you received the following chemotherapy and/or immunotherapy agents: mitomycin and fluorouracil      To help prevent nausea and vomiting after your treatment, we encourage you to take your nausea medication as directed.  BELOW ARE SYMPTOMS THAT SHOULD BE REPORTED IMMEDIATELY: *FEVER GREATER THAN 100.4 F (38 C) OR HIGHER *CHILLS OR SWEATING *NAUSEA AND VOMITING THAT IS NOT CONTROLLED WITH YOUR NAUSEA MEDICATION *UNUSUAL SHORTNESS OF BREATH *UNUSUAL BRUISING OR BLEEDING *URINARY PROBLEMS (pain or burning when urinating, or frequent urination) *BOWEL PROBLEMS (unusual diarrhea, constipation, pain near the anus) TENDERNESS IN MOUTH AND THROAT WITH OR WITHOUT PRESENCE OF ULCERS (sore throat, sores in mouth, or a toothache) UNUSUAL RASH, SWELLING OR PAIN  UNUSUAL VAGINAL DISCHARGE OR ITCHING   Items with * indicate a potential emergency and should be followed up as soon as possible or go to the Emergency Department if any problems should occur.  Please show the CHEMOTHERAPY ALERT CARD  or IMMUNOTHERAPY ALERT CARD at check-in to the Emergency Department and triage nurse.  Should you have questions after your visit or need to cancel or reschedule your appointment, please contact [facility] - A DEPT OF [facility]  Dept: [phone]  and follow the prompts.  Office hours are 8:00 a.m. to 4:30 p.m. Monday - Friday. Please note that voicemails left after 4:00 p.m. may not be returned until the following business day.  We are closed weekends and major holidays. You have access to a nurse at all times for urgent questions. Please call the main number to the clinic Dept: [phone] and follow the prompts.   For any non-urgent questions, you may also contact your provider using MyChart. We now offer e-Visits for anyone 70 and older to request care online for non-urgent symptoms. For details visit [email or web address].   Also download the MyChart app! Go to the app store, search MyChart, open the app, select [place], and log in with your MyChart username and password.

## 2024-03-14 ENCOUNTER — Ambulatory Visit
Admission: RE | Admit: 2024-03-14 | Discharge: 2024-03-14 | Disposition: A | Discharge status: Home / Self Care | Source: Ambulatory Visit | Attending: Radiation Oncology | Admitting: Radiation Oncology

## 2024-03-14 ENCOUNTER — Other Ambulatory Visit: Payer: Self-pay

## 2024-03-14 DIAGNOSIS — Z510 Encounter for antineoplastic radiation therapy: Secondary | ICD-10-CM | POA: Diagnosis not present

## 2024-03-14 LAB — RAD ONC ARIA SESSION SUMMARY
Course Elapsed Days: 29
Plan Fractions Treated to Date: 22
Plan Prescribed Dose Per Fraction: 1.8 Gy
Plan Total Fractions Prescribed: 30
Plan Total Prescribed Dose: 54 Gy
Reference Point Dosage Given to Date: 39.6 Gy
Reference Point Session Dosage Given: 1.8 Gy
Session Number: 22

## 2024-03-15 ENCOUNTER — Ambulatory Visit
Admission: RE | Admit: 2024-03-15 | Discharge: 2024-03-15 | Disposition: A | Discharge status: Home / Self Care | Source: Ambulatory Visit | Attending: Radiation Oncology | Admitting: Radiation Oncology

## 2024-03-15 ENCOUNTER — Other Ambulatory Visit: Payer: Self-pay

## 2024-03-15 ENCOUNTER — Ambulatory Visit

## 2024-03-15 DIAGNOSIS — Z510 Encounter for antineoplastic radiation therapy: Secondary | ICD-10-CM | POA: Diagnosis not present

## 2024-03-15 LAB — RAD ONC ARIA SESSION SUMMARY
Course Elapsed Days: 30
Plan Fractions Treated to Date: 23
Plan Prescribed Dose Per Fraction: 1.8 Gy
Plan Total Fractions Prescribed: 30
Plan Total Prescribed Dose: 54 Gy
Reference Point Dosage Given to Date: 41.4 Gy
Reference Point Session Dosage Given: 1.8 Gy
Session Number: 23

## 2024-03-16 ENCOUNTER — Other Ambulatory Visit: Payer: Self-pay

## 2024-03-16 ENCOUNTER — Ambulatory Visit
Admission: RE | Admit: 2024-03-16 | Discharge: 2024-03-16 | Disposition: A | Discharge status: Home / Self Care | Source: Ambulatory Visit | Attending: Radiation Oncology | Admitting: Radiation Oncology

## 2024-03-16 ENCOUNTER — Ambulatory Visit
Admission: RE | Admit: 2024-03-16 | Discharge: 2024-03-16 | Disposition: A | Source: Ambulatory Visit | Attending: Radiation Oncology

## 2024-03-16 DIAGNOSIS — C218 Malignant neoplasm of overlapping sites of rectum, anus and anal canal: Secondary | ICD-10-CM

## 2024-03-16 DIAGNOSIS — Z510 Encounter for antineoplastic radiation therapy: Secondary | ICD-10-CM | POA: Diagnosis not present

## 2024-03-16 DIAGNOSIS — C44520 Squamous cell carcinoma of anal skin: Secondary | ICD-10-CM

## 2024-03-16 LAB — RAD ONC ARIA SESSION SUMMARY
Course Elapsed Days: 31
Plan Fractions Treated to Date: 24
Plan Prescribed Dose Per Fraction: 1.8 Gy
Plan Total Fractions Prescribed: 30
Plan Total Prescribed Dose: 54 Gy
Reference Point Dosage Given to Date: 43.2 Gy
Reference Point Session Dosage Given: 1.8 Gy
Session Number: 24

## 2024-03-16 MED ORDER — ALUM SULFATE-CA ACETATE EX PACK: 1.0000 | PACK | Freq: Three times a day (TID) | CUTANEOUS | 0 refills | Status: AC

## 2024-03-17 ENCOUNTER — Inpatient Hospital Stay

## 2024-03-17 ENCOUNTER — Other Ambulatory Visit: Payer: Self-pay

## 2024-03-17 ENCOUNTER — Ambulatory Visit
Admission: RE | Admit: 2024-03-17 | Discharge: 2024-03-17 | Disposition: A | Discharge status: Home / Self Care | Source: Ambulatory Visit | Attending: Radiation Oncology | Admitting: Radiation Oncology

## 2024-03-17 ENCOUNTER — Encounter: Payer: Self-pay | Admitting: Radiation Oncology

## 2024-03-17 ENCOUNTER — Encounter: Payer: Self-pay | Admitting: Oncology

## 2024-03-17 ENCOUNTER — Inpatient Hospital Stay: Admitting: Oncology

## 2024-03-17 ENCOUNTER — Inpatient Hospital Stay: Admitting: Dietician

## 2024-03-17 VITALS — BP 120/68 | HR 82 | Temp 98.7°F | Resp 16

## 2024-03-17 VITALS — BP 114/75 | HR 96 | Temp 97.9°F | Resp 17 | Ht 62.0 in | Wt 123.8 lb

## 2024-03-17 DIAGNOSIS — C44520 Squamous cell carcinoma of anal skin: Secondary | ICD-10-CM

## 2024-03-17 DIAGNOSIS — C218 Malignant neoplasm of overlapping sites of rectum, anus and anal canal: Secondary | ICD-10-CM

## 2024-03-17 DIAGNOSIS — Z510 Encounter for antineoplastic radiation therapy: Secondary | ICD-10-CM | POA: Diagnosis not present

## 2024-03-17 DIAGNOSIS — Z5111 Encounter for antineoplastic chemotherapy: Secondary | ICD-10-CM | POA: Diagnosis not present

## 2024-03-17 LAB — CMP (CANCER CENTER ONLY)
ALT: 23 U/L (ref 0–44)
AST: 23 U/L (ref 15–41)
Albumin: 3.4 g/dL — ABNORMAL LOW (ref 3.5–5.0)
Alkaline Phosphatase: 46 U/L (ref 38–126)
Anion gap: 7 (ref 5–15)
BUN: 13 mg/dL (ref 8–23)
CO2: 27 mmol/L (ref 22–32)
Calcium: 8.7 mg/dL — ABNORMAL LOW (ref 8.9–10.3)
Chloride: 104 mmol/L (ref 98–111)
Creatinine: 0.5 mg/dL (ref 0.44–1.00)
GFR, Estimated: >60 mL/min (ref >60)
Glucose, Bld: 116 mg/dL — ABNORMAL HIGH (ref 70–99)
Potassium: 3.2 mmol/L — ABNORMAL LOW (ref 3.5–5.1)
Sodium: 138 mmol/L (ref 135–145)
Total Bilirubin: 0.5 mg/dL (ref 0.0–1.2)
Total Protein: 6.3 g/dL — ABNORMAL LOW (ref 6.5–8.1)

## 2024-03-17 LAB — CBC WITH DIFFERENTIAL (CANCER CENTER ONLY)
Abs Immature Granulocytes: 0.01 K/uL (ref 0.00–0.07)
Basophils Absolute: 0 K/uL (ref 0.0–0.1)
Basophils Relative: 1 %
Eosinophils Absolute: 0.5 K/uL (ref 0.0–0.5)
Eosinophils Relative: 8 %
HCT: 34.3 % — ABNORMAL LOW (ref 36.0–46.0)
Hemoglobin: 11.7 g/dL — ABNORMAL LOW (ref 12.0–15.0)
Immature Granulocytes: 0 %
Lymphocytes Relative: 5 %
Lymphs Abs: 0.3 K/uL — ABNORMAL LOW (ref 0.7–4.0)
MCH: 32 pg (ref 26.0–34.0)
MCHC: 34.1 g/dL (ref 30.0–36.0)
MCV: 93.7 fL (ref 80.0–100.0)
Monocytes Absolute: 0.1 K/uL (ref 0.1–1.0)
Monocytes Relative: 2 %
Neutro Abs: 4.8 K/uL (ref 1.7–7.7)
Neutrophils Relative %: 84 %
Platelet Count: 272 K/uL (ref 150–400)
RBC: 3.66 MIL/uL — ABNORMAL LOW (ref 3.87–5.11)
RDW: 13.6 % (ref 11.5–15.5)
WBC Count: 5.7 K/uL (ref 4.0–10.5)
nRBC: 0 % (ref 0.0–0.2)

## 2024-03-17 LAB — RAD ONC ARIA SESSION SUMMARY
Course Elapsed Days: 32
Plan Fractions Treated to Date: 25
Plan Prescribed Dose Per Fraction: 1.8 Gy
Plan Total Fractions Prescribed: 30
Plan Total Prescribed Dose: 54 Gy
Reference Point Dosage Given to Date: 45 Gy
Reference Point Session Dosage Given: 1.8 Gy
Session Number: 25

## 2024-03-17 LAB — MAGNESIUM: Magnesium: 1.8 mg/dL (ref 1.7–2.4)

## 2024-03-17 MED ORDER — PROCHLORPERAZINE MALEATE 10 MG PO TABS
10.0000 mg | ORAL_TABLET | Freq: Once | ORAL | Status: AC
  Administered 2024-03-17: 10 mg via ORAL

## 2024-03-17 NOTE — Progress Notes (Signed)
 Pt had a an infusion pump running and was unable to collect labs. Sent pt to her next appt with Dr Autumn and notified Charge RN Ronal.

## 2024-03-17 NOTE — Progress Notes (Signed)
 [facility]  ONCOLOGY CLINIC PROGRESS NOTE   Patient Care Team: Baxley, Ronal PARAS, MD as PCP - General (Internal Medicine) [name] [name], MD as Consulting Physician (Obstetrics and Gynecology) Nandigam, Kavitha V, MD as Consulting Physician (Gastroenterology) Dewey Rush, MD as Consulting Physician (Radiation Oncology) Autumn Millman, MD as Consulting Physician (Oncology)  PATIENT NAME: Katrina [name]   MR#: [id number] DOB: Jul 04, 1955  Date of visit: 03/17/2024   ASSESSMENT & PLAN:   Katrina [name] is a 68 y.o. 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   Malignant neoplasm of overlapping sites of rectum, anus and anal canal [facility] Please review oncology history for additional details and timeline of events.  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 Biopsies confirm squamous cell carcinoma.   CT abdomen and pelvis on 01/19/2024 showed irregularity of the rectal wall with an ill-defined 1.5 x 2 cm hypodense lesion, in keeping with known rectal mass. 9 mm lymph node in the posterior pelvis adjacent to the rectosigmoid, concerning for local lymph node involvement.  No evidence of distant metastasis in the abdomen or pelvis.  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    On 02/02/2024, staging PET scan showed intense hypermetabolic activity throughout the anus, consistent with known primary anal carcinoma.  10 mm hypermetabolic left presacral lymph node, consistent with lymph node metastatic disease.  No other sites of metastatic disease was identified.  cT2,cN1,cM0, Stage II B disease.   We will proceed with  concurrent chemoradiation using 5-FU/mitomycin using Nigro protocol.  Treatments began on 02/14/2024. Chemotherapy will be given via PICC line on days 1-4 and days 29-32. We will monitor for treatment side effects such as nausea, diarrhea, low blood counts, fatigue, altered taste, and hair thinning, and manage accordingly.  She tolerated the first cycle of 5-FU and mitomycin well.  Labs today reveal no dose-limiting toxicities.  Overall she is doing very well clinically. She reports improvement in pain from the tumor, indicating a positive response to the treatment.   She started day 29 of chemotherapy on 03/13/2024.  Tolerating treatments fairly well except for expected side effects including decreased appetite, nausea and some soreness in the site of radiation.  Labs today reveal no dose-limiting toxicities.  She was advised to continue radiation treatments as planned. Last day of radiation scheduled for November 21st, 2025.  - Continue using Aquaphor for vaginal itching as needed.  I will see her in 4 weeks for follow-up with repeat labs.  Chemotherapy and radiation-induced anorexia and nausea Experiencing decreased appetite and one episode of vomiting after eating. Symptoms are within expected range for treatment. 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 Symptoms are intense during bowel movements. Using Domeboro sitz baths as recommended by Dr. Maritza. - Continue Domeboro sitz baths as needed for symptom relief  Rheumatoid arthritis, stable off therapy during cancer treatment No new joint issues reported, only transient knee soreness resolved with a sleeve. - Will reassess RA medication needs after completion of cancer treatment in four weeks - Will inform rheumatologist of treatment status  Situational insomnia Sleeping well  with Lexapro and minimal  use of Ativan.  I reviewed lab results and outside records for this visit and discussed relevant results with the patient. Diagnosis, plan of care and treatment options were also discussed in detail with the patient. Opportunity provided to ask questions and answers provided to her apparent satisfaction. Provided instructions to call our clinic with any problems, questions or concerns prior to return visit. I recommended to continue follow-up with PCP and sub-specialists. She verbalized understanding and agreed with the plan.   NCCN guidelines have been consulted in the planning of this patient's care.  I spent a total of 40 minutes during this encounter with the patient including review of chart and various tests results, discussions about plan of care and coordination of care plan.  Chinita Patten, MD  03/17/2024 5:11 PM  [facility] - A DEPT OF [facility] 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  History of Present Illness  Katrina [name] is a 68 year old female undergoing chemotherapy and radiation therapy who presents for follow-up of treatment side effects.  She is currently undergoing chemotherapy and radiation therapy, with her last day of radiation scheduled for March 24, 2024. She experiences a lack of appetite and had one episode of vomiting after eating last night. No other episodes of vomiting have occurred.  She reports intense pain and discomfort primarily during bathroom use and has a raw area. She has been using Domeboro sitz baths to  manage this issue, completing one sitz bath last night, which has provided some relief.  She has not developed any mouth sores yet. She experienced sores a couple of weeks after treatment in the past and is prepared with 'magic mouthwash' and other rinses.  Regarding her rheumatoid arthritis, she experienced some knee soreness, which resolved after wearing a sleeve. She is not currently experiencing any new joint issues.  She had COVID-19 in mid-August 2025, and she and her sister have been diligent about receiving annual vaccinations. She plans to receive her next COVID-19 vaccine in mid-December.     I have reviewed the past medical history, past surgical history, social history and family history with the patient and they are unchanged from previous note.  HISTORY OF PRESENT ILLNESS:   ONCOLOGY HISTORY:   Patient presented to her PCP on 11/18/2023 with complaints of rectal itching for couple of weeks and a sensation of incomplete bowel movement, associated with occasional bleeding during/after a bowel movement.  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  Otherwise unremarkable colonoscopy.  On rectal exam in the clinic, similar findings were noted with nonbleeding internal hemorrhoids with additional findings of perirectal erythema and internal erythema but no bright red blood was visualized.  Prescription sent for Proctofoam-HC twice daily.  Apparently she did not get benefit from this medication.   She was referred to GI for further evaluation.   She had consultation with Dr. Federico on 01/11/2024.  She reported that she has been noticing bleeding with every bowel  movement.  Also reported some stool leakage on occasion.  On physical exam in the clinic, palpation of the inside of the anus revealed what felt like a posterior anal fissure and significant surrounding induration in that  area.  Anoscopy revealed friable indurated mucosa, internal hemorrhoids and was limited by patient discomfort.  Recommendation made for urgent colonoscopy.   On 01/18/2024, colonoscopy was performed by Dr. Shila. An infiltrative and ulcerated partially obstructing mass was found in the distal rectum.  The mass was partially circumferential (involving two thirds of the lumen circumference).  The mass measured 4 cm in length.  Oozing was present.  This was biopsied with a cold forceps for histology.  Exam was otherwise normal throughout the examined colon.   Pathology from the distal rectal lesion came back positive for invasive moderately differentiated squamous cell carcinoma.  Immunostains were not obtained.   CT abdomen and pelvis on 01/19/2024 showed irregularity of the rectal wall with an ill-defined 1.5 x 2 cm hypodense lesion, in keeping with known rectal mass. 9 mm lymph node in the posterior pelvis adjacent to the rectosigmoid, concerning for local lymph node involvement.  No evidence of distant metastasis in the abdomen or pelvis.   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   On 02/02/2024, staging PET scan showed intense hypermetabolic activity throughout the anus, consistent with known primary anal carcinoma.  10 mm hypermetabolic left presacral lymph node, consistent with lymph node metastatic disease.  No other sites of metastatic disease was identified.  cT2,cN1,cM0, Stage II B disease.   We will proceed with concurrent chemoradiation using 5-FU/mitomycin using Nigro protocol.  Treatments began on 02/14/2024.  Oncology History  Malignant neoplasm of overlapping sites of rectum, anus and anal canal (HCC)  01/27/2024 Initial Diagnosis   Primary squamous cell carcinoma of anal canal   01/27/2024 Cancer Staging   Staging form: Anus, AJCC V9 - Clinical: Stage IIB (cT2, cN1a, cM0) - Signed by Autumn Millman, MD on 01/27/2024 Method of lymph node assessment: Clinical   02/14/2024 -  Chemotherapy   Patient is on Treatment Plan : ANUS Mitomycin D1,28 + 5FU D1-4, 28-31 q32d         REVIEW OF SYSTEMS:   Review of Systems - Oncology  All other pertinent systems were reviewed with the patient and are negative.  ALLERGIES: She has no known allergies.  MEDICATIONS:  Current Outpatient Medications  Medication Sig Dispense Refill   Adalimumab (HUMIRA PEN) 40 MG/0.4ML PNKT      aluminum sulfate-calcium acetate (DOMEBORO) packet Apply 1 packet topically 3 (three) times daily. 100 each 0   Ascorbic Acid (VITAMIN C) 1000 MG tablet Take 1,000 mg by mouth daily.     calcium carbonate (OS-CAL) 600 MG TABS Take 600 mg by mouth daily.     cholecalciferol (VITAMIN D ) 1000 UNITS tablet Take by mouth daily.     clonazePAM  (KLONOPIN ) 0.1 mg/mL SUSP Take 5 mLs (0.5 mg total) by mouth 2 (two) times daily as needed. 300 mL 1   denosumab  (PROLIA ) 60 MG/ML SOSY injection Inject 60 mg into the skin every 6 (six) months.     escitalopram (LEXAPRO) 10 MG tablet Take 1 tablet (10 mg total) by mouth daily. 30 tablet 11   folic acid  (FOLVITE ) 1 MG tablet Take 2 mg by mouth daily.     hydrOXYzine (ATARAX) 10 MG tablet Take 1 tablet (10 mg total) by mouth 3 (  three) times daily as needed for anxiety. 30 tablet 0   LORazepam (ATIVAN) 0.5 MG tablet Take 1 tablet (0.5 mg total) by mouth at bedtime. 15 tablet 0   magic mouthwash w/lidocaine SOLN Swish and spit up to 4 times a day as needed for mouth pain. Suspension contains equal amounts of Maalox Extra Strength, nystatin, diphenhydramine and lidocaine. 1200 mL 0   meloxicam (MOBIC) 15 MG tablet Take 1 tablet by mouth as needed.     methotrexate (RHEUMATREX) 2.5 MG tablet Take 15 mg by mouth once a week. Caution:Chemotherapy. Protect from light.  PRN     NON FORMULARY Diltiazem 2%/Lidocaine5% compound Apply to rectum 3 times daily for 8 weeks 30 g 1   ondansetron   (ZOFRAN ) 8 MG tablet Take 1 tablet (8 mg total) by mouth every 8 (eight) hours as needed for nausea or vomiting. 30 tablet 1   oxyCODONE (OXY IR/ROXICODONE) 5 MG immediate release tablet Take 1 tablet (5 mg total) by mouth every 4 (four) hours as needed for severe pain (pain score 7-10). 60 tablet 0   prochlorperazine  (COMPAZINE ) 10 MG tablet Take 1 tablet (10 mg total) by mouth every 6 (six) hours as needed for nausea or vomiting. 30 tablet 1   traMADol  (ULTRAM ) 50 MG tablet Take 1 tablet (50 mg total) by mouth every 6 (six) hours as needed. 30 tablet 0   No current facility-administered medications for this visit.     VITALS:   Blood pressure 114/75, pulse 96, temperature 97.9 F (36.6 C), temperature source Temporal, resp. rate 17, height 5' 2 (1.575 m), weight 123 lb 12.8 oz (56.2 kg), last menstrual period 05/04/2002, SpO2 98%.  Wt Readings from Last 3 Encounters:  03/17/24 123 lb 12.8 oz (56.2 kg)  03/10/24 122 lb 9.6 oz (55.6 kg)  02/18/24 127 lb (57.6 kg)    Body mass index is 22.64 kg/m.    Onc Performance Status - 03/17/24 0800       ECOG Perf Status   ECOG Perf Status Fully active, able to carry on all pre-disease performance without restriction      KPS SCALE   KPS % SCORE Normal, no compliants, no evidence of disease           PHYSICAL EXAM:   Physical Exam Constitutional:      General: She is not in acute distress.    Appearance: Normal appearance.  HENT:     Head: Normocephalic and atraumatic.  Cardiovascular:     Rate and Rhythm: Normal rate.  Pulmonary:     Effort: Pulmonary effort is normal. No respiratory distress.  Abdominal:     General: There is no distension.  Musculoskeletal:     Comments: Right arm PICC line in place  Neurological:     General: No focal deficit present.     Mental Status: She is alert and oriented to person, place, and time.  Psychiatric:        Mood and Affect: Mood normal.        Behavior: Behavior normal.        LABORATORY DATA:   I have reviewed the data as listed.  Results for orders placed or performed in visit on 03/17/24  Rad Onc Aria Session Summary  Result Value Ref Range   Course ID [email or web address]    Course Start Date 02/02/2024    Session Number 25    Course First Treatment Date 02/14/2024  1:53 PM    Course Last Treatment Date 03/17/2024  10:56 AM    Course Elapsed Days 32    Reference Point ID Anus DP    Reference Point Dosage Given to Date 45 Gy   Reference Point Session Dosage Given 1.8 Gy   Plan ID Anus    Plan Fractions Treated to Date 25    Plan Total Fractions Prescribed 30    Plan Prescribed Dose Per Fraction 1.8 Gy   Plan Total Prescribed Dose 54.000000 Gy   Plan Primary Reference Point Anus DP   Results for orders placed or performed in visit on 03/17/24  CBC with Differential (Cancer Center Only)  Result Value Ref Range   WBC Count 5.7 4.0 - 10.5 K/uL   RBC 3.66 (L) 3.87 - 5.11 MIL/uL   Hemoglobin 11.7 (L) 12.0 - 15.0 g/dL   HCT 65.6 (L) 63.9 - 53.9 %   MCV 93.7 80.0 - 100.0 fL   MCH 32.0 26.0 - 34.0 pg   MCHC 34.1 30.0 - 36.0 g/dL   RDW 86.3 88.4 - 84.4 %   Platelet Count 272 150 - 400 K/uL   nRBC 0.0 0.0 - 0.2 %   Neutrophils Relative % 84 %   Neutro Abs 4.8 1.7 - 7.7 K/uL   Lymphocytes Relative 5 %   Lymphs Abs 0.3 (L) 0.7 - 4.0 K/uL   Monocytes Relative 2 %   Monocytes Absolute 0.1 0.1 - 1.0 K/uL   Eosinophils Relative 8 %   Eosinophils Absolute 0.5 0.0 - 0.5 K/uL   Basophils Relative 1 %   Basophils Absolute 0.0 0.0 - 0.1 K/uL   Immature Granulocytes 0 %   Abs Immature Granulocytes 0.01 0.00 - 0.07 K/uL  Magnesium  Result Value Ref Range   Magnesium 1.8 1.7 - 2.4 mg/dL  CMP (Cancer Center only)  Result Value Ref Range   Sodium 138 135 - 145 mmol/L   Potassium 3.2 (L) 3.5 - 5.1 mmol/L   Chloride 104 98 - 111 mmol/L   CO2 27 22 - 32 mmol/L   Glucose, Bld 116 (H) 70 - 99 mg/dL   BUN 13 8 - 23 mg/dL   Creatinine 9.49 9.55 - 1.00 mg/dL   Calcium  8.7 (L) 8.9 - 10.3 mg/dL   Total Protein 6.3 (L) 6.5 - 8.1 g/dL   Albumin 3.4 (L) 3.5 - 5.0 g/dL   AST 23 15 - 41 U/L   ALT 23 0 - 44 U/L   Alkaline Phosphatase 46 38 - 126 U/L   Total Bilirubin 0.5 0.0 - 1.2 mg/dL   GFR, Estimated >39 >39 mL/min   Anion gap 7 5 - 15       RADIOGRAPHIC STUDIES:  I have personally reviewed the radiological images as listed and agree with the findings in the report.  IR PICC PLACEMENT RIGHT <5 YRS INC IMG GUIDE Result Date: 03/10/2024 INDICATION: Patient with history of anal cancer; central venous access requested for chemotherapy EXAM: RIGHT UPPER EXTREMITY PICC LINE PLACEMENT WITH ULTRASOUND AND FLUOROSCOPIC GUIDANCE MEDICATIONS: 3 mL 1% lidocaine with epinephrine to skin/subcutaneous tissue ANESTHESIA/SEDATION: None FLUOROSCOPY: Radiation Exposure Index (as provided by the fluoroscopic device): 1 mGy Kerma COMPLICATIONS: None immediate. PROCEDURE: The patient was advised of the possible risks and complications and agreed to undergo the procedure. The patient was then brought to the angiographic suite for the procedure. The right arm was prepped with chlorhexidine, draped in the usual sterile fashion using maximum barrier technique (cap and mask, sterile gown, sterile gloves, large sterile sheet, hand hygiene and  cutaneous antisepsis) and infiltrated locally with 1% Lidocaine with epinephrine. Ultrasound demonstrated patency of the right brachial vein, and this was documented with an image. Under real-time ultrasound guidance, this vein was accessed with a 21 gauge micropuncture needle and image documentation was performed. A 0.018 wire was introduced in to the vein. Over this, a 5 French dual lumen power injectable PICC was advanced to the lower SVC/right atrial junction. Fluoroscopy during the procedure and fluoro spot radiograph confirms appropriate catheter position. The catheter was flushed and covered with a isterile dressing. Catheter length: 34 cm  IMPRESSION: Successful right arm power PICC line placement with ultrasound and fluoroscopic guidance. The catheter is ready for use. Performed by: Franky Rakers, PA-C Electronically Signed   By: Marcey Moan M.D.   On: 03/10/2024 09:13    Orders Placed This Encounter  Procedures   CBC with Differential (Cancer Center Only)    Standing Status:   Future    Expiration Date:   03/17/2025   CMP (Cancer Center only)    Standing Status:   Future    Expiration Date:   03/17/2025   Magnesium    Standing Status:   Future    Expiration Date:   03/17/2025     Future Appointments  Date Time Provider Department Center  03/20/2024  1:15 PM [facility] [id number] [facility] None  03/21/2024  1:15 PM Maritza Stagger, MD [facility] None  03/22/2024  1:15 PM [facility] LINAC 4 [facility] None  03/22/2024  1:30 PM [facility] None  03/23/2024  1:15 PM [facility] LINAC 4 [facility] None  03/24/2024 11:15 AM [facility] LINAC 4 [facility] None  03/28/2024  3:00 PM [place], [place], LCSW [facility] None  04/05/2024 11:00 AM [facility] None  04/06/2024  2:00 PM Helmus, Jitka, PT [facility] None  04/13/2024  2:00 PM Helmus, Jitka, PT [facility] None  04/20/2024  2:00 PM Helmus, Jitka, PT [facility] None  04/24/2024  2:00 PM Helmus, Jitka, PT [facility] None  05/08/2024  2:00 PM Helmus, Jitka, PT [facility] None  06/08/2024  9:10 AM [facility] [address]  06/12/2024  3:00 PM Baxley, Ronal PARAS, MD [facility] [address]     This document was completed utilizing speech recognition software. Grammatical errors, random word insertions, pronoun errors, and incomplete sentences are an occasional consequence of this system due to software limitations, ambient noise, and hardware issues. Any formal questions or concerns about the content, text or information contained within the body of this dictation should be directly addressed to the provider for clarification.

## 2024-03-17 NOTE — Progress Notes (Signed)
 Nutrition Follow-up:  Patient with SCC of anal canal. She is receiving concurrent chemoradiation with mitomycin/5FU. Final RT planned 11/21. She is under the care of Dr. Autumn   Met with patient and husband in infusion during PICC line removal. Patient tolerating treatment well overall. Appetite is decreased. Reports foods are not appealing and stomach is sensitive. Recalls episode of vomiting after eating [employer] last night. This morning, had turkey sausage and egg omelette. Patient has been drinking CIB with fairlife milk which she likes and tolerates well. Using sitz bath for relief from radiation. Patient reports now wearing depends due to incontinence.      Medications: reviewed  Labs: K 3.2, glucose 116, albumin 3.4  Anthropometrics: Wt 123 lb 12.8 oz today   11/7 - 122 lb 9.6 oz 10/17 - 127 lb  10/10 - 129 lb 6.4 oz  9/29 - 127 lb 8 oz    Estimated Energy Needs  Kcals: 1800-2000 Protein: 71-89 Fluid: >/= 2L  NUTRITION DIAGNOSIS: Food and nutrition related knowledge deficit improved   INTERVENTION:  Continue CIB with fairlife milk - educated to increase as needed with poor po Continue low fiber diet     MONITORING, EVALUATION, GOAL: wt trends, intake    NEXT VISIT: To be scheduled with post treatment f/u

## 2024-03-17 NOTE — Progress Notes (Signed)
 PICC Removal Note: S: Patient presented per MD orders for PICC line removal.  O: PICC line removed from RAC antecubital after sterile site prepped per protocol. PICC catheter tip visualized and intact. 34 cm confirmed on visual observation. Pressure dressing applied with clear Tegaderm dressing. A: No redness, ecchymosis, edema, swelling, or drainage noted at site. Patient supine for 30-minutes following removal. P: Instructions provided on post PICC discharge care, including followup notification instructions. Patient discharged in stable condition. Strict ED precautions removed.

## 2024-03-17 NOTE — Patient Instructions (Signed)
 PICC Removal, Adult, Care After The following information offers guidance on how to care for yourself after your procedure. Your health care provider may also give you more specific instructions. If you have problems or questions, contact your health care provider. What can I expect after the procedure? After the procedure, it is common to have: Tenderness or soreness. Redness, swelling, or a scab at the place where your PICC was removed (exit site). Follow these instructions at home: For the first 24 hours after the procedure: Keep the bandage (dressing) on your exit site clean and dry. Do not remove your dressing until your health care provider tells you to do so. Do not lift anything heavy or do activities that require great effort until your health care provider says it is okay. You should avoid: Lifting weights. Doing yard work. Doing any physical activity with repetitive arm movement. Watch closely for any signs of an air bubble in the vein (air embolism). This is a rare but serious complication. Signs of an air embolism include trouble breathing, wheezing, chest pain, or a fast pulse. If you have signs of an air embolism, call 911 right away and lie down on your left side to keep the air from moving into your lungs. After 24 hours have passed:  Remove your dressing as told by your health care provider. Wash your hands with soap and water for at least 20 seconds before and after you change your dressing. If soap and water are not available, use hand sanitizer. Return to your normal activities as told by your health care provider. A small scab may develop over the exit site. Do not pick at the scab. When bathing or showering, gently wash the exit site with soap and water. Pat it dry. Watch for signs of infection, such as: A fever or chills. Swollen glands under your arm. More redness, swelling, or soreness around your arm. Blood, fluid, or pus coming from your exit site. Warmth or a  bad smell coming from your exit site. A red streak spreading away from your exit site. General instructions Take over-the-counter and prescription medicines only as told by your health care provider. Do not take any new medicines without checking with your health care provider first. If you were given an antibiotic ointment, apply it as told by your health care provider. Keep all follow-up visits. This is important. Contact a health care provider if: You have a fever or chills. You have swelling at your exit site or swollen glands under your arm. You have signs of infection at your exit site. You have soreness, redness, or swelling in your arm that gets worse. Get help right away if: You have numbness or tingling in your fingers, hand, or arm. Your arm looks blue and feels cold. You have signs of an air embolism, such as trouble breathing, wheezing, chest pain, or a fast pulse. These symptoms may be an emergency. Get medical help right away. Call 911. Do not wait to see if the symptoms will go away. Do not drive yourself to the hospital. Summary After a PICC is removed, it is common to have tenderness or soreness, redness, swelling, or a scab at the exit site. Keep the bandage (dressing) over the exit site clean and dry. Do not remove the dressing until your health care provider tells you to do so. Do not lift anything heavy or do activities that require great effort until your health care provider says it is okay. Watch closely for any signs  of an air bubble (air embolism). If you have signs of an air embolism, call 911 right away and lie down on your left side. This information is not intended to replace advice given to you by your health care provider. Make sure you discuss any questions you have with your health care provider. Document Revised: 11/06/2020 Document Reviewed: 11/06/2020 [employer] Patient Education  2024 [employer].

## 2024-03-17 NOTE — Assessment & Plan Note (Addendum)
 Please review oncology history for additional details and timeline of events.  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 Biopsies confirm squamous cell carcinoma.   CT abdomen and pelvis on 01/19/2024 showed irregularity of the rectal wall with an ill-defined 1.5 x 2 cm hypodense lesion, in keeping with known rectal mass. 9 mm lymph node in the posterior pelvis adjacent to the rectosigmoid, concerning for local lymph node involvement.  No evidence of distant metastasis in the abdomen or pelvis.  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    On 02/02/2024, staging PET scan showed intense hypermetabolic activity throughout the anus, consistent with known primary anal carcinoma.  10 mm hypermetabolic left presacral lymph node, consistent with lymph node metastatic disease.  No other sites of metastatic disease was identified.  cT2,cN1,cM0, Stage II B disease.   We will proceed with concurrent chemoradiation using 5-FU/mitomycin using Nigro protocol.  Treatments began on 02/14/2024. Chemotherapy will be given via PICC line on days 1-4 and days 29-32. We will monitor for treatment side effects such as nausea, diarrhea, low blood counts, fatigue, altered taste, and hair thinning, and manage accordingly.  She tolerated the first cycle of 5-FU and mitomycin well.  Labs today reveal no dose-limiting toxicities.  Overall she is doing very well clinically. She reports improvement in pain from the tumor, indicating a positive response to the treatment.   She started day 29 of chemotherapy on 03/13/2024.  Tolerating treatments fairly well except for expected side effects including decreased appetite, nausea and some soreness in the site of radiation.  Labs today reveal no dose-limiting toxicities.  She was advised to continue radiation treatments as planned. Last  day of radiation scheduled for November 21st, 2025.  - Continue using Aquaphor for vaginal itching as needed.  I will see her in 4 weeks for follow-up with repeat labs.

## 2024-03-18 ENCOUNTER — Ambulatory Visit

## 2024-03-20 ENCOUNTER — Other Ambulatory Visit: Payer: Self-pay | Admitting: Radiation Oncology

## 2024-03-20 ENCOUNTER — Other Ambulatory Visit: Payer: Self-pay | Admitting: Oncology

## 2024-03-20 ENCOUNTER — Ambulatory Visit
Admission: RE | Admit: 2024-03-20 | Discharge: 2024-03-20 | Disposition: A | Source: Ambulatory Visit | Attending: Radiation Oncology

## 2024-03-20 ENCOUNTER — Other Ambulatory Visit: Payer: Self-pay

## 2024-03-20 DIAGNOSIS — Z510 Encounter for antineoplastic radiation therapy: Secondary | ICD-10-CM | POA: Diagnosis not present

## 2024-03-20 DIAGNOSIS — C218 Malignant neoplasm of overlapping sites of rectum, anus and anal canal: Secondary | ICD-10-CM

## 2024-03-20 LAB — RAD ONC ARIA SESSION SUMMARY
Course Elapsed Days: 35
Plan Fractions Treated to Date: 26
Plan Prescribed Dose Per Fraction: 1.8 Gy
Plan Total Fractions Prescribed: 30
Plan Total Prescribed Dose: 54 Gy
Reference Point Dosage Given to Date: 46.8 Gy
Reference Point Session Dosage Given: 1.8 Gy
Session Number: 26

## 2024-03-20 MED ORDER — DICYCLOMINE HCL 10 MG PO CAPS: 10.0000 mg | ORAL_CAPSULE | Freq: Four times a day (QID) | ORAL | 0 refills | Status: DC

## 2024-03-20 MED ORDER — OXYCODONE HCL ER 10 MG PO T12A: 10.0000 mg | EXTENDED_RELEASE_TABLET | Freq: Two times a day (BID) | ORAL | 0 refills | Status: DC

## 2024-03-20 MED ORDER — MORPHINE SULFATE ER 15 MG PO TBCR: 15.0000 mg | EXTENDED_RELEASE_TABLET | Freq: Two times a day (BID) | ORAL | 0 refills | Status: DC

## 2024-03-21 ENCOUNTER — Other Ambulatory Visit: Payer: Self-pay

## 2024-03-21 ENCOUNTER — Ambulatory Visit
Admission: RE | Admit: 2024-03-21 | Discharge: 2024-03-21 | Disposition: A | Source: Ambulatory Visit | Attending: Radiation Oncology | Admitting: Radiation Oncology

## 2024-03-21 ENCOUNTER — Ambulatory Visit

## 2024-03-21 DIAGNOSIS — Z510 Encounter for antineoplastic radiation therapy: Secondary | ICD-10-CM | POA: Diagnosis not present

## 2024-03-21 LAB — RAD ONC ARIA SESSION SUMMARY
Course Elapsed Days: 36
Plan Fractions Treated to Date: 27
Plan Prescribed Dose Per Fraction: 1.8 Gy
Plan Total Fractions Prescribed: 30
Plan Total Prescribed Dose: 54 Gy
Reference Point Dosage Given to Date: 48.6 Gy
Reference Point Session Dosage Given: 1.8 Gy
Session Number: 27

## 2024-03-22 ENCOUNTER — Ambulatory Visit
Admission: RE | Admit: 2024-03-22 | Discharge: 2024-03-22 | Disposition: A | Discharge status: Home / Self Care | Source: Ambulatory Visit | Attending: Radiation Oncology | Admitting: Radiation Oncology

## 2024-03-22 ENCOUNTER — Other Ambulatory Visit: Payer: Self-pay

## 2024-03-22 ENCOUNTER — Other Ambulatory Visit: Payer: Self-pay | Admitting: Oncology

## 2024-03-22 ENCOUNTER — Ambulatory Visit

## 2024-03-22 ENCOUNTER — Ambulatory Visit
Admission: RE | Admit: 2024-03-22 | Discharge: 2024-03-22 | Disposition: A | Source: Ambulatory Visit | Attending: Radiation Oncology

## 2024-03-22 DIAGNOSIS — Z510 Encounter for antineoplastic radiation therapy: Secondary | ICD-10-CM | POA: Diagnosis not present

## 2024-03-22 DIAGNOSIS — R300 Dysuria: Secondary | ICD-10-CM

## 2024-03-22 LAB — URINALYSIS, COMPLETE (UACMP) WITH MICROSCOPIC
Bilirubin Urine: NEGATIVE
Glucose, UA: NEGATIVE mg/dL
Hgb urine dipstick: NEGATIVE
Ketones, ur: NEGATIVE mg/dL
Nitrite: POSITIVE — AB
Protein, ur: 100 mg/dL — AB
Specific Gravity, Urine: 1.016 (ref 1.005–1.030)
pH: 6 (ref 5.0–8.0)

## 2024-03-22 LAB — RAD ONC ARIA SESSION SUMMARY
Course Elapsed Days: 37
Plan Fractions Treated to Date: 28
Plan Prescribed Dose Per Fraction: 1.8 Gy
Plan Total Fractions Prescribed: 30
Plan Total Prescribed Dose: 54 Gy
Reference Point Dosage Given to Date: 50.4 Gy
Reference Point Session Dosage Given: 1.8 Gy
Session Number: 28

## 2024-03-22 MED ORDER — SULFAMETHOXAZOLE-TRIMETHOPRIM 800-160 MG PO TABS: 1.0000 | ORAL_TABLET | Freq: Two times a day (BID) | ORAL | 0 refills | Status: DC

## 2024-03-23 ENCOUNTER — Other Ambulatory Visit: Payer: Self-pay

## 2024-03-23 ENCOUNTER — Other Ambulatory Visit: Payer: Self-pay | Admitting: Oncology

## 2024-03-23 ENCOUNTER — Encounter: Payer: Self-pay | Admitting: Oncology

## 2024-03-23 ENCOUNTER — Ambulatory Visit
Admission: RE | Admit: 2024-03-23 | Discharge: 2024-03-23 | Disposition: A | Discharge status: Home / Self Care | Source: Ambulatory Visit | Attending: Radiation Oncology | Admitting: Radiation Oncology

## 2024-03-23 LAB — RAD ONC ARIA SESSION SUMMARY
Course Elapsed Days: 38
Plan Fractions Treated to Date: 29
Plan Prescribed Dose Per Fraction: 1.8 Gy
Plan Total Fractions Prescribed: 30
Plan Total Prescribed Dose: 54 Gy
Reference Point Dosage Given to Date: 52.2 Gy
Reference Point Session Dosage Given: 1.8 Gy
Session Number: 29

## 2024-03-23 MED ORDER — CIPROFLOXACIN HCL 750 MG PO TABS: 750.0000 mg | ORAL_TABLET | Freq: Two times a day (BID) | ORAL | 0 refills | Status: DC

## 2024-03-24 ENCOUNTER — Other Ambulatory Visit: Payer: Self-pay

## 2024-03-24 ENCOUNTER — Telehealth: Payer: Self-pay | Admitting: Radiation Oncology

## 2024-03-24 ENCOUNTER — Ambulatory Visit
Admission: RE | Admit: 2024-03-24 | Discharge: 2024-03-24 | Disposition: A | Discharge status: Home / Self Care | Source: Ambulatory Visit | Attending: Radiation Oncology | Admitting: Radiation Oncology

## 2024-03-24 DIAGNOSIS — Z510 Encounter for antineoplastic radiation therapy: Secondary | ICD-10-CM | POA: Diagnosis not present

## 2024-03-24 DIAGNOSIS — C44520 Squamous cell carcinoma of anal skin: Secondary | ICD-10-CM | POA: Diagnosis not present

## 2024-03-24 LAB — RAD ONC ARIA SESSION SUMMARY
Course Elapsed Days: 39
Plan Fractions Treated to Date: 30
Plan Prescribed Dose Per Fraction: 1.8 Gy
Plan Total Fractions Prescribed: 30
Plan Total Prescribed Dose: 54 Gy
Reference Point Dosage Given to Date: 54 Gy
Reference Point Session Dosage Given: 1.8 Gy
Session Number: 30

## 2024-03-24 LAB — URINE CULTURE: Culture: 70000 — AB

## 2024-03-24 NOTE — Telephone Encounter (Signed)
 11/21 @ 12:06 pm Left voicemail for patient to call our office to be sch for follow up appt.

## 2024-03-25 ENCOUNTER — Encounter (HOSPITAL_BASED_OUTPATIENT_CLINIC_OR_DEPARTMENT_OTHER): Payer: Self-pay | Admitting: Emergency Medicine

## 2024-03-25 ENCOUNTER — Other Ambulatory Visit: Payer: Self-pay

## 2024-03-25 ENCOUNTER — Emergency Department (HOSPITAL_BASED_OUTPATIENT_CLINIC_OR_DEPARTMENT_OTHER)

## 2024-03-25 ENCOUNTER — Emergency Department (HOSPITAL_BASED_OUTPATIENT_CLINIC_OR_DEPARTMENT_OTHER)
Admission: EM | Admit: 2024-03-25 | Discharge: 2024-03-25 | Disposition: A | Discharge status: Home / Self Care | Source: Ambulatory Visit | Attending: Emergency Medicine | Admitting: Emergency Medicine

## 2024-03-25 DIAGNOSIS — K5900 Constipation, unspecified: Secondary | ICD-10-CM | POA: Insufficient documentation

## 2024-03-25 DIAGNOSIS — Z853 Personal history of malignant neoplasm of breast: Secondary | ICD-10-CM | POA: Insufficient documentation

## 2024-03-25 DIAGNOSIS — D259 Leiomyoma of uterus, unspecified: Secondary | ICD-10-CM | POA: Diagnosis not present

## 2024-03-25 DIAGNOSIS — D701 Agranulocytosis secondary to cancer chemotherapy: Secondary | ICD-10-CM | POA: Insufficient documentation

## 2024-03-25 DIAGNOSIS — T451X5A Adverse effect of antineoplastic and immunosuppressive drugs, initial encounter: Secondary | ICD-10-CM | POA: Insufficient documentation

## 2024-03-25 DIAGNOSIS — D709 Neutropenia, unspecified: Secondary | ICD-10-CM | POA: Diagnosis not present

## 2024-03-25 DIAGNOSIS — R109 Unspecified abdominal pain: Secondary | ICD-10-CM | POA: Diagnosis not present

## 2024-03-25 LAB — CBC WITH DIFFERENTIAL/PLATELET
Abs Immature Granulocytes: 0 K/uL (ref 0.00–0.07)
Basophils Absolute: 0 K/uL (ref 0.0–0.1)
Basophils Relative: 0 %
Eosinophils Absolute: 0.2 K/uL (ref 0.0–0.5)
Eosinophils Relative: 41 %
HCT: 29 % — ABNORMAL LOW (ref 36.0–46.0)
Hemoglobin: 9.9 g/dL — ABNORMAL LOW (ref 12.0–15.0)
Immature Granulocytes: 0 %
Lymphocytes Relative: 43 %
Lymphs Abs: 0.2 K/uL — ABNORMAL LOW (ref 0.7–4.0)
MCH: 32 pg (ref 26.0–34.0)
MCHC: 34.1 g/dL (ref 30.0–36.0)
MCV: 93.9 fL (ref 80.0–100.0)
Monocytes Absolute: 0 K/uL — ABNORMAL LOW (ref 0.1–1.0)
Monocytes Relative: 5 %
Neutro Abs: 0 K/uL — CL (ref 1.7–7.7)
Neutrophils Relative %: 11 %
Platelets: 51 K/uL — ABNORMAL LOW (ref 150–400)
RBC: 3.09 MIL/uL — ABNORMAL LOW (ref 3.87–5.11)
RDW: 13 % (ref 11.5–15.5)
WBC: 0.4 K/uL — CL (ref 4.0–10.5)
nRBC: 0 % (ref 0.0–0.2)

## 2024-03-25 LAB — COMPREHENSIVE METABOLIC PANEL WITH GFR
ALT: 97 U/L — ABNORMAL HIGH (ref 0–44)
AST: 58 U/L — ABNORMAL HIGH (ref 15–41)
Albumin: 3.2 g/dL — ABNORMAL LOW (ref 3.5–5.0)
Alkaline Phosphatase: 68 U/L (ref 38–126)
Anion gap: 7 (ref 5–15)
BUN: 10 mg/dL (ref 8–23)
CO2: 29 mmol/L (ref 22–32)
Calcium: 9 mg/dL (ref 8.9–10.3)
Chloride: 97 mmol/L — ABNORMAL LOW (ref 98–111)
Creatinine, Ser: 0.49 mg/dL (ref 0.44–1.00)
GFR, Estimated: >60 mL/min (ref >60)
Glucose, Bld: 116 mg/dL — ABNORMAL HIGH (ref 70–99)
Potassium: 4 mmol/L (ref 3.5–5.1)
Sodium: 134 mmol/L — ABNORMAL LOW (ref 135–145)
Total Bilirubin: 0.5 mg/dL (ref 0.0–1.2)
Total Protein: 6.3 g/dL — ABNORMAL LOW (ref 6.5–8.1)

## 2024-03-25 LAB — URINALYSIS, W/ REFLEX TO CULTURE (INFECTION SUSPECTED)
Bacteria, UA: NONE SEEN
Bilirubin Urine: NEGATIVE
Glucose, UA: NEGATIVE mg/dL
Ketones, ur: NEGATIVE mg/dL
Leukocytes,Ua: NEGATIVE
Nitrite: NEGATIVE
Specific Gravity, Urine: 1.035 — ABNORMAL HIGH (ref 1.005–1.030)
pH: 6.5 (ref 5.0–8.0)

## 2024-03-25 LAB — LIPASE, BLOOD: Lipase: 11 U/L (ref 11–51)

## 2024-03-25 MED ORDER — IOHEXOL 300 MG/ML  SOLN
90.0000 mL | Freq: Once | INTRAMUSCULAR | Status: AC | PRN
  Administered 2024-03-25: 90 mL via INTRAVENOUS

## 2024-03-25 MED ORDER — FILGRASTIM-AAFI 480 MCG/0.8ML IJ SOSY
480.0000 ug | PREFILLED_SYRINGE | Freq: Once | INTRAMUSCULAR | Status: AC
  Administered 2024-03-25: 480 ug via SUBCUTANEOUS
  Filled 2024-03-25: qty 0.8

## 2024-03-25 MED ORDER — LACTULOSE 10 GM/15ML PO SOLN: 20.0000 g | Freq: Two times a day (BID) | ORAL | 0 refills | Status: DC | PRN

## 2024-03-25 MED ORDER — SODIUM CHLORIDE 0.9 % IV BOLUS
1000.0000 mL | Freq: Once | INTRAVENOUS | Status: AC
  Administered 2024-03-25: 1000 mL via INTRAVENOUS

## 2024-03-25 MED ORDER — IOHEXOL 300 MG/ML  SOLN: 80.0000 mL | Freq: Once | INTRAMUSCULAR | Status: DC | PRN

## 2024-03-25 MED ORDER — ONDANSETRON HCL 4 MG/2ML IJ SOLN
4.0000 mg | Freq: Once | INTRAMUSCULAR | Status: AC
  Administered 2024-03-25: 4 mg via INTRAVENOUS
  Filled 2024-03-25: qty 2

## 2024-03-25 NOTE — ED Provider Notes (Signed)
 [place] EMERGENCY DEPARTMENT AT [facility] Provider Note   CSN: [id number] Arrival date & time: 03/25/24  1242     Patient presents with: Constipation   Katrina [name] is a 68 y.o. female history of breast cancer getting radiation and chemotherapy here for evaluation of constipation.  Has not had a bowel movement since Monday.  She is passing flatus.  She denies any blood from her rectum or stool.  She has some abdominal fullness however denies any overt pain.  She states she recently was started on opiates on Monday for pain from her cancer.  She is followed by Dr. Autumn with oncology.  Started on Cipro  2 days ago for UTI, Pseudomonas.  She denies any dysuria or hematuria.  Not having any fevers at home.  States in general she has had an overall decreased appetite since starting chemo.  No vomiting.  Has tried taking MiraLAX  however due to her decreased appetite has had difficulty getting more than 1 dose in.   HPI     Prior to Admission medications   Medication Sig Start Date End Date Taking? Authorizing Provider  lactulose  (CHRONULAC ) 10 GM/15ML solution Take 30 mLs (20 g total) by mouth 2 (two) times daily as needed for mild constipation. 03/25/24  Yes Maelyn Berrey A, PA-C  Adalimumab (HUMIRA PEN) 40 MG/0.4ML PNKT  07/26/17   [provider]  aluminum  sulfate-calcium  acetate (DOMEBORO) packet Apply 1 packet topically 3 (three) times daily. 03/16/24   Maritza Stagger, MD  Ascorbic Acid (VITAMIN C) 1000 MG tablet Take 1,000 mg by mouth daily.    [provider]  calcium  carbonate (OS-CAL) 600 MG TABS Take 600 mg by mouth daily.    [provider]  cholecalciferol (VITAMIN D ) 1000 UNITS tablet Take by mouth daily.    [provider]  ciprofloxacin  (CIPRO ) 750 MG tablet Take 1 tablet (750 mg total) by mouth 2 (two) times daily. 03/23/24   Pasam, Chinita, MD  clonazePAM  (KLONOPIN ) 0.1 mg/mL SUSP Take 5 mLs (0.5 mg total) by mouth 2 (two)  times daily as needed. 11/28/23   [name], MD  denosumab  (PROLIA ) 60 MG/ML SOSY injection Inject 60 mg into the skin every 6 (six) months.    [provider]  dicyclomine  (BENTYL ) 10 MG capsule Take 1 capsule (10 mg total) by mouth in the morning, at noon, in the evening, and at bedtime. 03/20/24   Maritza Stagger, MD  escitalopram  (LEXAPRO ) 10 MG tablet Take 1 tablet (10 mg total) by mouth daily. 02/03/24   Maritza Stagger, MD  folic acid  (FOLVITE ) 1 MG tablet Take 2 mg by mouth daily. 02/10/21   [provider]  hydrOXYzine  (ATARAX ) 10 MG tablet Take 1 tablet (10 mg total) by mouth 3 (three) times daily as needed for anxiety. 02/03/24   Maritza Stagger, MD  LORazepam  (ATIVAN ) 0.5 MG tablet Take 1 tablet (0.5 mg total) by mouth at bedtime. 02/11/24   Pasam, Chinita, MD  magic mouthwash w/lidocaine  SOLN Swish and spit up to 4 times a day as needed for mouth pain. Suspension contains equal amounts of Maalox Extra Strength, nystatin, diphenhydramine and lidocaine . 02/23/24   Maritza Stagger, MD  meloxicam (MOBIC) 15 MG tablet Take 1 tablet by mouth as needed.    [provider]  methotrexate (RHEUMATREX) 2.5 MG tablet Take 15 mg by mouth once a week. Caution:Chemotherapy. Protect from light.  PRN    [provider]  morphine  (MS CONTIN ) 15 MG 12 hr  tablet Take 1 tablet (15 mg total) by mouth every 12 (twelve) hours. 03/20/24   Maritza Stagger, MD  NON FORMULARY Diltiazem 2%/Lidocaine5% compound Apply to rectum 3 times daily for 8 weeks 01/11/24   Federico Rosario BROCKS, MD  ondansetron  (ZOFRAN ) 8 MG tablet Take 1 tablet (8 mg total) by mouth every 8 (eight) hours as needed for nausea or vomiting. 01/27/24   Pasam, Chinita, MD  oxyCODONE  (OXY IR/ROXICODONE ) 5 MG immediate release tablet Take 1 tablet (5 mg total) by mouth every 4 (four) hours as needed for severe pain (pain score 7-10). 01/31/24   Maritza Stagger, MD  oxyCODONE  (OXYCONTIN ) 10 mg 12 hr tablet Take 1 tablet (10 mg  total) by mouth every 12 (twelve) hours. 03/20/24   Maritza Stagger, MD  prochlorperazine  (COMPAZINE ) 10 MG tablet Take 1 tablet (10 mg total) by mouth every 6 (six) hours as needed for nausea or vomiting. 01/27/24   Pasam, Chinita, MD  traMADol  (ULTRAM ) 50 MG tablet Take 1 tablet (50 mg total) by mouth every 6 (six) hours as needed. 01/19/24   Nandigam, Kavitha V, MD    Allergies: Patient has no known allergies.    Review of Systems  Constitutional: Negative.   HENT: Negative.    Respiratory: Negative.    Cardiovascular: Negative.   Gastrointestinal:  Positive for constipation and nausea. Negative for abdominal distention, abdominal pain, anal bleeding, blood in stool and vomiting.  Genitourinary: Negative.   Musculoskeletal: Negative.   Skin: Negative.   Neurological: Negative.   All other systems reviewed and are negative.  Updated Vital Signs BP 107/65   Pulse 83   Temp 98.6 F (37 C) (Oral)   Resp 15   Wt 53.1 kg   LMP 05/04/2002 (Approximate)   SpO2 98%   BMI 21.40 kg/m   Physical Exam Vitals and nursing note reviewed.  Constitutional:      General: She is not in acute distress.    Appearance: She is well-developed. She is not ill-appearing, toxic-appearing or diaphoretic.  HENT:     Head: Normocephalic and atraumatic.     Nose: Nose normal.     Mouth/Throat:     Mouth: Mucous membranes are moist.  Eyes:     Pupils: Pupils are equal, round, and reactive to light.  Cardiovascular:     Rate and Rhythm: Normal rate.     Pulses: Normal pulses.     Heart sounds: Normal heart sounds.  Pulmonary:     Effort: Pulmonary effort is normal. No respiratory distress.     Breath sounds: Normal breath sounds.  Abdominal:     General: Bowel sounds are normal. There is no distension.     Palpations: Abdomen is soft.     Tenderness: There is no abdominal tenderness. There is no right CVA tenderness, left CVA tenderness, guarding or rebound.  Musculoskeletal:        General:  Normal range of motion.     Cervical back: Normal range of motion.  Skin:    General: Skin is warm and dry.     Capillary Refill: Capillary refill takes less than 2 seconds.  Neurological:     General: No focal deficit present.     Mental Status: She is alert.     Cranial Nerves: No cranial nerve deficit.     Sensory: No sensory deficit.     Motor: No weakness.     Gait: Gait normal.  Psychiatric:        Mood and Affect:  Mood normal.     (all labs ordered are listed, but only abnormal results are displayed) Labs Reviewed  CBC WITH DIFFERENTIAL/PLATELET - Abnormal; Notable for the following components:      Result Value   WBC 0.4 (*)    RBC 3.09 (*)    Hemoglobin 9.9 (*)    HCT 29.0 (*)    Platelets 51 (*)    Neutro Abs 0.0 (*)    Lymphs Abs 0.2 (*)    Monocytes Absolute 0.0 (*)    All other components within normal limits  COMPREHENSIVE METABOLIC PANEL WITH GFR - Abnormal; Notable for the following components:   Sodium 134 (*)    Chloride 97 (*)    Glucose, Bld 116 (*)    Total Protein 6.3 (*)    Albumin 3.2 (*)    AST 58 (*)    ALT 97 (*)    All other components within normal limits  URINALYSIS, W/ REFLEX TO CULTURE (INFECTION SUSPECTED) - Abnormal; Notable for the following components:   Specific Gravity, Urine 1.035 (*)    Hgb urine dipstick SMALL (*)    Protein, ur TRACE (*)    All other components within normal limits  LIPASE, BLOOD    EKG: None  Radiology: CT ABDOMEN PELVIS W CONTRAST Result Date: 03/25/2024 CLINICAL DATA:  Abdominal pain. Constipation. History of he female cancer. EXAM: CT ABDOMEN AND PELVIS WITH CONTRAST TECHNIQUE: Multidetector CT imaging of the abdomen and pelvis was performed using the standard protocol following bolus administration of intravenous contrast. RADIATION DOSE REDUCTION: This exam was performed according to the departmental dose-optimization program which includes automated exposure control, adjustment of the mA and/or kV  according to patient size and/or use of iterative reconstruction technique. CONTRAST:  90mL OMNIPAQUE  IOHEXOL  300 MG/ML  SOLN COMPARISON:  CT abdomen pelvis dated 01/19/2024. FINDINGS: Lower chest: The visualized lung bases are clear. No intra-abdominal free air or free fluid. Hepatobiliary: The liver is unremarkable. No biliary dilatation. The gallbladder is unremarkable Pancreas: Unremarkable. No pancreatic ductal dilatation or surrounding inflammatory changes. Spleen: Normal in size without focal abnormality. Adrenals/Urinary Tract: The adrenal glands, kidneys, visualized ureters, and urinary bladder appear unremarkable. Stomach/Bowel: Evaluation of the bowel is limited in the absence of oral contrast. There is no bowel obstruction or active inflammation. There is moderate stool throughout the colon. Appendectomy. Vascular/Lymphatic: The abdominal aorta and IVC are unremarkable. No portal venous gas. No adenopathy. Reproductive: Calcified uterine fibroid. No suspicious adnexal masses. Other: None Musculoskeletal: No acute or significant osseous findings. IMPRESSION: 1. No acute intra-abdominal or pelvic pathology. 2. Moderate colonic stool burden. No bowel obstruction. Electronically Signed   By: Vanetta Chou M.D.   On: 03/25/2024 16:22     Procedures   Medications Ordered in the ED  sodium chloride  0.9 % bolus 1,000 mL ( Intravenous Stopped 03/25/24 1622)  ondansetron  (ZOFRAN ) injection 4 mg (4 mg Intravenous Given 03/25/24 1457)  iohexol  (OMNIPAQUE ) 300 MG/ML solution 90 mL (90 mLs Intravenous Contrast Given 03/25/24 1555)  filgrastim -aafi (NIVESTYM ) injection 480 mcg (480 mcg Subcutaneous Given 03/25/24 1130)   68 year old here for evaluation of constipation.  She has a history of rectal cancer getting chemotherapy and radiation.  She is on day 2 of antibiotics for Pseudomonas UTI.  She states he has not had a bowel movement since Monday.  She is passing flatus.  Feels full in her abdomen  however denies any overt pain.  No fever, vomiting has had overall decreased appetite in general since being  on chemotherapy.  Will plan on labs, imaging, reassess  Labs and imaging personally viewed and interpreted:  CBC WBC 0.4, neutrophil 0 Metabolic panel AST 58, ALT 97 Lipase 11 UA neg for infection CT AP moderate stool burden  Patient reassessed.  She did not get Neupogen  injection after her chemotherapy on 11/24.  Will discuss with oncology given her abnormal CBC  Clinical Course as of 03/25/24 1912  Sat Mar 25, 2024  1704 CONSULT with Dr.Eneveer with Onc rec Neupogen  injection here in the ED.  She can continue taking her Cipro , follow-up early next week with oncology.  Does not need to be admitted at this time.  Recommends lactulose  for constipation, no enemas given her CA location [BH]    Clinical Course User Index [BH] Larry Knipp A, PA-C   Discussed with oncology who recommends Neupogen  injection, DC home continue taking antibiotics, strict return precautions.  Lactulose  for constipation  Discussed with patient.  She is agreeable.  States she has not had any fevers at home she is tolerating p.o. intake here.  Will have her follow-up outpatient with oncology in 1 to 2 days, strict return precautions.  Low suspicion for sepsis.  The patient has been appropriately medically screened and/or stabilized in the ED. I have low suspicion for any other emergent medical condition which would require further screening, evaluation or treatment in the ED or require inpatient management.  Patient is hemodynamically stable and in no acute distress.  Patient able to ambulate in department prior to ED.  Evaluation does not show acute pathology that would require ongoing or additional emergent interventions while in the emergency department or further inpatient treatment.  I have discussed the diagnosis with the patient and answered all questions.  Pain is been managed while in the emergency  department and patient has no further complaints prior to discharge.  Patient is comfortable with plan discussed in room and is stable for discharge at this time.  I have discussed strict return precautions for returning to the emergency department.  Patient was encouraged to follow-up with PCP/specialist refer to at discharge.                                  Medical Decision Making Amount and/or Complexity of Data Reviewed Independent Historian: spouse External Data Reviewed: labs, radiology and notes. Labs: ordered. Decision-making details documented in ED Course. Radiology: ordered and independent interpretation performed. Decision-making details documented in ED Course.  Risk OTC drugs. Prescription drug management. Decision regarding hospitalization. Diagnosis or treatment significantly limited by social determinants of health.        Final diagnoses:  Constipation, unspecified constipation type  Chemotherapy-induced neutropenia    ED Discharge Orders          Ordered    lactulose  (CHRONULAC ) 10 GM/15ML solution  2 times daily PRN        03/25/24 1909               Dora Clauss A, PA-C 03/25/24 1912    Mannie Pac T, DO 03/26/24 [phone]

## 2024-03-25 NOTE — Discharge Instructions (Signed)
 It was a pleasure taking care of you here today  As we discussed your white blood cell count was very low which is likely due to your chemotherapy treatment.  We gave you a shot help bring this up.  Please call your oncologist on Monday for an appointment early next week.  I have written you for a medication called lactulose  to help with your constipation.  As we discussed in the room if you develop fever or worsening symptoms you need to be seen in the emergency department  Continue taking the Cipro  prescribed to you by your oncologist  Return for new or worsening symptoms

## 2024-03-25 NOTE — ED Triage Notes (Signed)
 Pt c/o constipation x 5 days. Pt currently taking chemo, using opioids

## 2024-03-25 NOTE — ED Notes (Signed)
 Patient notified of need for urine sample to complete eval/assessment. Specimen cup provided and instructions for clean catch given. Patient will notify staff when able to provide

## 2024-03-26 ENCOUNTER — Emergency Department (HOSPITAL_COMMUNITY)

## 2024-03-26 ENCOUNTER — Inpatient Hospital Stay (HOSPITAL_COMMUNITY)
Admission: EM | Admit: 2024-03-26 | Discharge: 2024-03-30 | DRG: 809 | Disposition: A | Discharge status: Home / Self Care | Attending: Internal Medicine | Admitting: Internal Medicine

## 2024-03-26 ENCOUNTER — Encounter (HOSPITAL_COMMUNITY): Payer: Self-pay | Admitting: Family Medicine

## 2024-03-26 DIAGNOSIS — D709 Neutropenia, unspecified: Principal | ICD-10-CM | POA: Diagnosis present

## 2024-03-26 DIAGNOSIS — N3000 Acute cystitis without hematuria: Secondary | ICD-10-CM | POA: Diagnosis not present

## 2024-03-26 DIAGNOSIS — Z9049 Acquired absence of other specified parts of digestive tract: Secondary | ICD-10-CM

## 2024-03-26 DIAGNOSIS — C218 Malignant neoplasm of overlapping sites of rectum, anus and anal canal: Secondary | ICD-10-CM | POA: Diagnosis present

## 2024-03-26 DIAGNOSIS — Z823 Family history of stroke: Secondary | ICD-10-CM

## 2024-03-26 DIAGNOSIS — Z8249 Family history of ischemic heart disease and other diseases of the circulatory system: Secondary | ICD-10-CM

## 2024-03-26 DIAGNOSIS — Z7985 Long-term (current) use of injectable non-insulin antidiabetic drugs: Secondary | ICD-10-CM

## 2024-03-26 DIAGNOSIS — E861 Hypovolemia: Secondary | ICD-10-CM | POA: Diagnosis not present

## 2024-03-26 DIAGNOSIS — E440 Moderate protein-calorie malnutrition: Secondary | ICD-10-CM | POA: Diagnosis not present

## 2024-03-26 DIAGNOSIS — R5081 Fever presenting with conditions classified elsewhere: Secondary | ICD-10-CM | POA: Diagnosis not present

## 2024-03-26 DIAGNOSIS — M810 Age-related osteoporosis without current pathological fracture: Secondary | ICD-10-CM | POA: Diagnosis present

## 2024-03-26 DIAGNOSIS — Z83438 Family history of other disorder of lipoprotein metabolism and other lipidemia: Secondary | ICD-10-CM | POA: Diagnosis not present

## 2024-03-26 DIAGNOSIS — D696 Thrombocytopenia, unspecified: Secondary | ICD-10-CM | POA: Diagnosis not present

## 2024-03-26 DIAGNOSIS — G893 Neoplasm related pain (acute) (chronic): Secondary | ICD-10-CM | POA: Diagnosis present

## 2024-03-26 DIAGNOSIS — Y842 Radiological procedure and radiotherapy as the cause of abnormal reaction of the patient, or of later complication, without mention of misadventure at the time of the procedure: Secondary | ICD-10-CM | POA: Diagnosis not present

## 2024-03-26 DIAGNOSIS — Z79899 Other long term (current) drug therapy: Secondary | ICD-10-CM

## 2024-03-26 DIAGNOSIS — D701 Agranulocytosis secondary to cancer chemotherapy: Principal | ICD-10-CM | POA: Diagnosis present

## 2024-03-26 DIAGNOSIS — M0579 Rheumatoid arthritis with rheumatoid factor of multiple sites without organ or systems involvement: Secondary | ICD-10-CM

## 2024-03-26 DIAGNOSIS — C779 Secondary and unspecified malignant neoplasm of lymph node, unspecified: Secondary | ICD-10-CM | POA: Diagnosis present

## 2024-03-26 DIAGNOSIS — Z6821 Body mass index (BMI) 21.0-21.9, adult: Secondary | ICD-10-CM

## 2024-03-26 DIAGNOSIS — E871 Hypo-osmolality and hyponatremia: Secondary | ICD-10-CM | POA: Diagnosis not present

## 2024-03-26 DIAGNOSIS — T451X5A Adverse effect of antineoplastic and immunosuppressive drugs, initial encounter: Secondary | ICD-10-CM | POA: Diagnosis present

## 2024-03-26 DIAGNOSIS — Z87891 Personal history of nicotine dependence: Secondary | ICD-10-CM | POA: Diagnosis not present

## 2024-03-26 DIAGNOSIS — R21 Rash and other nonspecific skin eruption: Secondary | ICD-10-CM | POA: Diagnosis not present

## 2024-03-26 DIAGNOSIS — K5900 Constipation, unspecified: Secondary | ICD-10-CM | POA: Diagnosis not present

## 2024-03-26 DIAGNOSIS — F064 Anxiety disorder due to known physiological condition: Secondary | ICD-10-CM | POA: Diagnosis not present

## 2024-03-26 DIAGNOSIS — M069 Rheumatoid arthritis, unspecified: Secondary | ICD-10-CM | POA: Diagnosis not present

## 2024-03-26 DIAGNOSIS — L598 Other specified disorders of the skin and subcutaneous tissue related to radiation: Secondary | ICD-10-CM | POA: Diagnosis present

## 2024-03-26 DIAGNOSIS — Z8349 Family history of other endocrine, nutritional and metabolic diseases: Secondary | ICD-10-CM

## 2024-03-26 DIAGNOSIS — Z803 Family history of malignant neoplasm of breast: Secondary | ICD-10-CM | POA: Diagnosis not present

## 2024-03-26 DIAGNOSIS — B965 Pseudomonas (aeruginosa) (mallei) (pseudomallei) as the cause of diseases classified elsewhere: Secondary | ICD-10-CM | POA: Diagnosis not present

## 2024-03-26 DIAGNOSIS — Z79891 Long term (current) use of opiate analgesic: Secondary | ICD-10-CM

## 2024-03-26 DIAGNOSIS — Z923 Personal history of irradiation: Secondary | ICD-10-CM | POA: Diagnosis not present

## 2024-03-26 DIAGNOSIS — K1231 Oral mucositis (ulcerative) due to antineoplastic therapy: Secondary | ICD-10-CM | POA: Diagnosis present

## 2024-03-26 DIAGNOSIS — D61818 Other pancytopenia: Secondary | ICD-10-CM | POA: Diagnosis present

## 2024-03-26 DIAGNOSIS — D61810 Antineoplastic chemotherapy induced pancytopenia: Secondary | ICD-10-CM | POA: Diagnosis present

## 2024-03-26 DIAGNOSIS — K5903 Drug induced constipation: Secondary | ICD-10-CM

## 2024-03-26 DIAGNOSIS — Z0389 Encounter for observation for other suspected diseases and conditions ruled out: Secondary | ICD-10-CM | POA: Diagnosis not present

## 2024-03-26 DIAGNOSIS — Z8052 Family history of malignant neoplasm of bladder: Secondary | ICD-10-CM

## 2024-03-26 DIAGNOSIS — N390 Urinary tract infection, site not specified: Secondary | ICD-10-CM | POA: Diagnosis not present

## 2024-03-26 DIAGNOSIS — Z860100 Personal history of colon polyps, unspecified: Secondary | ICD-10-CM

## 2024-03-26 DIAGNOSIS — Z79631 Long term (current) use of antimetabolite agent: Secondary | ICD-10-CM

## 2024-03-26 DIAGNOSIS — Z801 Family history of malignant neoplasm of trachea, bronchus and lung: Secondary | ICD-10-CM

## 2024-03-26 LAB — PROTIME-INR
INR: 1.2 (ref 0.8–1.2)
Prothrombin Time: 15.9 s — ABNORMAL HIGH (ref 11.4–15.2)

## 2024-03-26 LAB — COMPREHENSIVE METABOLIC PANEL WITH GFR
ALT: 80 U/L — ABNORMAL HIGH (ref 0–44)
AST: 45 U/L — ABNORMAL HIGH (ref 15–41)
Albumin: 3.6 g/dL (ref 3.5–5.0)
Alkaline Phosphatase: 65 U/L (ref 38–126)
Anion gap: 10 (ref 5–15)
BUN: 8 mg/dL (ref 8–23)
CO2: 25 mmol/L (ref 22–32)
Calcium: 8.5 mg/dL — ABNORMAL LOW (ref 8.9–10.3)
Chloride: 95 mmol/L — ABNORMAL LOW (ref 98–111)
Creatinine, Ser: 0.62 mg/dL (ref 0.44–1.00)
GFR, Estimated: >60 mL/min (ref >60)
Glucose, Bld: 119 mg/dL — ABNORMAL HIGH (ref 70–99)
Potassium: 3.4 mmol/L — ABNORMAL LOW (ref 3.5–5.1)
Sodium: 130 mmol/L — ABNORMAL LOW (ref 135–145)
Total Bilirubin: 0.5 mg/dL (ref 0.0–1.2)
Total Protein: 6.6 g/dL (ref 6.5–8.1)

## 2024-03-26 LAB — URINALYSIS, W/ REFLEX TO CULTURE (INFECTION SUSPECTED)
Bilirubin Urine: NEGATIVE
Glucose, UA: NEGATIVE mg/dL
Ketones, ur: NEGATIVE mg/dL
Leukocytes,Ua: NEGATIVE
Nitrite: NEGATIVE
Protein, ur: 100 mg/dL — AB
RBC / HPF: >50 RBC/hpf (ref 0–5)
Specific Gravity, Urine: 1.024 (ref 1.005–1.030)
pH: 5 (ref 5.0–8.0)

## 2024-03-26 LAB — RESP PANEL BY RT-PCR (RSV, FLU A&B, COVID)  RVPGX2
Influenza A by PCR: NEGATIVE
Influenza B by PCR: NEGATIVE
Resp Syncytial Virus by PCR: NEGATIVE
SARS Coronavirus 2 by RT PCR: NEGATIVE

## 2024-03-26 LAB — I-STAT CG4 LACTIC ACID, ED: Lactic Acid, Venous: 2.1 mmol/L (ref 0.5–1.9)

## 2024-03-26 LAB — LACTIC ACID, PLASMA: Lactic Acid, Venous: 1.5 mmol/L (ref 0.5–1.9)

## 2024-03-26 MED ORDER — ONDANSETRON HCL 4 MG PO TABS: 4.0000 mg | ORAL_TABLET | Freq: Four times a day (QID) | ORAL | Status: DC | PRN

## 2024-03-26 MED ORDER — MORPHINE SULFATE ER 15 MG PO TBCR
15.0000 mg | EXTENDED_RELEASE_TABLET | Freq: Every morning | ORAL | Status: DC
  Administered 2024-03-27 – 2024-03-30 (×4): 15 mg via ORAL
  Filled 2024-03-26 (×4): qty 1

## 2024-03-26 MED ORDER — MORPHINE SULFATE ER 30 MG PO TBCR
30.0000 mg | EXTENDED_RELEASE_TABLET | Freq: Every day | ORAL | Status: DC
  Administered 2024-03-26 – 2024-03-29 (×4): 30 mg via ORAL
  Filled 2024-03-26 (×4): qty 2

## 2024-03-26 MED ORDER — LACTATED RINGERS IV BOLUS (SEPSIS)
1000.0000 mL | Freq: Once | INTRAVENOUS | Status: AC
  Administered 2024-03-26: 1000 mL via INTRAVENOUS

## 2024-03-26 MED ORDER — SODIUM CHLORIDE 0.9 % IV SOLN
2.0000 g | Freq: Once | INTRAVENOUS | Status: AC
  Administered 2024-03-26: 2 g via INTRAVENOUS
  Filled 2024-03-26: qty 12.5

## 2024-03-26 MED ORDER — POTASSIUM CHLORIDE CRYS ER 20 MEQ PO TBCR
20.0000 meq | EXTENDED_RELEASE_TABLET | Freq: Once | ORAL | Status: AC
  Administered 2024-03-26: 20 meq via ORAL
  Filled 2024-03-26: qty 1

## 2024-03-26 MED ORDER — MAGIC MOUTHWASH W/LIDOCAINE
5.0000 mL | Freq: Four times a day (QID) | ORAL | Status: DC | PRN
Start: 2024-03-26 — End: 2024-03-30

## 2024-03-26 MED ORDER — ONDANSETRON HCL 4 MG/2ML IJ SOLN: 4.0000 mg | Freq: Four times a day (QID) | INTRAMUSCULAR | Status: DC | PRN

## 2024-03-26 MED ORDER — ESCITALOPRAM OXALATE 10 MG PO TABS
10.0000 mg | ORAL_TABLET | Freq: Every day | ORAL | Status: DC
  Administered 2024-03-27 – 2024-03-30 (×4): 10 mg via ORAL
  Filled 2024-03-26 (×4): qty 1

## 2024-03-26 MED ORDER — MAGNESIUM CITRATE PO SOLN: 1.0000 | Freq: Once | ORAL | Status: DC | PRN

## 2024-03-26 MED ORDER — ALUM SULFATE-CA ACETATE EX PACK
1.0000 | PACK | Freq: Three times a day (TID) | CUTANEOUS | Status: DC
  Administered 2024-03-26 – 2024-03-30 (×11): 1 via TOPICAL
  Filled 2024-03-26 (×13): qty 1

## 2024-03-26 MED ORDER — SODIUM CHLORIDE 0.9 % IV SOLN
2.0000 g | Freq: Two times a day (BID) | INTRAVENOUS | Status: DC
  Administered 2024-03-26 – 2024-03-30 (×8): 2 g via INTRAVENOUS
  Filled 2024-03-26 (×8): qty 12.5

## 2024-03-26 MED ORDER — ORAL CARE MOUTH RINSE: 15.0000 mL | OROMUCOSAL | Status: DC | PRN

## 2024-03-26 MED ORDER — LACTATED RINGERS IV SOLN: INTRAVENOUS | Status: DC

## 2024-03-26 MED ORDER — MAGNESIUM HYDROXIDE 400 MG/5ML PO SUSP: 30.0000 mL | Freq: Every day | ORAL | Status: DC | PRN

## 2024-03-26 MED ORDER — LACTATED RINGERS IV BOLUS (SEPSIS)
250.0000 mL | Freq: Once | INTRAVENOUS | Status: AC
  Administered 2024-03-26: 250 mL via INTRAVENOUS

## 2024-03-26 MED ORDER — METHOCARBAMOL 1000 MG/10ML IJ SOLN
500.0000 mg | Freq: Four times a day (QID) | INTRAMUSCULAR | Status: DC | PRN
  Administered 2024-03-27 – 2024-03-29 (×3): 500 mg via INTRAVENOUS
  Filled 2024-03-26 (×3): qty 10

## 2024-03-26 MED ORDER — ACETAMINOPHEN 650 MG RE SUPP: 650.0000 mg | Freq: Four times a day (QID) | RECTAL | Status: DC | PRN

## 2024-03-26 MED ORDER — VANCOMYCIN HCL IN DEXTROSE 1-5 GM/200ML-% IV SOLN
1000.0000 mg | Freq: Once | INTRAVENOUS | Status: DC
  Filled 2024-03-26: qty 200

## 2024-03-26 MED ORDER — OXYCODONE HCL 5 MG PO TABS: 5.0000 mg | ORAL_TABLET | ORAL | Status: DC | PRN

## 2024-03-26 MED ORDER — MORPHINE SULFATE ER 15 MG PO TBCR: 15.0000 mg | EXTENDED_RELEASE_TABLET | Freq: Two times a day (BID) | ORAL | Status: DC

## 2024-03-26 MED ORDER — LACTULOSE 10 GM/15ML PO SOLN
20.0000 g | Freq: Two times a day (BID) | ORAL | Status: DC
  Administered 2024-03-26: 20 g via ORAL
  Filled 2024-03-26: qty 30

## 2024-03-26 MED ORDER — DICYCLOMINE HCL 10 MG PO CAPS
10.0000 mg | ORAL_CAPSULE | Freq: Three times a day (TID) | ORAL | Status: DC
  Administered 2024-03-26 – 2024-03-30 (×11): 10 mg via ORAL
  Filled 2024-03-26 (×11): qty 1

## 2024-03-26 MED ORDER — BISACODYL 5 MG PO TBEC: 5.0000 mg | DELAYED_RELEASE_TABLET | Freq: Every day | ORAL | Status: DC | PRN

## 2024-03-26 MED ORDER — SODIUM CHLORIDE 0.9% FLUSH
3.0000 mL | Freq: Two times a day (BID) | INTRAVENOUS | Status: DC
  Administered 2024-03-26 – 2024-03-30 (×9): 3 mL via INTRAVENOUS

## 2024-03-26 MED ORDER — ACETAMINOPHEN 325 MG PO TABS
650.0000 mg | ORAL_TABLET | Freq: Four times a day (QID) | ORAL | Status: DC | PRN
Start: 2024-03-26 — End: 2024-03-27

## 2024-03-26 MED ORDER — LACTATED RINGERS IV BOLUS (SEPSIS)
500.0000 mL | Freq: Once | INTRAVENOUS | Status: AC
  Administered 2024-03-26: 500 mL via INTRAVENOUS

## 2024-03-26 MED ORDER — DOCUSATE SODIUM 100 MG PO CAPS: 100.0000 mg | ORAL_CAPSULE | Freq: Two times a day (BID) | ORAL | Status: DC

## 2024-03-26 MED ORDER — LACTATED RINGERS IV SOLN: INTRAVENOUS | Status: AC

## 2024-03-26 NOTE — Progress Notes (Addendum)
 ED Pharmacy Antibiotic Sign Off An antibiotic consult was received from an ED provider for vancomycin  per pharmacy dosing for sepsis. A chart review was completed to assess appropriateness.   The following one time order(s) were placed:  Vancomycin  1 gm  Further antibiotic and/or antibiotic pharmacy consults should be ordered by the admitting provider if indicated.   Thank you for allowing pharmacy to be a part of this patient's care.   Rosaline IVAR Edison, Pharm.D Use secure chat for questions 03/26/2024 3:17 PM Clinical Pharmacist 03/26/24 3:17 PM   Addendum: Vanc order dc'd by admitting MD. Changed to cefepime .  Rosaline IVAR Edison, Pharm.D Use secure chat for questions 03/26/2024 3:24 PM

## 2024-03-26 NOTE — ED Provider Notes (Signed)
 [place] EMERGENCY DEPARTMENT AT [facility] Provider Note   CSN: [id number] Arrival date & time: 03/26/24  1224     Patient presents with: Fever   Katrina [name] is a 68 y.o. female.  {Add pertinent medical, surgical, social history, OB history to YEP:67052}  Fever    Patient has a history of anal cancer.  She has been receiving radiation and chemotherapy treatment.  Patient states she has been experiencing a rash in the perineal region.  She also has been diagnosed with UTI recently.  She was initially on Septra  and was changed to Cipro  based on culture results on the 20th.  Patient had been also experiencing some constipation.  She was actually seen at another emergency room yesterday for this.  Yesterday she did have a CT scan her abdomen pelvis that did show signs of constipation but no other acute abnormality.  Today patient spiked a fever.  No other new symptoms other than the fever.  Prior to Admission medications   Medication Sig Start Date End Date Taking? Authorizing Provider  Adalimumab (HUMIRA PEN) 40 MG/0.4ML PNKT  07/26/17   [provider]  aluminum  sulfate-calcium  acetate (DOMEBORO) packet Apply 1 packet topically 3 (three) times daily. 03/16/24   Maritza Stagger, MD  Ascorbic Acid (VITAMIN C) 1000 MG tablet Take 1,000 mg by mouth daily.    [provider]  calcium  carbonate (OS-CAL) 600 MG TABS Take 600 mg by mouth daily.    [provider]  cholecalciferol (VITAMIN D ) 1000 UNITS tablet Take by mouth daily.    [provider]  ciprofloxacin  (CIPRO ) 750 MG tablet Take 1 tablet (750 mg total) by mouth 2 (two) times daily. 03/23/24   Pasam, Chinita, MD  clonazePAM  (KLONOPIN ) 0.1 mg/mL SUSP Take 5 mLs (0.5 mg total) by mouth 2 (two) times daily as needed. 11/28/23   [name], MD  denosumab  (PROLIA ) 60 MG/ML SOSY injection Inject 60 mg into the skin every 6 (six) months.    [provider]  dicyclomine  (BENTYL ) 10  MG capsule Take 1 capsule (10 mg total) by mouth in the morning, at noon, in the evening, and at bedtime. 03/20/24   Maritza Stagger, MD  escitalopram  (LEXAPRO ) 10 MG tablet Take 1 tablet (10 mg total) by mouth daily. 02/03/24   Maritza Stagger, MD  folic acid  (FOLVITE ) 1 MG tablet Take 2 mg by mouth daily. 02/10/21   [provider]  hydrOXYzine  (ATARAX ) 10 MG tablet Take 1 tablet (10 mg total) by mouth 3 (three) times daily as needed for anxiety. 02/03/24   Maritza Stagger, MD  lactulose  (CHRONULAC ) 10 GM/15ML solution Take 30 mLs (20 g total) by mouth 2 (two) times daily as needed for mild constipation. 03/25/24   Henderly, Britni A, PA-C  LORazepam  (ATIVAN ) 0.5 MG tablet Take 1 tablet (0.5 mg total) by mouth at bedtime. 02/11/24   Pasam, Chinita, MD  magic mouthwash w/lidocaine  SOLN Swish and spit up to 4 times a day as needed for mouth pain. Suspension contains equal amounts of Maalox Extra Strength, nystatin, diphenhydramine and lidocaine . 02/23/24   Maritza Stagger, MD  meloxicam (MOBIC) 15 MG tablet Take 1 tablet by mouth as needed.    [provider]  methotrexate (RHEUMATREX) 2.5 MG tablet Take 15 mg by mouth once a week. Caution:Chemotherapy. Protect from light.  PRN    [provider]  morphine  (MS CONTIN ) 15 MG 12 hr tablet Take 1 tablet (15 mg total) by mouth every  12 (twelve) hours. 03/20/24   Maritza Stagger, MD  NON FORMULARY Diltiazem 2%/Lidocaine5% compound Apply to rectum 3 times daily for 8 weeks 01/11/24   Federico Rosario BROCKS, MD  ondansetron  (ZOFRAN ) 8 MG tablet Take 1 tablet (8 mg total) by mouth every 8 (eight) hours as needed for nausea or vomiting. 01/27/24   Pasam, Chinita, MD  oxyCODONE  (OXY IR/ROXICODONE ) 5 MG immediate release tablet Take 1 tablet (5 mg total) by mouth every 4 (four) hours as needed for severe pain (pain score 7-10). 01/31/24   Maritza Stagger, MD  oxyCODONE  (OXYCONTIN ) 10 mg 12 hr tablet Take 1 tablet (10 mg total) by mouth every 12 (twelve)  hours. 03/20/24   Maritza Stagger, MD  prochlorperazine  (COMPAZINE ) 10 MG tablet Take 1 tablet (10 mg total) by mouth every 6 (six) hours as needed for nausea or vomiting. 01/27/24   Pasam, Chinita, MD  traMADol  (ULTRAM ) 50 MG tablet Take 1 tablet (50 mg total) by mouth every 6 (six) hours as needed. 01/19/24   Nandigam, Kavitha V, MD    Allergies: Patient has no known allergies.    Review of Systems  Constitutional:  Positive for fever.    Updated Vital Signs BP 122/70 (BP Location: Right Arm)   Pulse (!) 109   Temp (!) 100.8 F (38.2 C) (Oral)   Resp 18   Ht 1.575 m (5' 2)   Wt 52.2 kg   LMP 05/04/2002 (Approximate)   SpO2 93%   BMI 21.03 kg/m   Physical Exam Vitals and nursing note reviewed.  Constitutional:      General: She is not in acute distress.    Appearance: She is well-developed.  HENT:     Head: Normocephalic and atraumatic.     Right Ear: External ear normal.     Left Ear: External ear normal.  Eyes:     General: No scleral icterus.       Right eye: No discharge.        Left eye: No discharge.     Conjunctiva/sclera: Conjunctivae normal.  Neck:     Trachea: No tracheal deviation.  Cardiovascular:     Rate and Rhythm: Regular rhythm. Tachycardia present.  Pulmonary:     Effort: Pulmonary effort is normal. No respiratory distress.     Breath sounds: Normal breath sounds. No stridor. No wheezing or rales.  Abdominal:     General: Bowel sounds are normal. There is no distension.     Palpations: Abdomen is soft.     Tenderness: There is no abdominal tenderness. There is no guarding or rebound.  Musculoskeletal:        General: No tenderness or deformity.     Cervical back: Neck supple.  Skin:    General: Skin is warm and dry.     Findings: No rash.     Comments: Erythematous rash in the perineum no vesicular or pustular lesions  Neurological:     General: No focal deficit present.     Mental Status: She is alert.     Cranial Nerves: No cranial nerve  deficit, dysarthria or facial asymmetry.     Sensory: No sensory deficit.     Motor: No abnormal muscle tone or seizure activity.     Coordination: Coordination normal.  Psychiatric:        Mood and Affect: Mood normal.     (all labs ordered are listed, but only abnormal results are displayed) Labs Reviewed  COMPREHENSIVE METABOLIC PANEL WITH GFR - Abnormal;  Notable for the following components:      Result Value   Sodium 130 (*)    Potassium 3.4 (*)    Chloride 95 (*)    Glucose, Bld 119 (*)    Calcium  8.5 (*)    AST 45 (*)    ALT 80 (*)    All other components within normal limits  CBC WITH DIFFERENTIAL/PLATELET - Abnormal; Notable for the following components:   WBC 0.3 (*)    RBC 3.24 (*)    Hemoglobin 10.5 (*)    HCT 30.6 (*)    Platelets 24 (*)    Neutro Abs 0.0 (*)    Lymphs Abs 0.1 (*)    Monocytes Absolute 0.0 (*)    All other components within normal limits  URINALYSIS, W/ REFLEX TO CULTURE (INFECTION SUSPECTED) - Abnormal; Notable for the following components:   APPearance HAZY (*)    Hgb urine dipstick LARGE (*)    Protein, ur 100 (*)    Bacteria, UA RARE (*)    All other components within normal limits  I-STAT CG4 LACTIC ACID, ED - Abnormal; Notable for the following components:   Lactic Acid, Venous 2.1 (*)    All other components within normal limits  RESP PANEL BY RT-PCR (RSV, FLU A&B, COVID)  RVPGX2  CULTURE, BLOOD (ROUTINE X 2)  CULTURE, BLOOD (ROUTINE X 2)  URINE CULTURE  PROTIME-INR  PATHOLOGIST SMEAR REVIEW  I-STAT CG4 LACTIC ACID, ED  I-STAT CG4 LACTIC ACID, ED    EKG: None  Radiology: DG Chest 2 View if patient is not in a treatment room. Result Date: 03/26/2024 EXAM: 2 VIEW(S) XRAY OF THE CHEST 03/26/2024 01:52:50 PM COMPARISON: Chest x-ray dated 11/18/2017. CLINICAL HISTORY: Suspected Sepsis. FINDINGS: LUNGS AND PLEURA: No focal pulmonary opacity. No pleural effusion. No pneumothorax. HEART AND MEDIASTINUM: No acute abnormality of the  cardiac and mediastinal silhouettes. BONES AND SOFT TISSUES: No acute osseous abnormality. IMPRESSION: 1. No acute cardiopulmonary process. Electronically signed by: Morgane Naveau MD 03/26/2024 01:56 PM EST RP Workstation: [id number]   CT ABDOMEN PELVIS W CONTRAST Result Date: 03/25/2024 CLINICAL DATA:  Abdominal pain. Constipation. History of he female cancer. EXAM: CT ABDOMEN AND PELVIS WITH CONTRAST TECHNIQUE: Multidetector CT imaging of the abdomen and pelvis was performed using the standard protocol following bolus administration of intravenous contrast. RADIATION DOSE REDUCTION: This exam was performed according to the departmental dose-optimization program which includes automated exposure control, adjustment of the mA and/or kV according to patient size and/or use of iterative reconstruction technique. CONTRAST:  90mL OMNIPAQUE  IOHEXOL  300 MG/ML  SOLN COMPARISON:  CT abdomen pelvis dated 01/19/2024. FINDINGS: Lower chest: The visualized lung bases are clear. No intra-abdominal free air or free fluid. Hepatobiliary: The liver is unremarkable. No biliary dilatation. The gallbladder is unremarkable Pancreas: Unremarkable. No pancreatic ductal dilatation or surrounding inflammatory changes. Spleen: Normal in size without focal abnormality. Adrenals/Urinary Tract: The adrenal glands, kidneys, visualized ureters, and urinary bladder appear unremarkable. Stomach/Bowel: Evaluation of the bowel is limited in the absence of oral contrast. There is no bowel obstruction or active inflammation. There is moderate stool throughout the colon. Appendectomy. Vascular/Lymphatic: The abdominal aorta and IVC are unremarkable. No portal venous gas. No adenopathy. Reproductive: Calcified uterine fibroid. No suspicious adnexal masses. Other: None Musculoskeletal: No acute or significant osseous findings. IMPRESSION: 1. No acute intra-abdominal or pelvic pathology. 2. Moderate colonic stool burden. No bowel obstruction.  Electronically Signed   By: Vanetta Chou M.D.   On: 03/25/2024 16:22    {  Document cardiac monitor, telemetry assessment procedure when appropriate:32947} .Critical Care  Performed by: Randol Simmonds, MD Authorized by: Randol Simmonds, MD   Critical care provider statement:    Critical care time (minutes):  30   Critical care was time spent personally by me on the following activities:  Development of treatment plan with patient or surrogate, discussions with consultants, evaluation of patient's response to treatment, examination of patient, ordering and review of laboratory studies, ordering and review of radiographic studies, ordering and performing treatments and interventions, pulse oximetry, re-evaluation of patient's condition and review of old charts    Medications Ordered in the ED  lactated ringers  infusion ( Intravenous New Bag/Given 03/26/24 1405)  lactated ringers  bolus 1,000 mL (1,000 mLs Intravenous New Bag/Given 03/26/24 1405)    And  lactated ringers  bolus 500 mL (has no administration in time range)    And  lactated ringers  bolus 250 mL (has no administration in time range)  ceFEPIme  (MAXIPIME ) 2 g in sodium chloride  0.9 % 100 mL IVPB (0 g Intravenous Stopped 03/26/24 1434)    Clinical Course as of 03/26/24 1506  Sun Mar 26, 2024  1431 CBC with Differential(!!) Patient's counts now show a white count of 0.3.  Her platelet is a 24 her hemoglobin is 10.5.  Patient's presentation is consistent with neutropenic fever [JK]  1435 I-Stat Lactic Acid, ED(!!) Lactic acid elevated [JK]    Clinical Course User Index [JK] Randol Simmonds, MD   {Click here for ABCD2, HEART and other calculators REFRESH Note before signing:1}                              Medical Decision Making Amount and/or Complexity of Data Reviewed Labs: ordered. Decision-making details documented in ED Course. Radiology: ordered.  Risk Prescription drug management.   Patient presents ED for evaluation of  fever.  Patient undergoing chemotherapy for anal cancer.  Patient also has been taking Cipro  for UTI.  Patient was seen yesterday for evaluation of constipation.  Labs did show neutropenia but the patient did not have any evidence of fever at that time.  Plan was for close outpatient management.  Patient was given Neupogen   Patient spiked a fever today.  Her counts show persistent pancytopenia.  Her urinalysis does suggest infection.  Patient does have elevated lactic acid level but currently normotensive.  Rash in the perineum likely related to her radiation treatments.  Not likely to be her source of infection although some of the breakdown in the skin could be a potential source.  Patient has been started on broad-spectrum antibiotics.  I will consult with oncology and the medical service for admission and further treatment  {Document critical care time when appropriate  Document review of labs and clinical decision tools ie CHADS2VASC2, etc  Document your independent review of radiology images and any outside records  Document your discussion with family members, caretakers and with consultants  Document social determinants of health affecting pt's care  Document your decision making why or why not admission, treatments were needed:32947:::1}   Final diagnoses:  Neutropenic fever  Acute cystitis without hematuria  Rash    ED Discharge Orders     None

## 2024-03-26 NOTE — ED Triage Notes (Signed)
 Pt arrives via POV. She has anal cancer. She developed a fever this morning. She states she is currently on cipro  for uti. PT is AxOx4. Last chemo was last Friday.

## 2024-03-26 NOTE — Sepsis Progress Note (Addendum)
 Elink following code sepsis  1817 Istat lactic not drawn prior to ED moving patient to floor, message sent to bedside RN and attending asking for lactic acid to be ordered so lab will come collect.

## 2024-03-26 NOTE — H&P (Signed)
 History and Physical    Katrina [name] FMW:[id number] DOB: [birth year] DOA: 03/26/2024  PCP: [name], MD   Patient coming from: Home   Chief Complaint: Fever   HPI: Katrina [name] is a 68 y.o. female with medical history significant for rheumatoid arthritis, anal cancer on chemotherapy and radiation, chronic cancer-related pain, recent UTI due to Pseudomonas, and chemotherapy-induced neutropenia treated with Neupogen  yesterday and now presents with fever.  Patient was recently started on Bactrim  for UTI, culture grew pansensitive Pseudomonas, and she was switched to ciprofloxacin  which she has been taking since 03/23/2024.  She continues to have pain with urination but attributes this more to inflammatory changes from radiation therapy.  She does not have any new respiratory or GI symptoms.  She no longer has a PICC or other central catheter.  ED Course: Upon arrival to the ED, patient is found to be febrile to 38.2 C with mild tachycardia and stable blood pressure.  Labs are most notable for sodium 130, potassium 3.4, normal creatinine, AST 45, ALT 80, WBC 300, hemoglobin 10.5, platelets 24,000, lactic acid 2.1, and ANC 240.  Oncology (Dr. Timmy) was consulted by the ED physician, blood and urine cultures were collected, and the patient was treated with 1.25 L LR and cefepime .  Review of Systems:  All other systems reviewed and apart from HPI, are negative.  Past Medical History:  Diagnosis Date   Arthritis    --rheumatoid   Colon polyp    Osteoporosis 04/05/14   T score -2.6 spine   Salpingitis    Had appendectomy at same time as salpingitis dx was made.    Past Surgical History:  Procedure Laterality Date   APPENDECTOMY     BREAST EXCISIONAL BIOPSY Left    COLONOSCOPY     FINGER FRACTURE SURGERY Right 10/2000   left breast biopsy     POLYPECTOMY     TONSILLECTOMY AND ADENOIDECTOMY  2014    Social History:   reports that she quit smoking about 33 years ago. Her  smoking use included cigarettes. She started smoking about 53 years ago. She has a 20 pack-year smoking history. She has never used smokeless tobacco. She reports that she does not drink alcohol and does not use drugs.  No Known Allergies  Family History  Problem Relation Age of Onset   Thyroid disease Mother        thyroidectomy in her 20's--unsure of reason   Lung cancer Mother 56   Heart disease Father    Hypertension Father    Hyperlipidemia Father    Bladder Cancer Father 59   Breast cancer Maternal Aunt 29 - 3   Stroke Paternal Grandfather    Colon cancer Neg Hx    Pancreatic cancer Neg Hx    Esophageal cancer Neg Hx    Rectal cancer Neg Hx    Stomach cancer Neg Hx      Prior to Admission medications   Medication Sig Start Date End Date Taking? Authorizing Provider  Adalimumab (HUMIRA PEN) 40 MG/0.4ML PNKT  07/26/17   [provider]  aluminum  sulfate-calcium  acetate (DOMEBORO) packet Apply 1 packet topically 3 (three) times daily. 03/16/24   Maritza Stagger, MD  Ascorbic Acid (VITAMIN C) 1000 MG tablet Take 1,000 mg by mouth daily.    [provider]  calcium  carbonate (OS-CAL) 600 MG TABS Take 600 mg by mouth daily.    [provider]  cholecalciferol (VITAMIN D ) 1000 UNITS tablet Take by mouth daily.  [provider]  ciprofloxacin  (CIPRO ) 750 MG tablet Take 1 tablet (750 mg total) by mouth 2 (two) times daily. 03/23/24   Pasam, Chinita, MD  clonazePAM  (KLONOPIN ) 0.1 mg/mL SUSP Take 5 mLs (0.5 mg total) by mouth 2 (two) times daily as needed. 11/28/23   [name], MD  denosumab  (PROLIA ) 60 MG/ML SOSY injection Inject 60 mg into the skin every 6 (six) months.    [provider]  dicyclomine  (BENTYL ) 10 MG capsule Take 1 capsule (10 mg total) by mouth in the morning, at noon, in the evening, and at bedtime. 03/20/24   Maritza Stagger, MD  escitalopram  (LEXAPRO ) 10 MG tablet Take 1 tablet (10 mg total) by mouth daily. 02/03/24    Maritza Stagger, MD  folic acid  (FOLVITE ) 1 MG tablet Take 2 mg by mouth daily. 02/10/21   [provider]  hydrOXYzine  (ATARAX ) 10 MG tablet Take 1 tablet (10 mg total) by mouth 3 (three) times daily as needed for anxiety. 02/03/24   Maritza Stagger, MD  lactulose  (CHRONULAC ) 10 GM/15ML solution Take 30 mLs (20 g total) by mouth 2 (two) times daily as needed for mild constipation. 03/25/24   Henderly, Britni A, PA-C  LORazepam  (ATIVAN ) 0.5 MG tablet Take 1 tablet (0.5 mg total) by mouth at bedtime. 02/11/24   Pasam, Chinita, MD  magic mouthwash w/lidocaine  SOLN Swish and spit up to 4 times a day as needed for mouth pain. Suspension contains equal amounts of Maalox Extra Strength, nystatin, diphenhydramine and lidocaine . 02/23/24   Maritza Stagger, MD  meloxicam (MOBIC) 15 MG tablet Take 1 tablet by mouth as needed.    [provider]  methotrexate (RHEUMATREX) 2.5 MG tablet Take 15 mg by mouth once a week. Caution:Chemotherapy. Protect from light.  PRN    [provider]  morphine  (MS CONTIN ) 15 MG 12 hr tablet Take 1 tablet (15 mg total) by mouth every 12 (twelve) hours. 03/20/24   Maritza Stagger, MD  NON FORMULARY Diltiazem 2%/Lidocaine5% compound Apply to rectum 3 times daily for 8 weeks 01/11/24   Federico Rosario BROCKS, MD  ondansetron  (ZOFRAN ) 8 MG tablet Take 1 tablet (8 mg total) by mouth every 8 (eight) hours as needed for nausea or vomiting. 01/27/24   Pasam, Chinita, MD  oxyCODONE  (OXY IR/ROXICODONE ) 5 MG immediate release tablet Take 1 tablet (5 mg total) by mouth every 4 (four) hours as needed for severe pain (pain score 7-10). 01/31/24   Maritza Stagger, MD  oxyCODONE  (OXYCONTIN ) 10 mg 12 hr tablet Take 1 tablet (10 mg total) by mouth every 12 (twelve) hours. 03/20/24   Maritza Stagger, MD  prochlorperazine  (COMPAZINE ) 10 MG tablet Take 1 tablet (10 mg total) by mouth every 6 (six) hours as needed for nausea or vomiting. 01/27/24   Pasam, Chinita, MD  traMADol  (ULTRAM ) 50 MG  tablet Take 1 tablet (50 mg total) by mouth every 6 (six) hours as needed. 01/19/24   Shila Gustav GAILS, MD    Physical Exam: Vitals:   03/26/24 1237 03/26/24 1510  BP: 122/70 100/63  Pulse: (!) 109 (!) 104  Resp: 18 16  Temp: (!) 100.8 F (38.2 C) 98.5 F (36.9 C)  TempSrc: Oral Oral  SpO2: 93% 99%  Weight: 52.2 kg   Height: 5' 2 (1.575 m)     Constitutional: NAD, calm  Eyes: PERTLA, lids and conjunctivae normal ENMT: Mucous membranes are moist. Posterior pharynx clear of any exudate or lesions.   Neck: supple, no masses  Respiratory:  no wheezing, no crackles. No accessory muscle use.  Cardiovascular: S1 & S2 heard, regular rate and rhythm. No extremity edema.   Abdomen: No tenderness, soft. Bowel sounds active.  Musculoskeletal: no clubbing / cyanosis. No joint deformity upper and lower extremities.   Skin: no significant rashes, lesions, ulcers. Warm, dry, well-perfused. Neurologic: CN 2-12 grossly intact. Moving all extremities. Alert and oriented.  Psychiatric: Pleasant. Cooperative.    Labs and Imaging on Admission: I have personally reviewed following labs and imaging studies  CBC: Recent Labs  Lab 03/25/24 1416 03/26/24 1308  WBC 0.4* 0.3*  NEUTROABS 0.0* 0.0*  HGB 9.9* 10.5*  HCT 29.0* 30.6*  MCV 93.9 94.4  PLT 51* 24*   Basic Metabolic Panel: Recent Labs  Lab 03/25/24 1416 03/26/24 1308  NA 134* 130*  K 4.0 3.4*  CL 97* 95*  CO2 29 25  GLUCOSE 116* 119*  BUN 10 8  CREATININE 0.49 0.62  CALCIUM  9.0 8.5*   GFR: Estimated Creatinine Clearance: 52.5 mL/min (by C-G formula based on SCr of 0.62 mg/dL). Liver Function Tests: Recent Labs  Lab 03/25/24 1416 03/26/24 1308  AST 58* 45*  ALT 97* 80*  ALKPHOS 68 65  BILITOT 0.5 0.5  PROT 6.3* 6.6  ALBUMIN 3.2* 3.6   Recent Labs  Lab 03/25/24 1416  LIPASE 11   No results for input(s): AMMONIA in the last 168 hours. Coagulation Profile: Recent Labs  Lab 03/26/24 1308  INR 1.2    Cardiac Enzymes: No results for input(s): CKTOTAL, CKMB, CKMBINDEX, TROPONINI in the last 168 hours. BNP (last 3 results) No results for input(s): PROBNP in the last 8760 hours. HbA1C: No results for input(s): HGBA1C in the last 72 hours. CBG: No results for input(s): GLUCAP in the last 168 hours. Lipid Profile: No results for input(s): CHOL, HDL, LDLCALC, TRIG, CHOLHDL, LDLDIRECT in the last 72 hours. Thyroid Function Tests: No results for input(s): TSH, T4TOTAL, FREET4, T3FREE, THYROIDAB in the last 72 hours. Anemia Panel: No results for input(s): VITAMINB12, FOLATE, FERRITIN, TIBC, IRON, RETICCTPCT in the last 72 hours. Urine analysis:    Component Value Date/Time   COLORURINE YELLOW 03/26/2024 1308   APPEARANCEUR HAZY (A) 03/26/2024 1308   LABSPEC 1.024 03/26/2024 1308   PHURINE 5.0 03/26/2024 1308   GLUCOSEU NEGATIVE 03/26/2024 1308   HGBUR LARGE (A) 03/26/2024 1308   BILIRUBINUR NEGATIVE 03/26/2024 1308   BILIRUBINUR negative 06/11/2023 1624   BILIRUBINUR neg 05/14/2022 1550   KETONESUR NEGATIVE 03/26/2024 1308   PROTEINUR 100 (A) 03/26/2024 1308   UROBILINOGEN 0.2 06/11/2023 1624   NITRITE NEGATIVE 03/26/2024 1308   LEUKOCYTESUR NEGATIVE 03/26/2024 1308   Sepsis Labs: @LABRCNTIP (procalcitonin:4,lacticidven:4) ) Recent Results (from the past 240 hours)  Urine culture     Status: Abnormal   Collection Time: 03/22/24 11:46 AM   Specimen: Urine, Clean Catch  Result Value Ref Range Status   Specimen Description   Final    URINE, CLEAN CATCH Performed at [facility], [address] [place], [place] [address]    Special Requests   Final    NONE Performed at [facility], [address] [place], [place] [address]    Culture 70,000 COLONIES/mL PSEUDOMONAS AERUGINOSA (A)  Final   Report Status 03/24/2024 FINAL  Final   Organism ID, Bacteria PSEUDOMONAS AERUGINOSA (A)   Final      Susceptibility   Pseudomonas aeruginosa - MIC*    MEROPENEM <=0.25 SENSITIVE Sensitive     CIPROFLOXACIN  0.12  SENSITIVE Sensitive     IMIPENEM 2 SENSITIVE Sensitive     PIP/TAZO Value in next row Sensitive      <=4 SENSITIVEThis is a modified FDA-approved test that has been validated and its performance characteristics determined by the reporting laboratory.  This laboratory is certified under the Clinical Laboratory Improvement Amendments CLIA as qualified to perform high complexity clinical laboratory testing.    CEFEPIME  Value in next row Sensitive      <=4 SENSITIVEThis is a modified FDA-approved test that has been validated and its performance characteristics determined by the reporting laboratory.  This laboratory is certified under the Clinical Laboratory Improvement Amendments CLIA as qualified to perform high complexity clinical laboratory testing.    CEFTAZIDIME/AVIBACTAM Value in next row Sensitive      <=4 SENSITIVEThis is a modified FDA-approved test that has been validated and its performance characteristics determined by the reporting laboratory.  This laboratory is certified under the Clinical Laboratory Improvement Amendments CLIA as qualified to perform high complexity clinical laboratory testing.    CEFTOLOZANE/TAZOBACTAM Value in next row Sensitive      <=4 SENSITIVEThis is a modified FDA-approved test that has been validated and its performance characteristics determined by the reporting laboratory.  This laboratory is certified under the Clinical Laboratory Improvement Amendments CLIA as qualified to perform high complexity clinical laboratory testing.    TOBRAMYCIN Value in next row Sensitive      <=4 SENSITIVEThis is a modified FDA-approved test that has been validated and its performance characteristics determined by the reporting laboratory.  This laboratory is certified under the Clinical Laboratory Improvement Amendments CLIA as qualified to perform high  complexity clinical laboratory testing.    CEFTAZIDIME Value in next row Sensitive      <=4 SENSITIVEThis is a modified FDA-approved test that has been validated and its performance characteristics determined by the reporting laboratory.  This laboratory is certified under the Clinical Laboratory Improvement Amendments CLIA as qualified to perform high complexity clinical laboratory testing.    * 70,000 COLONIES/mL PSEUDOMONAS AERUGINOSA  Resp panel by RT-PCR (RSV, Flu A&B, Covid) Anterior Nasal Swab     Status: None   Collection Time: 03/26/24  2:09 PM   Specimen: Anterior Nasal Swab  Result Value Ref Range Status   SARS Coronavirus 2 by RT PCR NEGATIVE NEGATIVE Final    Comment: (NOTE) SARS-CoV-2 target nucleic acids are NOT DETECTED.  The SARS-CoV-2 RNA is generally detectable in upper respiratory specimens during the acute phase of infection. The lowest concentration of SARS-CoV-2 viral copies this assay can detect is 138 copies/mL. A negative result does not preclude SARS-Cov-2 infection and should not be used as the sole basis for treatment or other patient management decisions. A negative result may occur with  improper specimen collection/handling, submission of specimen other than nasopharyngeal swab, presence of viral mutation(s) within the areas targeted by this assay, and inadequate number of viral copies(<138 copies/mL). A negative result must be combined with clinical observations, patient history, and epidemiological information. The expected result is Negative.  Fact Sheet for Patients:  bloggercourse.com  Fact Sheet for Healthcare Providers:  seriousbroker.it  This test is no t yet approved or cleared by the [place]  FDA and  has been authorized for detection and/or diagnosis of SARS-CoV-2 by FDA under an Emergency Use Authorization (EUA). This EUA will remain  in effect (meaning this test can be used) for the  duration of the COVID-19 declaration under Section 564(b)(1) of the Act, 21 U.S.C.section 360bbb-3(b)(1), unless  the authorization is terminated  or revoked sooner.       Influenza A by PCR NEGATIVE NEGATIVE Final   Influenza B by PCR NEGATIVE NEGATIVE Final    Comment: (NOTE) The Xpert Xpress SARS-CoV-2/FLU/RSV plus assay is intended as an aid in the diagnosis of influenza from Nasopharyngeal swab specimens and should not be used as a sole basis for treatment. Nasal washings and aspirates are unacceptable for Xpert Xpress SARS-CoV-2/FLU/RSV testing.  Fact Sheet for Patients: bloggercourse.com  Fact Sheet for Healthcare Providers: seriousbroker.it  This test is not yet approved or cleared by the [place]  FDA and has been authorized for detection and/or diagnosis of SARS-CoV-2 by FDA under an Emergency Use Authorization (EUA). This EUA will remain in effect (meaning this test can be used) for the duration of the COVID-19 declaration under Section 564(b)(1) of the Act, 21 U.S.C. section 360bbb-3(b)(1), unless the authorization is terminated or revoked.     Resp Syncytial Virus by PCR NEGATIVE NEGATIVE Final    Comment: (NOTE) Fact Sheet for Patients: bloggercourse.com  Fact Sheet for Healthcare Providers: seriousbroker.it  This test is not yet approved or cleared by the [place]  FDA and has been authorized for detection and/or diagnosis of SARS-CoV-2 by FDA under an Emergency Use Authorization (EUA). This EUA will remain in effect (meaning this test can be used) for the duration of the COVID-19 declaration under Section 564(b)(1) of the Act, 21 U.S.C. section 360bbb-3(b)(1), unless the authorization is terminated or revoked.  Performed at [facility], [address], [place], [place] [address]      Radiological Exams on Admission: DG Chest  2 View if patient is not in a treatment room. Result Date: 03/26/2024 EXAM: 2 VIEW(S) XRAY OF THE CHEST 03/26/2024 01:52:50 PM COMPARISON: Chest x-ray dated 11/18/2017. CLINICAL HISTORY: Suspected Sepsis. FINDINGS: LUNGS AND PLEURA: No focal pulmonary opacity. No pleural effusion. No pneumothorax. HEART AND MEDIASTINUM: No acute abnormality of the cardiac and mediastinal silhouettes. BONES AND SOFT TISSUES: No acute osseous abnormality. IMPRESSION: 1. No acute cardiopulmonary process. Electronically signed by: Morgane Naveau MD 03/26/2024 01:56 PM EST RP Workstation: [id number]   CT ABDOMEN PELVIS W CONTRAST Result Date: 03/25/2024 CLINICAL DATA:  Abdominal pain. Constipation. History of he female cancer. EXAM: CT ABDOMEN AND PELVIS WITH CONTRAST TECHNIQUE: Multidetector CT imaging of the abdomen and pelvis was performed using the standard protocol following bolus administration of intravenous contrast. RADIATION DOSE REDUCTION: This exam was performed according to the departmental dose-optimization program which includes automated exposure control, adjustment of the mA and/or kV according to patient size and/or use of iterative reconstruction technique. CONTRAST:  90mL OMNIPAQUE  IOHEXOL  300 MG/ML  SOLN COMPARISON:  CT abdomen pelvis dated 01/19/2024. FINDINGS: Lower chest: The visualized lung bases are clear. No intra-abdominal free air or free fluid. Hepatobiliary: The liver is unremarkable. No biliary dilatation. The gallbladder is unremarkable Pancreas: Unremarkable. No pancreatic ductal dilatation or surrounding inflammatory changes. Spleen: Normal in size without focal abnormality. Adrenals/Urinary Tract: The adrenal glands, kidneys, visualized ureters, and urinary bladder appear unremarkable. Stomach/Bowel: Evaluation of the bowel is limited in the absence of oral contrast. There is no bowel obstruction or active inflammation. There is moderate stool throughout the colon. Appendectomy. Vascular/Lymphatic:  The abdominal aorta and IVC are unremarkable. No portal venous gas. No adenopathy. Reproductive: Calcified uterine fibroid. No suspicious adnexal masses. Other: None Musculoskeletal: No acute or significant osseous findings. IMPRESSION: 1. No acute intra-abdominal or pelvic pathology. 2. Moderate colonic stool burden. No bowel obstruction. Electronically  Signed   By: Arash  Radparvar M.D.   On: 03/25/2024 16:22    EKG: Independently reviewed. Sinus rhythm.   Assessment/Plan   1. Neutropenia with fever  - Presents with fever, ANC is 240  - She had UTI due to pansensitive Pseudomonas treated with ciprofloxacin  since 11/20 - Blood and urine cultures collected in ED and cefepime  started  - Continue cefepime , follow cultures and clinical course, use neutropenic precautions    2. Pancytopenia  - WBC 300 with ANC 240, Hgb 10.5, and platelets 24,000    - Likely from chemotherapy, received Neupogen  03/25/24, will repeat CBC in am    3. Anal cancer  - Under the care of Dr. Autumn, receiving chemotherapy and radiation    4. Hyponatremia  - Mild, in setting of hypovolemia  - Continue IVF hydration, repeat chem panel in am     5. Constipation  - Started lactulose  last night, will continue   6. Chronic pain  - Continue long-acting morphine  and as-needed oxycodone     7. RA - Previously managed with methotrexate and Humira, has been off of these during cancer treatment    DVT prophylaxis: SCDs  Code Status: Full  Level of Care: Level of care: Telemetry Family Communication: Husband at bedside   Disposition Plan:  Patient is from: Home  Anticipated d/c is to: Home  Anticipated d/c date is: 03/29/24  Patient currently: Pending management of neutropenic fever  Consults called: Oncology  Admission status: Inpatient     Katrina GORMAN Sprinkles, MD [facility]  03/26/2024, 3:28 PM

## 2024-03-26 NOTE — Plan of Care (Signed)

## 2024-03-26 NOTE — Radiation Completion Notes (Signed)
 Patient Name: [name], Katrina MRN: [id number] Date of Birth: 1956/02/01 Referring Physician: CHINITA PATTEN, M.D. Date of Service: 2024-03-26 Radiation Oncologist: Estefana Cha, M.D. 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, anus and anal canal (HCC) T=cT2, N=cN1a, M=cM0 Intent: Curative     ==========DELIVERED PLANS==========  First Treatment Date: 2024-02-14 Last Treatment Date: 2024-03-24   Plan Name: Anus Site: Anus Technique: IMRT Mode: Photon Dose Per Fraction: 1.8 Gy Prescribed Dose (Delivered / Prescribed): 54 Gy / 54 Gy Prescribed Fxs (Delivered / Prescribed): 30 / 30     ==========ON TREATMENT VISIT DATES========== 2024-02-16, 2024-02-23, 2024-03-01, 2024-03-08, 2024-03-16, 2024-03-22     ==========UPCOMING VISITS========== 06/12/2024 [facility] PHYSICAL [name], MD  06/08/2024 [facility] VISIT [facility]  05/08/2024 [facility] Helmus, Jitka, PT  04/24/2024 [facility] Helmus, Jitka, PT  04/20/2024 [facility] Helmus, Jitka, PT  04/19/2024 [facility] EST PT 15 Pasam, Avinash, MD  04/19/2024 [facility]  04/13/2024 [facility] Helmus, Jitka, PT  04/06/2024 [facility] Helmus, Jitka, PT  04/05/2024 [facility]  03/28/2024 [facility] [address] [place], [place]  03/26/2024 [facility] DG X-RAY [facility]        ==========APPENDIX - ON TREATMENT VISIT NOTES==========   See weekly On Treatment Notes in Epic for details in the Media tab (listed as Progress  notes on the On Treatment Visit Dates listed above).

## 2024-03-27 DIAGNOSIS — D701 Agranulocytosis secondary to cancer chemotherapy: Secondary | ICD-10-CM | POA: Diagnosis not present

## 2024-03-27 DIAGNOSIS — D696 Thrombocytopenia, unspecified: Secondary | ICD-10-CM | POA: Diagnosis not present

## 2024-03-27 DIAGNOSIS — D709 Neutropenia, unspecified: Secondary | ICD-10-CM | POA: Diagnosis not present

## 2024-03-27 DIAGNOSIS — R5081 Fever presenting with conditions classified elsewhere: Secondary | ICD-10-CM | POA: Diagnosis not present

## 2024-03-27 DIAGNOSIS — N3000 Acute cystitis without hematuria: Secondary | ICD-10-CM | POA: Diagnosis not present

## 2024-03-27 DIAGNOSIS — T451X5A Adverse effect of antineoplastic and immunosuppressive drugs, initial encounter: Secondary | ICD-10-CM

## 2024-03-27 LAB — HEPATIC FUNCTION PANEL
ALT: 54 U/L — ABNORMAL HIGH (ref 0–44)
AST: 28 U/L (ref 15–41)
Albumin: 2.7 g/dL — ABNORMAL LOW (ref 3.5–5.0)
Alkaline Phosphatase: 52 U/L (ref 38–126)
Bilirubin, Direct: 0.3 mg/dL — ABNORMAL HIGH (ref 0.0–0.2)
Indirect Bilirubin: 0.2 mg/dL — ABNORMAL LOW (ref 0.3–0.9)
Total Bilirubin: 0.5 mg/dL (ref 0.0–1.2)
Total Protein: 5.6 g/dL — ABNORMAL LOW (ref 6.5–8.1)

## 2024-03-27 LAB — CBC WITH DIFFERENTIAL/PLATELET
Abs Immature Granulocytes: 0.02 K/uL (ref 0.00–0.07)
Abs Immature Granulocytes: 0.02 K/uL (ref 0.00–0.07)
Basophils Absolute: 0 K/uL (ref 0.0–0.1)
Basophils Absolute: 0 K/uL (ref 0.0–0.1)
Basophils Relative: 3 %
Basophils Relative: 0 %
Eosinophils Absolute: 0.1 K/uL (ref 0.0–0.5)
Eosinophils Absolute: 0.2 K/uL (ref 0.0–0.5)
Eosinophils Relative: 18 %
Eosinophils Relative: 36 %
HCT: 28.1 % — ABNORMAL LOW (ref 36.0–46.0)
HCT: 25.1 % — ABNORMAL LOW (ref 36.0–46.0)
Hemoglobin: 9.3 g/dL — ABNORMAL LOW (ref 12.0–15.0)
Hemoglobin: 8.4 g/dL — ABNORMAL LOW (ref 12.0–15.0)
Immature Granulocytes: 6 %
Immature Granulocytes: 5 %
Lymphocytes Relative: 46 %
Lymphocytes Relative: 37 %
Lymphs Abs: 0.2 K/uL — ABNORMAL LOW (ref 0.7–4.0)
Lymphs Abs: 0.2 K/uL — ABNORMAL LOW (ref 0.7–4.0)
MCH: 32.2 pg (ref 26.0–34.0)
MCH: 31.9 pg (ref 26.0–34.0)
MCHC: 33.1 g/dL (ref 30.0–36.0)
MCHC: 33.5 g/dL (ref 30.0–36.0)
MCV: 97.2 fL (ref 80.0–100.0)
MCV: 95.4 fL (ref 80.0–100.0)
Monocytes Absolute: 0.1 K/uL (ref 0.1–1.0)
Monocytes Absolute: 0 K/uL — ABNORMAL LOW (ref 0.1–1.0)
Monocytes Relative: 15 %
Monocytes Relative: 10 %
Neutro Abs: 0 K/uL — CL (ref 1.7–7.7)
Neutro Abs: 0.1 K/uL — CL (ref 1.7–7.7)
Neutrophils Relative %: 12 %
Neutrophils Relative %: 12 %
Platelets: 11 K/uL — CL (ref 150–400)
Platelets: 12 K/uL — CL (ref 150–400)
RBC: 2.89 MIL/uL — ABNORMAL LOW (ref 3.87–5.11)
RBC: 2.63 MIL/uL — ABNORMAL LOW (ref 3.87–5.11)
RDW: 12.6 % (ref 11.5–15.5)
RDW: 12.9 % (ref 11.5–15.5)
Smear Review: NORMAL
Smear Review: NORMAL
WBC: 0.3 K/uL — CL (ref 4.0–10.5)
WBC: 0.4 K/uL — CL (ref 4.0–10.5)
nRBC: 0 % (ref 0.0–0.2)
nRBC: 0 % (ref 0.0–0.2)

## 2024-03-27 LAB — URINE CULTURE: Culture: NO GROWTH

## 2024-03-27 LAB — BASIC METABOLIC PANEL WITH GFR
Anion gap: 9 (ref 5–15)
BUN: 7 mg/dL — ABNORMAL LOW (ref 8–23)
CO2: 23 mmol/L (ref 22–32)
Calcium: 8.2 mg/dL — ABNORMAL LOW (ref 8.9–10.3)
Chloride: 98 mmol/L (ref 98–111)
Creatinine, Ser: 0.54 mg/dL (ref 0.44–1.00)
GFR, Estimated: >60 mL/min (ref >60)
Glucose, Bld: 133 mg/dL — ABNORMAL HIGH (ref 70–99)
Potassium: 3.5 mmol/L (ref 3.5–5.1)
Sodium: 130 mmol/L — ABNORMAL LOW (ref 135–145)

## 2024-03-27 LAB — TYPE AND SCREEN
ABO/RH(D): O POS
Antibody Screen: NEGATIVE

## 2024-03-27 LAB — ABO/RH: ABO/RH(D): O POS

## 2024-03-27 LAB — MAGNESIUM: Magnesium: 2.2 mg/dL (ref 1.7–2.4)

## 2024-03-27 MED ORDER — IPRATROPIUM-ALBUTEROL 0.5-2.5 (3) MG/3ML IN SOLN: 3.0000 mL | RESPIRATORY_TRACT | Status: DC | PRN

## 2024-03-27 MED ORDER — HYDRALAZINE HCL 20 MG/ML IJ SOLN: 10.0000 mg | INTRAMUSCULAR | Status: DC | PRN

## 2024-03-27 MED ORDER — FILGRASTIM-AAFI 300 MCG/0.5ML IJ SOSY
300.0000 ug | PREFILLED_SYRINGE | Freq: Every day | INTRAMUSCULAR | Status: DC
  Administered 2024-03-27 – 2024-03-30 (×4): 300 ug via SUBCUTANEOUS
  Filled 2024-03-27 (×4): qty 0.5

## 2024-03-27 MED ORDER — SODIUM CHLORIDE 0.9% IV SOLUTION: Freq: Once | INTRAVENOUS | Status: AC

## 2024-03-27 MED ORDER — GLUCAGON HCL RDNA (DIAGNOSTIC) 1 MG IJ SOLR: 1.0000 mg | INTRAMUSCULAR | Status: DC | PRN

## 2024-03-27 MED ORDER — DOCUSATE SODIUM 100 MG PO CAPS
100.0000 mg | ORAL_CAPSULE | Freq: Two times a day (BID) | ORAL | Status: DC
  Administered 2024-03-27 – 2024-03-30 (×7): 100 mg via ORAL
  Filled 2024-03-27 (×7): qty 1

## 2024-03-27 MED ORDER — BISACODYL 5 MG PO TBEC: 10.0000 mg | DELAYED_RELEASE_TABLET | Freq: Every day | ORAL | Status: DC

## 2024-03-27 NOTE — TOC Initial Note (Signed)
 Transition of Care [facility]) - Initial/Assessment Note    Patient Details  Name: Katrina [name] MRN: [id number] Date of Birth: November 13, 1955  Transition of Care [facility]) CM/SW Contact:    Bascom Service, RN Phone Number: 03/27/2024, 3:02 PM  Clinical Narrative:  d/c plan home. Has own transport home.                 Expected Discharge Plan: Home/Self Care Barriers to Discharge: Continued Medical Work up   Patient Goals and CMS Choice Patient states their goals for this hospitalization and ongoing recovery are:: Home CMS Medicare.gov Compare Post Acute Care list provided to:: Patient Choice offered to / list presented to : Patient [place] ownership interest in [facility].provided to:: Patient    Expected Discharge Plan and Services   Discharge Planning Services: CM Consult   Living arrangements for the past 2 months: Single Family Home                                      Prior Living Arrangements/Services Living arrangements for the past 2 months: Single Family Home                     Activities of Daily Living   ADL Screening (condition at time of admission) Independently performs ADLs?: No Does the patient have a NEW difficulty with bathing/dressing/toileting/self-feeding that is expected to last >3 days?: No Does the patient have a NEW difficulty with getting in/out of bed, walking, or climbing stairs that is expected to last >3 days?: No Does the patient have a NEW difficulty with communication that is expected to last >3 days?: No Is the patient deaf or have difficulty hearing?: No Does the patient have difficulty seeing, even when wearing glasses/contacts?: No Does the patient have difficulty concentrating, remembering, or making decisions?: No  Permission Sought/Granted                  Emotional Assessment              Admission diagnosis:  Rash [R21] Neutropenia with fever [D70.9, R50.81] Acute cystitis without hematuria  [N30.00] Neutropenic fever [D70.9, R50.81] Patient Active Problem List   Diagnosis Date Noted   Neutropenia with fever 03/26/2024   UTI (urinary tract infection) 03/26/2024   Pancytopenia (HCC) 03/26/2024   Hyponatremia 03/26/2024   Constipation 03/26/2024   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 AT CORNER OF [facility] [address] [place] [address] Phone: [phone] Fax: [phone]  [facility] - [place], [place] - [address] [place] [address] Phone: [phone] Fax: [phone]  [facility] [place], [place] - [address] [place] [address] Phone: [phone] Fax: [phone]  [id number] [place], [place] - [address] [place] [address] Phone: [phone] Fax: [phone]  [id number] [place], [place] -  [address] AT [facility] [address] [place] [address] Phone: [phone] Fax: [phone]  [place] [facility] [address] [place] [address] Phone: [phone] Fax: [phone]     Social Drivers of Health (SDOH) Social History: SDOH Screenings   Food Insecurity: No Food Insecurity (03/26/2024)  Housing: Low Risk  (03/26/2024)  Transportation Needs: No Transportation Needs (03/26/2024)  Utilities: Not At Risk  (03/26/2024)  Alcohol Screen: Low Risk  (05/10/2020)  Depression (PHQ2-9): Low Risk  (03/17/2024)  Financial Resource Strain: Low Risk  (11/18/2023)  Physical Activity: Sufficiently Active (11/18/2023)  Social Connections: Moderately Isolated (03/26/2024)  Stress: No Stress Concern Present (11/18/2023)  Tobacco Use: Medium Risk (03/26/2024)  Health Literacy: Adequate Health Literacy (05/12/2023)   SDOH Interventions:     Readmission Risk Interventions     No data to display

## 2024-03-27 NOTE — Progress Notes (Cosign Needed)
 Katrina [name]   DOB:27-Jul-1955   FM#:[id number]      ASSESSMENT & PLAN:  Katrina [name] is a 68 year old female patient admitted on 03/26/2024 from home with complaints of fever.  Oncologic history is significant for anal cancer.  Medical oncology/Dr. Pasam following closely.  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 - PET scan 02/02/2024 showed no evidence of distant mets. -On concurrent chemo/radiation with 5-FU + mitomycin .  Cycle 1 initiated 02/14/2024. - Medical oncology/Dr. Pasam following.  Pancytopenia: Neutropenic fever - WBC 0.3 with ANC 0.1 - Continue G-CSF 300 mcg subcu daily until ANC >1500 - Continue IV antibiotics as ordered - Monitor fever curve Anemia - Hemoglobin 9.3, stable - Recommend PRBC transfusion for hemoglobin <7.0 Thrombocytopenia - Platelets low 11K - Recommend platelet transfusion for counts <20 K.  Ordered 1 unit platelets to be transfused today. - Continue to monitor CBC with differential daily  Pain, rectal/anal area - Likely radiation-induced - Continue pain meds as ordered - Continue supportive care for symptom relief  History of RA -- Meds on hold at this time. Will reevaluate as outpatient.    Code Status Full   Subjective:  Patient seen awake and alert laying in bed.  Admits to ongoing pain/patient points to rectal area; however feeling better. States she has been having good bowel movement. Aware that she had large stool burden on imaging.  Patient is very pleasant.  Denies other acute pain.  Spouse at bedside.  Objective:   Intake/Output Summary (Last 24 hours) at 03/27/2024 1035 Last data filed at 03/27/2024 0559 Gross per 24 hour  Intake 2951.13 ml  Output --  Net 2951.13 ml     PHYSICAL EXAMINATION: ECOG PERFORMANCE STATUS: 3 - Symptomatic, >50% confined to bed  Vitals:   03/27/24 0512 03/27/24 0636  BP: 102/69   Pulse: 88    Resp: 14 12  Temp: 98.4 F (36.9 C)   SpO2: 97%    Filed Weights   03/26/24 1237 03/26/24 1623  Weight: 115 lb (52.2 kg) 121 lb 11.1 oz (55.2 kg)    GENERAL: alert, no distress and comfortable SKIN: +Pale skin color, texture, turgor are normal, no rashes or significant lesions EYES: normal, conjunctiva are pink and non-injected, sclera clear OROPHARYNX: no exudate, no erythema and lips, buccal mucosa, and tongue normal  NECK: supple, thyroid normal size, non-tender, without nodularity LYMPH: no palpable lymphadenopathy in the cervical, axillary or inguinal LUNGS: clear to auscultation and percussion with normal breathing effort HEART: regular rate & rhythm and no murmurs and no lower extremity edema ABDOMEN: abdomen soft, non-tender and normal bowel sounds MUSCULOSKELETAL: no cyanosis of digits and no clubbing  PSYCH: alert & oriented x 3 with fluent speech NEURO: no focal motor/sensory deficits   All questions were answered. The patient knows to call the clinic with any problems, questions or concerns.   The total time spent in the appointment was 40 minutes encounter with patient including review of chart and various tests results, discussions about plan of care and coordination of care plan  Olam Katrina Brunner, NP 03/27/2024 10:35 AM    Labs Reviewed:  Lab Results  Component Value Date   WBC 0.4 (LL) 03/27/2024   HGB 8.4 (L) 03/27/2024   HCT 25.1 (L) 03/27/2024   MCV 95.4 03/27/2024   PLT 12 (LL) 03/27/2024   Recent Labs    03/25/24 1416 03/26/24 1308 03/27/24 0539  NA 134*  130* 130*  K 4.0 3.4* 3.5  CL 97* 95* 98  CO2 29 25 23   GLUCOSE 116* 119* 133*  BUN 10 8 7*  CREATININE 0.49 0.62 0.54  CALCIUM  9.0 8.5* 8.2*  GFRNONAA >60 >60 >60  PROT 6.3* 6.6 5.6*  ALBUMIN 3.2* 3.6 2.7*  AST 58* 45* 28  ALT 97* 80* 54*  ALKPHOS 68 65 52  BILITOT 0.5 0.5 0.5  BILIDIR  --   --  0.3*  IBILI  --   --  0.2*    Studies Reviewed:  DG Chest 2 View if patient is not in  a treatment room. Result Date: 03/26/2024 EXAM: 2 VIEW(S) XRAY OF THE CHEST 03/26/2024 01:52:50 PM COMPARISON: Chest x-ray dated 11/18/2017. CLINICAL HISTORY: Suspected Sepsis. FINDINGS: LUNGS AND PLEURA: No focal pulmonary opacity. No pleural effusion. No pneumothorax. HEART AND MEDIASTINUM: No acute abnormality of the cardiac and mediastinal silhouettes. BONES AND SOFT TISSUES: No acute osseous abnormality. IMPRESSION: 1. No acute cardiopulmonary process. Electronically signed by: Morgane Naveau MD 03/26/2024 01:56 PM EST RP Workstation: [id number]   CT ABDOMEN PELVIS W CONTRAST Result Date: 03/25/2024 CLINICAL DATA:  Abdominal pain. Constipation. History of he female cancer. EXAM: CT ABDOMEN AND PELVIS WITH CONTRAST TECHNIQUE: Multidetector CT imaging of the abdomen and pelvis was performed using the standard protocol following bolus administration of intravenous contrast. RADIATION DOSE REDUCTION: This exam was performed according to the departmental dose-optimization program which includes automated exposure control, adjustment of the mA and/or kV according to patient size and/or use of iterative reconstruction technique. CONTRAST:  90mL OMNIPAQUE  IOHEXOL  300 MG/ML  SOLN COMPARISON:  CT abdomen pelvis dated 01/19/2024. FINDINGS: Lower chest: The visualized lung bases are clear. No intra-abdominal free air or free fluid. Hepatobiliary: The liver is unremarkable. No biliary dilatation. The gallbladder is unremarkable Pancreas: Unremarkable. No pancreatic ductal dilatation or surrounding inflammatory changes. Spleen: Normal in size without focal abnormality. Adrenals/Urinary Tract: The adrenal glands, kidneys, visualized ureters, and urinary bladder appear unremarkable. Stomach/Bowel: Evaluation of the bowel is limited in the absence of oral contrast. There is no bowel obstruction or active inflammation. There is moderate stool throughout the colon. Appendectomy. Vascular/Lymphatic: The abdominal aorta and IVC  are unremarkable. No portal venous gas. No adenopathy. Reproductive: Calcified uterine fibroid. No suspicious adnexal masses. Other: None Musculoskeletal: No acute or significant osseous findings. IMPRESSION: 1. No acute intra-abdominal or pelvic pathology. 2. Moderate colonic stool burden. No bowel obstruction. Electronically Signed   By: Vanetta Chou M.D.   On: 03/25/2024 16:22   IR PICC PLACEMENT RIGHT >5 YRS INC IMG GUIDE Result Date: 03/10/2024 INDICATION: Patient with history of anal cancer; central venous access requested for chemotherapy EXAM: RIGHT UPPER EXTREMITY PICC LINE PLACEMENT WITH ULTRASOUND AND FLUOROSCOPIC GUIDANCE MEDICATIONS: 3 mL 1% lidocaine  with epinephrine  to skin/subcutaneous tissue ANESTHESIA/SEDATION: None FLUOROSCOPY: Radiation Exposure Index (as provided by the fluoroscopic device): 1 mGy Kerma COMPLICATIONS: None immediate. PROCEDURE: The patient was advised of the possible risks and complications and agreed to undergo the procedure. The patient was then brought to the angiographic suite for the procedure. The right arm was prepped with chlorhexidine, draped in the usual sterile fashion using maximum barrier technique (cap and mask, sterile gown, sterile gloves, large sterile sheet, hand hygiene and cutaneous antisepsis) and infiltrated locally with 1% Lidocaine  with epinephrine . Ultrasound demonstrated patency of the right brachial vein, and this was documented with an image. Under real-time ultrasound guidance, this vein was accessed with a 21 gauge micropuncture needle and image documentation was  performed. A 0.018 wire was introduced in to the vein. Over this, a 5 French dual lumen power injectable PICC was advanced to the lower SVC/right atrial junction. Fluoroscopy during the procedure and fluoro spot radiograph confirms appropriate catheter position. The catheter was flushed and covered with a isterile dressing. Catheter length: 34 cm IMPRESSION: Successful right arm  power PICC line placement with ultrasound and fluoroscopic guidance. The catheter is ready for use. Performed by: Franky Rakers, PA-C Electronically Signed   By: Marcey Moan M.D.   On: 03/10/2024 09:13

## 2024-03-27 NOTE — Hospital Course (Addendum)
 Brief Narrative:   68 year old with history of rheumatoid arthritis, anal cancer on chemoradiation, chronic pain, recent UTI due to Pseudomonas, chemo-induced neutropenia treated with Neupogen  day prior to admission presenting with fever.  Patient was recently started on Bactrim  for UTI which grew pansensitive Pseudomonas thereafter switched to ciprofloxacin  on 11/20.  Assessment & Plan:   Neutropenic fever Pancytopenia Recent urinary tract infection, pansensitive Pseudomonas -Follow culture data.  Patient was started on ciprofloxacin  on 11/20.  Now started on cefepime . CXR and CT AP showed mod stool burden. 1U Plt transfused, GSF ordered. Onc Recommends to keep ptn until Thursday but can go after her GSF that day if she remaisn stable.  - Continue to monitor fever curve, labs and cultures -Completed radiation treatment 03/24/2024   Anal cancer Initially diagnosed in September 2025 which was metastatic to local lymph nodes.  Underwent chemotherapy followed by radiation.  Oncology team to see this patient.   Hyponatremia, hypovolemia -IV fluids   Moderate constipation - Improved now continue daily Colace   Chronic pain  - Continue long-acting morphine  and as-needed oxycodone      Rheumatoid arthritis Currently Humira and methotrexate were on hold as patient was on chemotherapy.  Plans to follow back up with outpatient rheumatology to discuss resuming appropriate treatment.     DVT prophylaxis: SCDs  Code Status: Full  Family Communication: Husband at bedside Status is: Inpatient Remains inpatient appropriate because: Ongoing management for pancytopenia.  Hopefully home in next 24-48 hours   PT Follow up Recs:   Subjective:  Feels better than yesterday.  She was able to ambulate.  Remains afebrile  Examination:  General exam: Appears calm and comfortable  Respiratory system: Clear to auscultation. Respiratory effort normal. Cardiovascular system: S1 & S2 heard, RRR. No  JVD, murmurs, rubs, gallops or clicks. No pedal edema. Gastrointestinal system: Abdomen is nondistended, soft and nontender. No organomegaly or masses felt. Normal bowel sounds heard. Central nervous system: Alert and oriented. No focal neurological deficits. Extremities: Symmetric 5 x 5 power. Skin: No rashes, lesions or ulcers Psychiatry: Judgement and insight appear normal. Mood & affect appropriate.

## 2024-03-27 NOTE — Progress Notes (Signed)
 PROGRESS NOTE    Katrina [name]  FMW:[id number] DOB: 1955/09/26 DOA: 03/26/2024 PCP: [name], MD    Brief Narrative:   68 year old with history of rheumatoid arthritis, anal cancer on chemoradiation, chronic pain, recent UTI due to Pseudomonas, chemo-induced neutropenia treated with Neupogen  day prior to admission presenting with fever.  Patient was recently started on Bactrim  for UTI which grew pansensitive Pseudomonas thereafter switched to ciprofloxacin  on 11/20.  Assessment & Plan:   Neutropenic fever Pancytopenia Recent urinary tract infection, pansensitive Pseudomonas -Follow culture data.  Patient was started on ciprofloxacin  on 11/20.  Now started on cefepime  - Continue to monitor fever curve, labs and cultures -Oncology team notified.  Ordered GSF and platelet transfusion -Chest x-ray and CT abdomen pelvis overall unremarkable besides moderate stool burden -Completed radiation treatment 03/24/2024   Anal cancer Initially diagnosed in September 2025 which was metastatic to local lymph nodes.  Underwent chemotherapy followed by radiation.  Oncology team to see this patient.   Hyponatremia, hypovolemia -IV fluids   Moderate constipation - Improved now continue daily Colace   Chronic pain  - Continue long-acting morphine  and as-needed oxycodone      Rheumatoid arthritis Currently Humira and methotrexate were on hold as patient was on chemotherapy.  Plans to follow back up with outpatient rheumatology to discuss resuming appropriate treatment.     DVT prophylaxis: SCDs  Code Status: Full  Family Communication: Husband at bedside Status is: Inpatient Remains inpatient appropriate because: Ongoing management for pancytopenia   PT Follow up Recs:   Subjective:  Patient states she had quite a bit of bowel movement yesterday and does not want any more lactulose  today.  Overall doing little better  Examination:  General exam: Appears calm and comfortable   Respiratory system: Clear to auscultation. Respiratory effort normal. Cardiovascular system: S1 & S2 heard, RRR. No JVD, murmurs, rubs, gallops or clicks. No pedal edema. Gastrointestinal system: Abdomen is nondistended, soft and nontender. No organomegaly or masses felt. Normal bowel sounds heard. Central nervous system: Alert and oriented. No focal neurological deficits. Extremities: Symmetric 5 x 5 power. Skin: No rashes, lesions or ulcers Psychiatry: Judgement and insight appear normal. Mood & affect appropriate.                Diet Orders (From admission, onward)     Start     Ordered   03/26/24 1523  Diet regular Fluid consistency: Thin  Diet effective now       Question:  Fluid consistency:  Answer:  Thin   03/26/24 1522            Objective: Vitals:   03/26/24 1939 03/27/24 0012 03/27/24 0512 03/27/24 0636  BP: (!) 103/58 (!) 114/59 102/69   Pulse: 90 97 88   Resp: 16 17 14 12   Temp: 100 F (37.8 C) 98.5 F (36.9 C) 98.4 F (36.9 C)   TempSrc: Oral Oral Oral   SpO2: 97% 96% 97%   Weight:      Height:        Intake/Output Summary (Last 24 hours) at 03/27/2024 1023 Last data filed at 03/27/2024 0559 Gross per 24 hour  Intake 2951.13 ml  Output --  Net 2951.13 ml   Filed Weights   03/26/24 1237 03/26/24 1623  Weight: 52.2 kg 55.2 kg    Scheduled Meds:  sodium chloride    Intravenous Once   aluminum  sulfate-calcium  acetate  1 packet Topical TID   dicyclomine   10 mg Oral TID AC  docusate sodium   100 mg Oral BID   escitalopram   10 mg Oral Daily   filgrastim  (NIVESTYM ) SQ  300 mcg Subcutaneous Q1200   morphine   15 mg Oral q morning   morphine   30 mg Oral QHS   sodium chloride  flush  3 mL Intravenous Q12H   Continuous Infusions:  ceFEPime  (MAXIPIME ) IV Stopped (03/26/24 2211)    Nutritional status     Body mass index is 22.26 kg/m.  Data Reviewed:   CBC: Recent Labs  Lab 03/25/24 1416 03/26/24 1308 03/27/24 0539 03/27/24 0908   WBC 0.4* 0.3* 0.3* 0.4*  NEUTROABS 0.0* 0.0* 0.0* 0.1*  HGB 9.9* 10.5* 9.3* 8.4*  HCT 29.0* 30.6* 28.1* 25.1*  MCV 93.9 94.4 97.2 95.4  PLT 51* 24* 11* 12*   Basic Metabolic Panel: Recent Labs  Lab 03/25/24 1416 03/26/24 1308 03/27/24 0539  NA 134* 130* 130*  K 4.0 3.4* 3.5  CL 97* 95* 98  CO2 29 25 23   GLUCOSE 116* 119* 133*  BUN 10 8 7*  CREATININE 0.49 0.62 0.54  CALCIUM  9.0 8.5* 8.2*  MG  --   --  2.2   GFR: Estimated Creatinine Clearance: 52.5 mL/min (by C-G formula based on SCr of 0.54 mg/dL). Liver Function Tests: Recent Labs  Lab 03/25/24 1416 03/26/24 1308 03/27/24 0539  AST 58* 45* 28  ALT 97* 80* 54*  ALKPHOS 68 65 52  BILITOT 0.5 0.5 0.5  PROT 6.3* 6.6 5.6*  ALBUMIN 3.2* 3.6 2.7*   Recent Labs  Lab 03/25/24 1416  LIPASE 11   No results for input(s): AMMONIA in the last 168 hours. Coagulation Profile: Recent Labs  Lab 03/26/24 1308  INR 1.2   Cardiac Enzymes: No results for input(s): CKTOTAL, CKMB, CKMBINDEX, TROPONINI in the last 168 hours. BNP (last 3 results) No results for input(s): PROBNP in the last 8760 hours. HbA1C: No results for input(s): HGBA1C in the last 72 hours. CBG: No results for input(s): GLUCAP in the last 168 hours. Lipid Profile: No results for input(s): CHOL, HDL, LDLCALC, TRIG, CHOLHDL, LDLDIRECT in the last 72 hours. Thyroid Function Tests: No results for input(s): TSH, T4TOTAL, FREET4, T3FREE, THYROIDAB in the last 72 hours. Anemia Panel: No results for input(s): VITAMINB12, FOLATE, FERRITIN, TIBC, IRON, RETICCTPCT in the last 72 hours. Sepsis Labs: Recent Labs  Lab 03/26/24 1327 03/26/24 1855  LATICACIDVEN 2.1* 1.5    Recent Results (from the past 240 hours)  Urine culture     Status: Abnormal   Collection Time: 03/22/24 11:46 AM   Specimen: Urine, Clean Catch  Result Value Ref Range Status   Specimen Description   Final    URINE, CLEAN  CATCH Performed at [facility], [address] [place], [place] [address]    Special Requests   Final    NONE Performed at [facility], [address] [place], [place] [address]    Culture 70,000 COLONIES/mL PSEUDOMONAS AERUGINOSA (A)  Final   Report Status 03/24/2024 FINAL  Final   Organism ID, Bacteria PSEUDOMONAS AERUGINOSA (A)  Final      Susceptibility   Pseudomonas aeruginosa - MIC*    MEROPENEM <=0.25 SENSITIVE Sensitive     CIPROFLOXACIN  0.12 SENSITIVE Sensitive     IMIPENEM 2 SENSITIVE Sensitive     PIP/TAZO Value in next row Sensitive      <=4 SENSITIVEThis is a modified FDA-approved test that has been validated and its performance characteristics determined by the reporting laboratory.  This laboratory is certified under the Clinical Laboratory Improvement Amendments CLIA as qualified to perform high complexity clinical laboratory testing.    CEFEPIME  Value in next row Sensitive      <=4 SENSITIVEThis is a modified FDA-approved test that has been validated and its performance characteristics determined by the reporting laboratory.  This laboratory is certified under the Clinical Laboratory Improvement Amendments CLIA as qualified to perform high complexity clinical laboratory testing.    CEFTAZIDIME/AVIBACTAM Value in next row Sensitive      <=4 SENSITIVEThis is a modified FDA-approved test that has been validated and its performance characteristics determined by the reporting laboratory.  This laboratory is certified under the Clinical Laboratory Improvement Amendments CLIA as qualified to perform high complexity clinical laboratory testing.    CEFTOLOZANE/TAZOBACTAM Value in next row Sensitive      <=4 SENSITIVEThis is a modified FDA-approved test that has been validated and its performance characteristics determined by the reporting laboratory.  This laboratory is certified under the Clinical Laboratory Improvement Amendments  CLIA as qualified to perform high complexity clinical laboratory testing.    TOBRAMYCIN Value in next row Sensitive      <=4 SENSITIVEThis is a modified FDA-approved test that has been validated and its performance characteristics determined by the reporting laboratory.  This laboratory is certified under the Clinical Laboratory Improvement Amendments CLIA as qualified to perform high complexity clinical laboratory testing.    CEFTAZIDIME Value in next row Sensitive      <=4 SENSITIVEThis is a modified FDA-approved test that has been validated and its performance characteristics determined by the reporting laboratory.  This laboratory is certified under the Clinical Laboratory Improvement Amendments CLIA as qualified to perform high complexity clinical laboratory testing.    * 70,000 COLONIES/mL PSEUDOMONAS AERUGINOSA  Culture, blood (Routine x 2)     Status: None (Preliminary result)   Collection Time: 03/26/24  1:08 PM   Specimen: BLOOD  Result Value Ref Range Status   Specimen Description BLOOD LEFT ANTECUBITAL  Final   Special Requests   Final    BOTTLES DRAWN AEROBIC AND ANAEROBIC Blood Culture adequate volume   Culture   Final    NO GROWTH < 12 HOURS Performed at [facility], [address] [place], [place] [address]    Report Status PENDING  Incomplete  Culture, blood (Routine x 2)     Status: None (Preliminary result)   Collection Time: 03/26/24  1:12 PM   Specimen: BLOOD  Result Value Ref Range Status   Specimen Description BLOOD LEFT ANTECUBITAL  Final   Special Requests   Final    BOTTLES DRAWN AEROBIC AND ANAEROBIC Blood Culture adequate volume   Culture   Final    NO GROWTH < 12 HOURS Performed at [facility], [address] [place], [place] [address]    Report Status PENDING  Incomplete  Resp panel by RT-PCR (RSV, Flu A&B, Covid) Anterior Nasal Swab     Status: None   Collection Time: 03/26/24  2:09 PM   Specimen: Anterior Nasal Swab  Result Value Ref  Range Status   SARS Coronavirus 2 by RT PCR NEGATIVE NEGATIVE Final    Comment: (NOTE) SARS-CoV-2 target nucleic acids are NOT DETECTED.  The SARS-CoV-2 RNA is generally detectable in upper respiratory specimens during the acute phase of infection. The lowest concentration of SARS-CoV-2 viral copies this assay can detect is 138 copies/mL. A negative result does not preclude SARS-Cov-2 infection and should not be used  as the sole basis for treatment or other patient management decisions. A negative result may occur with  improper specimen collection/handling, submission of specimen other than nasopharyngeal swab, presence of viral mutation(s) within the areas targeted by this assay, and inadequate number of viral copies(<138 copies/mL). A negative result must be combined with clinical observations, patient history, and epidemiological information. The expected result is Negative.  Fact Sheet for Patients:  bloggercourse.com  Fact Sheet for Healthcare Providers:  seriousbroker.it  This test is no t yet approved or cleared by the [place]  FDA and  has been authorized for detection and/or diagnosis of SARS-CoV-2 by FDA under an Emergency Use Authorization (EUA). This EUA will remain  in effect (meaning this test can be used) for the duration of the COVID-19 declaration under Section 564(b)(1) of the Act, 21 U.S.C.section 360bbb-3(b)(1), unless the authorization is terminated  or revoked sooner.       Influenza A by PCR NEGATIVE NEGATIVE Final   Influenza B by PCR NEGATIVE NEGATIVE Final    Comment: (NOTE) The Xpert Xpress SARS-CoV-2/FLU/RSV plus assay is intended as an aid in the diagnosis of influenza from Nasopharyngeal swab specimens and should not be used as a sole basis for treatment. Nasal washings and aspirates are unacceptable for Xpert Xpress SARS-CoV-2/FLU/RSV testing.  Fact Sheet for  Patients: bloggercourse.com  Fact Sheet for Healthcare Providers: seriousbroker.it  This test is not yet approved or cleared by the [place]  FDA and has been authorized for detection and/or diagnosis of SARS-CoV-2 by FDA under an Emergency Use Authorization (EUA). This EUA will remain in effect (meaning this test can be used) for the duration of the COVID-19 declaration under Section 564(b)(1) of the Act, 21 U.S.C. section 360bbb-3(b)(1), unless the authorization is terminated or revoked.     Resp Syncytial Virus by PCR NEGATIVE NEGATIVE Final    Comment: (NOTE) Fact Sheet for Patients: bloggercourse.com  Fact Sheet for Healthcare Providers: seriousbroker.it  This test is not yet approved or cleared by the [place]  FDA and has been authorized for detection and/or diagnosis of SARS-CoV-2 by FDA under an Emergency Use Authorization (EUA). This EUA will remain in effect (meaning this test can be used) for the duration of the COVID-19 declaration under Section 564(b)(1) of the Act, 21 U.S.C. section 360bbb-3(b)(1), unless the authorization is terminated or revoked.  Performed at [facility], [address], [place], [place] [address]          Radiology Studies: DG Chest 2 View if patient is not in a treatment room. Result Date: 03/26/2024 EXAM: 2 VIEW(S) XRAY OF THE CHEST 03/26/2024 01:52:50 PM COMPARISON: Chest x-ray dated 11/18/2017. CLINICAL HISTORY: Suspected Sepsis. FINDINGS: LUNGS AND PLEURA: No focal pulmonary opacity. No pleural effusion. No pneumothorax. HEART AND MEDIASTINUM: No acute abnormality of the cardiac and mediastinal silhouettes. BONES AND SOFT TISSUES: No acute osseous abnormality. IMPRESSION: 1. No acute cardiopulmonary process. Electronically signed by: Morgane Naveau MD 03/26/2024 01:56 PM EST RP Workstation: [id number]   CT  ABDOMEN PELVIS W CONTRAST Result Date: 03/25/2024 CLINICAL DATA:  Abdominal pain. Constipation. History of he female cancer. EXAM: CT ABDOMEN AND PELVIS WITH CONTRAST TECHNIQUE: Multidetector CT imaging of the abdomen and pelvis was performed using the standard protocol following bolus administration of intravenous contrast. RADIATION DOSE REDUCTION: This exam was performed according to the departmental dose-optimization program which includes automated exposure control, adjustment of the mA and/or kV according to patient size and/or use of iterative reconstruction technique. CONTRAST:  90mL OMNIPAQUE  IOHEXOL   300 MG/ML  SOLN COMPARISON:  CT abdomen pelvis dated 01/19/2024. FINDINGS: Lower chest: The visualized lung bases are clear. No intra-abdominal free air or free fluid. Hepatobiliary: The liver is unremarkable. No biliary dilatation. The gallbladder is unremarkable Pancreas: Unremarkable. No pancreatic ductal dilatation or surrounding inflammatory changes. Spleen: Normal in size without focal abnormality. Adrenals/Urinary Tract: The adrenal glands, kidneys, visualized ureters, and urinary bladder appear unremarkable. Stomach/Bowel: Evaluation of the bowel is limited in the absence of oral contrast. There is no bowel obstruction or active inflammation. There is moderate stool throughout the colon. Appendectomy. Vascular/Lymphatic: The abdominal aorta and IVC are unremarkable. No portal venous gas. No adenopathy. Reproductive: Calcified uterine fibroid. No suspicious adnexal masses. Other: None Musculoskeletal: No acute or significant osseous findings. IMPRESSION: 1. No acute intra-abdominal or pelvic pathology. 2. Moderate colonic stool burden. No bowel obstruction. Electronically Signed   By: Vanetta Chou M.D.   On: 03/25/2024 16:22           LOS: 1 day   Time spent= 35 mins    Burgess JAYSON Dare, MD [facility]  If 7PM-7AM, please contact night-coverage  03/27/2024, 10:23 AM

## 2024-03-27 NOTE — Progress Notes (Signed)
 Initial Nutrition Assessment  DOCUMENTATION CODES:   Non-severe (moderate) malnutrition in context of acute illness/injury  INTERVENTION:   -[employer] Breakfast Essentials TID with meals -Family to bring in [place] milk to mix with -Together provides ~220 kcals and 18g protein  -Pt to snack on yogurt, mixing jelly in to sweeten  NUTRITION DIAGNOSIS:   Moderate Malnutrition related to cancer and cancer related treatments, acute illness as evidenced by mild fat depletion, mild muscle depletion, percent weight loss.  GOAL:   Patient will meet greater than or equal to 90% of their needs  MONITOR:   PO intake, Supplement acceptance  REASON FOR ASSESSMENT:   Consult Assessment of nutrition requirement/status  ASSESSMENT:   68 year old with history of rheumatoid arthritis, anal cancer on chemoradiation, chronic pain, recent UTI due to Pseudomonas, chemo-induced neutropenia treated with Neupogen  day prior to admission presenting with fever.  Patient in bathroom at time of visit, husband at bedside. Pt reports having a rough night last night d/t frequent BMs, was on lactulose  but now off this medication.  Pt has now completed chemo/radiation, last treatment 11/21.  Last seen by [facility] 11/14.  Pt states she has struggled to eat d/t constipation, pain and appetite being poor. Was also restricted with when she could eat d/t being on ciprofloxacin . Was only able to drink protein twice a day. Really likes [employer] milk mixed with carnation breakfast packets. Will continue this here and send packets on meal trays. Husband to bring in [place] milk. Pt also snacking on greek yogurt mixed with seedless strawberry jelly.  Pt also having mouth pain given mouth sores. BMs and urination are painful given cancer.  Pt reports UBW ~132 lbs. Per weight records, pt has lost 11 lbs since 9/16 (8% wt loss x 2 months, significant for time frame).  Medications: Colace  Labs reviewed: Low  sodium   NUTRITION - FOCUSED PHYSICAL EXAM:  Flowsheet Row Most Recent Value  Orbital Region Mild depletion  Upper Arm Region Mild depletion  Thoracic and Lumbar Region No depletion  Buccal Region Moderate depletion  Temple Region Moderate depletion  Clavicle Bone Region Mild depletion  Clavicle and Acromion Bone Region Mild depletion  Scapular Bone Region Mild depletion  Dorsal Hand Mild depletion  Patellar Region No depletion  Anterior Thigh Region No depletion  Posterior Calf Region No depletion  Edema (RD Assessment) None  Hair Reviewed  Eyes Reviewed  Mouth Reviewed  [mouth sores]  Skin Reviewed  Nails Reviewed    Diet Order:   Diet Order             Diet regular Fluid consistency: Thin  Diet effective now                   EDUCATION NEEDS:   Education needs have been addressed  Skin:  Skin Assessment: Reviewed RN Assessment  Last BM:  11/24 -type 6  Height:   Ht Readings from Last 1 Encounters:  03/26/24 5' 2 (1.575 m)    Weight:   Wt Readings from Last 1 Encounters:  03/26/24 55.2 kg    BMI:  Body mass index is 22.26 kg/m.  Estimated Nutritional Needs:   Kcal:  1650-1850  Protein:  80-100g  Fluid:  1.8L/day   Morna Lee, MS, RD, LDN Inpatient Clinical Dietitian Contact via Secure chat

## 2024-03-28 ENCOUNTER — Inpatient Hospital Stay

## 2024-03-28 ENCOUNTER — Other Ambulatory Visit: Payer: Self-pay

## 2024-03-28 DIAGNOSIS — D696 Thrombocytopenia, unspecified: Secondary | ICD-10-CM | POA: Diagnosis not present

## 2024-03-28 DIAGNOSIS — E440 Moderate protein-calorie malnutrition: Secondary | ICD-10-CM | POA: Insufficient documentation

## 2024-03-28 DIAGNOSIS — C218 Malignant neoplasm of overlapping sites of rectum, anus and anal canal: Secondary | ICD-10-CM | POA: Diagnosis not present

## 2024-03-28 DIAGNOSIS — D709 Neutropenia, unspecified: Secondary | ICD-10-CM | POA: Diagnosis not present

## 2024-03-28 DIAGNOSIS — D701 Agranulocytosis secondary to cancer chemotherapy: Secondary | ICD-10-CM

## 2024-03-28 DIAGNOSIS — R5081 Fever presenting with conditions classified elsewhere: Secondary | ICD-10-CM | POA: Diagnosis not present

## 2024-03-28 LAB — BASIC METABOLIC PANEL WITH GFR
Anion gap: 8 (ref 5–15)
BUN: 10 mg/dL (ref 8–23)
CO2: 26 mmol/L (ref 22–32)
Calcium: 7.9 mg/dL — ABNORMAL LOW (ref 8.9–10.3)
Chloride: 97 mmol/L — ABNORMAL LOW (ref 98–111)
Creatinine, Ser: 0.53 mg/dL (ref 0.44–1.00)
GFR, Estimated: >60 mL/min (ref >60)
Glucose, Bld: 109 mg/dL — ABNORMAL HIGH (ref 70–99)
Potassium: 3.6 mmol/L (ref 3.5–5.1)
Sodium: 131 mmol/L — ABNORMAL LOW (ref 135–145)

## 2024-03-28 LAB — CBC WITH DIFFERENTIAL/PLATELET
Abs Immature Granulocytes: 0.05 K/uL (ref 0.00–0.07)
Basophils Absolute: 0 K/uL (ref 0.0–0.1)
Basophils Relative: 3 %
Eosinophils Absolute: 0.2 K/uL (ref 0.0–0.5)
Eosinophils Relative: 22 %
HCT: 21.2 % — ABNORMAL LOW (ref 36.0–46.0)
Hemoglobin: 7.6 g/dL — ABNORMAL LOW (ref 12.0–15.0)
Immature Granulocytes: 7 %
Lymphocytes Relative: 34 %
Lymphs Abs: 0.3 K/uL — ABNORMAL LOW (ref 0.7–4.0)
MCH: 32.5 pg (ref 26.0–34.0)
MCHC: 35.8 g/dL (ref 30.0–36.0)
MCV: 90.6 fL (ref 80.0–100.0)
Monocytes Absolute: 0.1 K/uL (ref 0.1–1.0)
Monocytes Relative: 15 %
Neutro Abs: 0.1 K/uL — CL (ref 1.7–7.7)
Neutrophils Relative %: 19 %
Platelets: 17 K/uL — CL (ref 150–400)
RBC: 2.34 MIL/uL — ABNORMAL LOW (ref 3.87–5.11)
RDW: 12.8 % (ref 11.5–15.5)
Smear Review: NORMAL
WBC: 0.7 K/uL — CL (ref 4.0–10.5)
nRBC: 0 % (ref 0.0–0.2)

## 2024-03-28 LAB — BPAM PLATELET PHERESIS
Blood Product Expiration Date: 202511282359
ISSUE DATE / TIME: 202511241314
Unit Type and Rh: 6200

## 2024-03-28 LAB — PREPARE PLATELET PHERESIS: Unit division: 0

## 2024-03-28 LAB — PATHOLOGIST SMEAR REVIEW: Path Review: NEGATIVE

## 2024-03-28 LAB — MAGNESIUM: Magnesium: 1.9 mg/dL (ref 1.7–2.4)

## 2024-03-28 MED ORDER — ENSURE PLUS HIGH PROTEIN PO LIQD: 237.0000 mL | Freq: Two times a day (BID) | ORAL | Status: DC

## 2024-03-28 MED ORDER — SODIUM CHLORIDE 0.9% IV SOLUTION: Freq: Once | INTRAVENOUS | Status: AC

## 2024-03-28 MED ORDER — SILVER SULFADIAZINE 1 % EX CREA: TOPICAL_CREAM | Freq: Three times a day (TID) | CUTANEOUS | Status: DC | PRN

## 2024-03-28 MED ORDER — POTASSIUM CHLORIDE CRYS ER 20 MEQ PO TBCR
40.0000 meq | EXTENDED_RELEASE_TABLET | Freq: Once | ORAL | Status: AC
  Administered 2024-03-28: 40 meq via ORAL
  Filled 2024-03-28: qty 2

## 2024-03-28 NOTE — Plan of Care (Signed)
  Problem: Respiratory: Goal: Ability to maintain adequate ventilation will improve Outcome: Progressing   Problem: Education: Goal: Knowledge of General Education information will improve Description: Including pain rating scale, medication(s)/side effects and non-pharmacologic comfort measures Outcome: Progressing   Problem: Activity: Goal: Risk for activity intolerance will decrease Outcome: Progressing   Problem: Coping: Goal: Level of anxiety will decrease Outcome: Progressing   Problem: Pain Managment: Goal: General experience of comfort will improve and/or be controlled Outcome: Progressing   Problem: Skin Integrity: Goal: Risk for impaired skin integrity will decrease Outcome: Progressing

## 2024-03-28 NOTE — Plan of Care (Signed)
   Problem: Fluid Volume: Goal: Hemodynamic stability will improve Outcome: Progressing   Problem: Clinical Measurements: Goal: Diagnostic test results will improve Outcome: Progressing Goal: Signs and symptoms of infection will decrease Outcome: Progressing   Problem: Respiratory: Goal: Ability to maintain adequate ventilation will improve Outcome: Progressing

## 2024-03-28 NOTE — Progress Notes (Addendum)
 PROGRESS NOTE    Katrina [name]  FMW:[id number] DOB: Oct 28, 1955 DOA: 03/26/2024 PCP: [name], MD    Brief Narrative:   68 year old with history of rheumatoid arthritis, anal cancer on chemoradiation, chronic pain, recent UTI due to Pseudomonas, chemo-induced neutropenia treated with Neupogen  day prior to admission presenting with fever.  Patient was recently started on Bactrim  for UTI which grew pansensitive Pseudomonas thereafter switched to ciprofloxacin  on 11/20.  Assessment & Plan:   Neutropenic fever Pancytopenia Recent urinary tract infection, pansensitive Pseudomonas -Follow culture data.  Patient was started on ciprofloxacin  on 11/20.  Now started on cefepime . CXR and CT AP showed mod stool burden. 1U Plt transfused, GSF ordered. Onc Recommends to keep ptn until Thursday but can go after her GSF that day if she remaisn stable.  - Continue to monitor fever curve, labs and cultures -Completed radiation treatment 03/24/2024   Anal cancer Initially diagnosed in September 2025 which was metastatic to local lymph nodes.  Underwent chemotherapy followed by radiation.  Oncology team to see this patient.   Hyponatremia, hypovolemia -IV fluids   Moderate constipation - Improved now continue daily Colace   Chronic pain  - Continue long-acting morphine  and as-needed oxycodone      Rheumatoid arthritis Currently Humira and methotrexate were on hold as patient was on chemotherapy.  Plans to follow back up with outpatient rheumatology to discuss resuming appropriate treatment.     DVT prophylaxis: SCDs  Code Status: Full  Family Communication: Husband at bedside Status is: Inpatient Remains inpatient appropriate because: Ongoing management for pancytopenia.  Hopefully home in next 24-48 hours   PT Follow up Recs:   Subjective:  Feels better than yesterday.  She was able to ambulate.  Remains afebrile  Examination:  General exam: Appears calm and comfortable   Respiratory system: Clear to auscultation. Respiratory effort normal. Cardiovascular system: S1 & S2 heard, RRR. No JVD, murmurs, rubs, gallops or clicks. No pedal edema. Gastrointestinal system: Abdomen is nondistended, soft and nontender. No organomegaly or masses felt. Normal bowel sounds heard. Central nervous system: Alert and oriented. No focal neurological deficits. Extremities: Symmetric 5 x 5 power. Skin: No rashes, lesions or ulcers Psychiatry: Judgement and insight appear normal. Mood & affect appropriate.                Diet Orders (From admission, onward)     Start     Ordered   03/26/24 1523  Diet regular Fluid consistency: Thin  Diet effective now       Question:  Fluid consistency:  Answer:  Thin   03/26/24 1522            Objective: Vitals:   03/27/24 1350 03/27/24 1634 03/27/24 2201 03/28/24 0634  BP: 94/67 108/65 93/61 (!) 104/58  Pulse: 85 95 88 85  Resp: 14 15 18 18   Temp: 99.2 F (37.3 C) 99.2 F (37.3 C) 98.5 F (36.9 C) 98.4 F (36.9 C)  TempSrc: Oral Oral    SpO2: 97% 97% 94% 95%  Weight:      Height:        Intake/Output Summary (Last 24 hours) at 03/28/2024 1007 Last data filed at 03/28/2024 9147 Gross per 24 hour  Intake 752.85 ml  Output --  Net 752.85 ml   Filed Weights   03/26/24 1237 03/26/24 1623  Weight: 52.2 kg 55.2 kg    Scheduled Meds:  aluminum  sulfate-calcium  acetate  1 packet Topical TID   dicyclomine   10 mg Oral TID AC  docusate sodium   100 mg Oral BID   escitalopram   10 mg Oral Daily   feeding supplement  237 mL Oral BID BM   filgrastim  (NIVESTYM ) SQ  300 mcg Subcutaneous Q1200   morphine   15 mg Oral q morning   morphine   30 mg Oral QHS   potassium chloride   40 mEq Oral Once   sodium chloride  flush  3 mL Intravenous Q12H   Continuous Infusions:  ceFEPime  (MAXIPIME ) IV Stopped (03/27/24 2213)    Nutritional status Signs/Symptoms: mild fat depletion, mild muscle depletion, percent weight  loss Interventions: Refer to RD note for recommendations, Carnation Instant Breakfast Body mass index is 22.26 kg/m.  Data Reviewed:   CBC: Recent Labs  Lab 03/25/24 1416 03/26/24 1308 03/27/24 0539 03/27/24 0908 03/28/24 0555  WBC 0.4* 0.3* 0.3* 0.4* 0.7*  NEUTROABS 0.0* 0.0* 0.0* 0.1* 0.1*  HGB 9.9* 10.5* 9.3* 8.4* 7.6*  HCT 29.0* 30.6* 28.1* 25.1* 21.2*  MCV 93.9 94.4 97.2 95.4 90.6  PLT 51* 24* 11* 12* 17*   Basic Metabolic Panel: Recent Labs  Lab 03/25/24 1416 03/26/24 1308 03/27/24 0539 03/28/24 0555  NA 134* 130* 130* 131*  K 4.0 3.4* 3.5 3.6  CL 97* 95* 98 97*  CO2 29 25 23 26   GLUCOSE 116* 119* 133* 109*  BUN 10 8 7* 10  CREATININE 0.49 0.62 0.54 0.53  CALCIUM  9.0 8.5* 8.2* 7.9*  MG  --   --  2.2 1.9   GFR: Estimated Creatinine Clearance: 52.5 mL/min (by C-G formula based on SCr of 0.53 mg/dL). Liver Function Tests: Recent Labs  Lab 03/25/24 1416 03/26/24 1308 03/27/24 0539  AST 58* 45* 28  ALT 97* 80* 54*  ALKPHOS 68 65 52  BILITOT 0.5 0.5 0.5  PROT 6.3* 6.6 5.6*  ALBUMIN 3.2* 3.6 2.7*   Recent Labs  Lab 03/25/24 1416  LIPASE 11   No results for input(s): AMMONIA in the last 168 hours. Coagulation Profile: Recent Labs  Lab 03/26/24 1308  INR 1.2   Cardiac Enzymes: No results for input(s): CKTOTAL, CKMB, CKMBINDEX, TROPONINI in the last 168 hours. BNP (last 3 results) No results for input(s): PROBNP in the last 8760 hours. HbA1C: No results for input(s): HGBA1C in the last 72 hours. CBG: No results for input(s): GLUCAP in the last 168 hours. Lipid Profile: No results for input(s): CHOL, HDL, LDLCALC, TRIG, CHOLHDL, LDLDIRECT in the last 72 hours. Thyroid Function Tests: No results for input(s): TSH, T4TOTAL, FREET4, T3FREE, THYROIDAB in the last 72 hours. Anemia Panel: No results for input(s): VITAMINB12, FOLATE, FERRITIN, TIBC, IRON, RETICCTPCT in the last 72 hours. Sepsis  Labs: Recent Labs  Lab 03/26/24 1327 03/26/24 1855  LATICACIDVEN 2.1* 1.5    Recent Results (from the past 240 hours)  Urine culture     Status: Abnormal   Collection Time: 03/22/24 11:46 AM   Specimen: Urine, Clean Catch  Result Value Ref Range Status   Specimen Description   Final    URINE, CLEAN CATCH Performed at [facility], [address], [place], [place] [address]    Special Requests   Final    NONE Performed at [facility], [address] [place], [place] [address]    Culture 70,000 COLONIES/mL PSEUDOMONAS AERUGINOSA (A)  Final   Report Status 03/24/2024 FINAL  Final   Organism ID, Bacteria PSEUDOMONAS AERUGINOSA (A)  Final      Susceptibility   Pseudomonas aeruginosa - MIC*    MEROPENEM <=  0.25 SENSITIVE Sensitive     CIPROFLOXACIN  0.12 SENSITIVE Sensitive     IMIPENEM 2 SENSITIVE Sensitive     PIP/TAZO Value in next row Sensitive      <=4 SENSITIVEThis is a modified FDA-approved test that has been validated and its performance characteristics determined by the reporting laboratory.  This laboratory is certified under the Clinical Laboratory Improvement Amendments CLIA as qualified to perform high complexity clinical laboratory testing.    CEFEPIME  Value in next row Sensitive      <=4 SENSITIVEThis is a modified FDA-approved test that has been validated and its performance characteristics determined by the reporting laboratory.  This laboratory is certified under the Clinical Laboratory Improvement Amendments CLIA as qualified to perform high complexity clinical laboratory testing.    CEFTAZIDIME/AVIBACTAM Value in next row Sensitive      <=4 SENSITIVEThis is a modified FDA-approved test that has been validated and its performance characteristics determined by the reporting laboratory.  This laboratory is certified under the Clinical Laboratory Improvement Amendments CLIA as qualified to perform high complexity clinical  laboratory testing.    CEFTOLOZANE/TAZOBACTAM Value in next row Sensitive      <=4 SENSITIVEThis is a modified FDA-approved test that has been validated and its performance characteristics determined by the reporting laboratory.  This laboratory is certified under the Clinical Laboratory Improvement Amendments CLIA as qualified to perform high complexity clinical laboratory testing.    TOBRAMYCIN Value in next row Sensitive      <=4 SENSITIVEThis is a modified FDA-approved test that has been validated and its performance characteristics determined by the reporting laboratory.  This laboratory is certified under the Clinical Laboratory Improvement Amendments CLIA as qualified to perform high complexity clinical laboratory testing.    CEFTAZIDIME Value in next row Sensitive      <=4 SENSITIVEThis is a modified FDA-approved test that has been validated and its performance characteristics determined by the reporting laboratory.  This laboratory is certified under the Clinical Laboratory Improvement Amendments CLIA as qualified to perform high complexity clinical laboratory testing.    * 70,000 COLONIES/mL PSEUDOMONAS AERUGINOSA  Culture, blood (Routine x 2)     Status: None (Preliminary result)   Collection Time: 03/26/24  1:08 PM   Specimen: BLOOD  Result Value Ref Range Status   Specimen Description BLOOD LEFT ANTECUBITAL  Final   Special Requests   Final    BOTTLES DRAWN AEROBIC AND ANAEROBIC Blood Culture adequate volume   Culture   Final    NO GROWTH 2 DAYS Performed at [facility], [address] [place], [place] [address]    Report Status PENDING  Incomplete  Urine Culture     Status: None   Collection Time: 03/26/24  1:08 PM   Specimen: Urine, Random  Result Value Ref Range Status   Specimen Description   Final    URINE, RANDOM Performed at [facility], [address] [place], [place] [address]    Special Requests   Final    NONE Reflexed from  [id number] Performed at [facility], [address] [place], [place] [address]    Culture   Final    NO GROWTH Performed at [facility], [address] [place], [place] [address]    Report Status 03/27/2024 FINAL  Final  Culture, blood (Routine x 2)     Status: None (Preliminary result)   Collection Time: 03/26/24  1:12 PM   Specimen: BLOOD  Result Value Ref Range  Status   Specimen Description BLOOD LEFT ANTECUBITAL  Final   Special Requests   Final    BOTTLES DRAWN AEROBIC AND ANAEROBIC Blood Culture adequate volume   Culture   Final    NO GROWTH 2 DAYS Performed at [facility], [address] [place], [place] [address]    Report Status PENDING  Incomplete  Resp panel by RT-PCR (RSV, Flu A&B, Covid) Anterior Nasal Swab     Status: None   Collection Time: 03/26/24  2:09 PM   Specimen: Anterior Nasal Swab  Result Value Ref Range Status   SARS Coronavirus 2 by RT PCR NEGATIVE NEGATIVE Final    Comment: (NOTE) SARS-CoV-2 target nucleic acids are NOT DETECTED.  The SARS-CoV-2 RNA is generally detectable in upper respiratory specimens during the acute phase of infection. The lowest concentration of SARS-CoV-2 viral copies this assay can detect is 138 copies/mL. A negative result does not preclude SARS-Cov-2 infection and should not be used as the sole basis for treatment or other patient management decisions. A negative result may occur with  improper specimen collection/handling, submission of specimen other than nasopharyngeal swab, presence of viral mutation(s) within the areas targeted by this assay, and inadequate number of viral copies(<138 copies/mL). A negative result must be combined with clinical observations, patient history, and epidemiological information. The expected result is Negative.  Fact Sheet for Patients:  bloggercourse.com  Fact Sheet for Healthcare Providers:   seriousbroker.it  This test is no t yet approved or cleared by the [place]  FDA and  has been authorized for detection and/or diagnosis of SARS-CoV-2 by FDA under an Emergency Use Authorization (EUA). This EUA will remain  in effect (meaning this test can be used) for the duration of the COVID-19 declaration under Section 564(b)(1) of the Act, 21 U.S.C.section 360bbb-3(b)(1), unless the authorization is terminated  or revoked sooner.       Influenza A by PCR NEGATIVE NEGATIVE Final   Influenza B by PCR NEGATIVE NEGATIVE Final    Comment: (NOTE) The Xpert Xpress SARS-CoV-2/FLU/RSV plus assay is intended as an aid in the diagnosis of influenza from Nasopharyngeal swab specimens and should not be used as a sole basis for treatment. Nasal washings and aspirates are unacceptable for Xpert Xpress SARS-CoV-2/FLU/RSV testing.  Fact Sheet for Patients: bloggercourse.com  Fact Sheet for Healthcare Providers: seriousbroker.it  This test is not yet approved or cleared by the [place]  FDA and has been authorized for detection and/or diagnosis of SARS-CoV-2 by FDA under an Emergency Use Authorization (EUA). This EUA will remain in effect (meaning this test can be used) for the duration of the COVID-19 declaration under Section 564(b)(1) of the Act, 21 U.S.C. section 360bbb-3(b)(1), unless the authorization is terminated or revoked.     Resp Syncytial Virus by PCR NEGATIVE NEGATIVE Final    Comment: (NOTE) Fact Sheet for Patients: bloggercourse.com  Fact Sheet for Healthcare Providers: seriousbroker.it  This test is not yet approved or cleared by the [place]  FDA and has been authorized for detection and/or diagnosis of SARS-CoV-2 by FDA under an Emergency Use Authorization (EUA). This EUA will remain in effect (meaning this test can be used) for  the duration of the COVID-19 declaration under Section 564(b)(1) of the Act, 21 U.S.C. section 360bbb-3(b)(1), unless the authorization is terminated or revoked.  Performed at [facility], [address] [place], [place] [address]          Radiology Studies: DG Chest 2 View if patient  is not in a treatment room. Result Date: 03/26/2024 EXAM: 2 VIEW(S) XRAY OF THE CHEST 03/26/2024 01:52:50 PM COMPARISON: Chest x-ray dated 11/18/2017. CLINICAL HISTORY: Suspected Sepsis. FINDINGS: LUNGS AND PLEURA: No focal pulmonary opacity. No pleural effusion. No pneumothorax. HEART AND MEDIASTINUM: No acute abnormality of the cardiac and mediastinal silhouettes. BONES AND SOFT TISSUES: No acute osseous abnormality. IMPRESSION: 1. No acute cardiopulmonary process. Electronically signed by: Morgane Naveau MD 03/26/2024 01:56 PM EST RP Workstation: [id number]           LOS: 2 days   Time spent= 35 mins    Burgess JAYSON Dare, MD [facility]  If 7PM-7AM, please contact night-coverage  03/28/2024, 10:07 AM

## 2024-03-28 NOTE — Progress Notes (Signed)
 Katrina [name]   DOB:1956/02/18   FM#:[id number]      ASSESSMENT & PLAN:  Katrina [name] is a 68 year old female patient admitted on 03/26/2024 from home with complaints of fever.  Oncologic history is significant for anal cancer.  Medical oncology/Dr. Pasam following closely.     Pancytopenia: Neutropenic fever - WBC with slight improvement 0.7 with very low ANC 0.1 - Continue G-CSF 300 mcg subcu daily until ANC >1500.   - Continue IV antibiotics as ordered - Monitor fever curve Anemia - Hemoglobin decreased 7.6 - Recommend PRBC transfusion for hemoglobin <7.0 Thrombocytopenia - Platelets with slight improvement to 17K.  Status post one unit platelets given yesterday.  - Recommend platelet transfusion for counts <20 K.  Ordered 1 unit platelets to be transfused today. - Continue to monitor CBC with differential daily -- Due to persistent pancytopenia, patient may need additional days of G-CSF and supportive care.  Tentatively plan to keep her until morning of Thanksgiving and if no further issues, may be discharged after receiving G-CSF.  Blood levels expected to slowly improve in the next few days.  -- Will repeat labs in outpatient oncology in 2 weeks.  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 - PET scan 02/02/2024 showed no evidence of distant mets. -On concurrent chemo/radiation with 5-FU + mitomycin Shiela protocol.  Cycle 1 initiated 02/14/2024, completed C2 on 03/17/24 and completed RT on 03/24/24. - Medical oncology/Dr. Pasam following.   Pain, rectal/anal area - Likely radiation-induced - Continue pain meds as ordered - Continue supportive care for symptom relief  Recent UTI -- positive pseudomonas -- Given cipro  and cefepime , continue antibiotics as ordered   History of RA -- Meds on hold at this time. Will reevaluate as outpatient.     Code Status Full   Subjective:   Patient seen awake and alert sitting up in bed. Reports that she is beginning to feel much better. Aware of platelet transfusion planned for today.  No further complaints offered.  Objective:   Intake/Output Summary (Last 24 hours) at 03/28/2024 1016 Last data filed at 03/28/2024 9147 Gross per 24 hour  Intake 752.85 ml  Output --  Net 752.85 ml     PHYSICAL EXAMINATION: ECOG PERFORMANCE STATUS: 2 - Symptomatic, <50% confined to bed  Vitals:   03/27/24 2201 03/28/24 0634  BP: 93/61 (!) 104/58  Pulse: 88 85  Resp: 18 18  Temp: 98.5 F (36.9 C) 98.4 F (36.9 C)  SpO2: 94% 95%   Filed Weights   03/26/24 1237 03/26/24 1623  Weight: 115 lb (52.2 kg) 121 lb 11.1 oz (55.2 kg)    GENERAL: alert, no distress and comfortable SKIN: +pale skin color, texture, turgor are normal, no rashes or significant lesions EYES: normal, conjunctiva are pink and non-injected, sclera clear OROPHARYNX: no exudate, no erythema and lips, buccal mucosa, and tongue normal  NECK: supple, thyroid normal size, non-tender, without nodularity LYMPH: no palpable lymphadenopathy in the cervical, axillary or inguinal LUNGS: clear to auscultation and percussion with normal breathing effort HEART: regular rate & rhythm and no murmurs and no lower extremity edema ABDOMEN: abdomen soft, non-tender and normal bowel sounds MUSCULOSKELETAL: no cyanosis of digits and no clubbing  PSYCH: alert & oriented x 3 with fluent speech NEURO: no focal motor/sensory deficits   All questions were answered. The patient knows to call the clinic with any problems, questions or concerns.   The total time spent  in the appointment was 40 minutes encounter with patient including review of chart and various tests results, discussions about plan of care and coordination of care plan  Olam JINNY Brunner, NP 03/28/2024 10:16 AM    Labs Reviewed:  Lab Results  Component Value Date   WBC 0.7 (LL) 03/28/2024   HGB 7.6 (L) 03/28/2024    HCT 21.2 (L) 03/28/2024   MCV 90.6 03/28/2024   PLT 17 (LL) 03/28/2024   Recent Labs    03/25/24 1416 03/26/24 1308 03/27/24 0539 03/28/24 0555  NA 134* 130* 130* 131*  K 4.0 3.4* 3.5 3.6  CL 97* 95* 98 97*  CO2 29 25 23 26   GLUCOSE 116* 119* 133* 109*  BUN 10 8 7* 10  CREATININE 0.49 0.62 0.54 0.53  CALCIUM  9.0 8.5* 8.2* 7.9*  GFRNONAA >60 >60 >60 >60  PROT 6.3* 6.6 5.6*  --   ALBUMIN 3.2* 3.6 2.7*  --   AST 58* 45* 28  --   ALT 97* 80* 54*  --   ALKPHOS 68 65 52  --   BILITOT 0.5 0.5 0.5  --   BILIDIR  --   --  0.3*  --   IBILI  --   --  0.2*  --     Studies Reviewed:  DG Chest 2 View if patient is not in a treatment room. Result Date: 03/26/2024 EXAM: 2 VIEW(S) XRAY OF THE CHEST 03/26/2024 01:52:50 PM COMPARISON: Chest x-ray dated 11/18/2017. CLINICAL HISTORY: Suspected Sepsis. FINDINGS: LUNGS AND PLEURA: No focal pulmonary opacity. No pleural effusion. No pneumothorax. HEART AND MEDIASTINUM: No acute abnormality of the cardiac and mediastinal silhouettes. BONES AND SOFT TISSUES: No acute osseous abnormality. IMPRESSION: 1. No acute cardiopulmonary process. Electronically signed by: Morgane Naveau MD 03/26/2024 01:56 PM EST RP Workstation: [id number]   CT ABDOMEN PELVIS W CONTRAST Result Date: 03/25/2024 CLINICAL DATA:  Abdominal pain. Constipation. History of he female cancer. EXAM: CT ABDOMEN AND PELVIS WITH CONTRAST TECHNIQUE: Multidetector CT imaging of the abdomen and pelvis was performed using the standard protocol following bolus administration of intravenous contrast. RADIATION DOSE REDUCTION: This exam was performed according to the departmental dose-optimization program which includes automated exposure control, adjustment of the mA and/or kV according to patient size and/or use of iterative reconstruction technique. CONTRAST:  90mL OMNIPAQUE  IOHEXOL  300 MG/ML  SOLN COMPARISON:  CT abdomen pelvis dated 01/19/2024. FINDINGS: Lower chest: The visualized lung bases are  clear. No intra-abdominal free air or free fluid. Hepatobiliary: The liver is unremarkable. No biliary dilatation. The gallbladder is unremarkable Pancreas: Unremarkable. No pancreatic ductal dilatation or surrounding inflammatory changes. Spleen: Normal in size without focal abnormality. Adrenals/Urinary Tract: The adrenal glands, kidneys, visualized ureters, and urinary bladder appear unremarkable. Stomach/Bowel: Evaluation of the bowel is limited in the absence of oral contrast. There is no bowel obstruction or active inflammation. There is moderate stool throughout the colon. Appendectomy. Vascular/Lymphatic: The abdominal aorta and IVC are unremarkable. No portal venous gas. No adenopathy. Reproductive: Calcified uterine fibroid. No suspicious adnexal masses. Other: None Musculoskeletal: No acute or significant osseous findings. IMPRESSION: 1. No acute intra-abdominal or pelvic pathology. 2. Moderate colonic stool burden. No bowel obstruction. Electronically Signed   By: Vanetta Chou M.D.   On: 03/25/2024 16:22   IR PICC PLACEMENT RIGHT >5 YRS INC IMG GUIDE Result Date: 03/10/2024 INDICATION: Patient with history of anal cancer; central venous access requested for chemotherapy EXAM: RIGHT UPPER EXTREMITY PICC LINE PLACEMENT WITH ULTRASOUND AND FLUOROSCOPIC GUIDANCE MEDICATIONS: 3 mL  1% lidocaine  with epinephrine  to skin/subcutaneous tissue ANESTHESIA/SEDATION: None FLUOROSCOPY: Radiation Exposure Index (as provided by the fluoroscopic device): 1 mGy Kerma COMPLICATIONS: None immediate. PROCEDURE: The patient was advised of the possible risks and complications and agreed to undergo the procedure. The patient was then brought to the angiographic suite for the procedure. The right arm was prepped with chlorhexidine, draped in the usual sterile fashion using maximum barrier technique (cap and mask, sterile gown, sterile gloves, large sterile sheet, hand hygiene and cutaneous antisepsis) and infiltrated  locally with 1% Lidocaine  with epinephrine . Ultrasound demonstrated patency of the right brachial vein, and this was documented with an image. Under real-time ultrasound guidance, this vein was accessed with a 21 gauge micropuncture needle and image documentation was performed. A 0.018 wire was introduced in to the vein. Over this, a 5 French dual lumen power injectable PICC was advanced to the lower SVC/right atrial junction. Fluoroscopy during the procedure and fluoro spot radiograph confirms appropriate catheter position. The catheter was flushed and covered with a isterile dressing. Catheter length: 34 cm IMPRESSION: Successful right arm power PICC line placement with ultrasound and fluoroscopic guidance. The catheter is ready for use. Performed by: Franky Rakers, PA-C Electronically Signed   By: Marcey Moan M.D.   On: 03/10/2024 09:13

## 2024-03-29 DIAGNOSIS — D701 Agranulocytosis secondary to cancer chemotherapy: Secondary | ICD-10-CM | POA: Diagnosis not present

## 2024-03-29 DIAGNOSIS — D61818 Other pancytopenia: Secondary | ICD-10-CM | POA: Diagnosis not present

## 2024-03-29 DIAGNOSIS — D709 Neutropenia, unspecified: Secondary | ICD-10-CM | POA: Diagnosis not present

## 2024-03-29 DIAGNOSIS — K5903 Drug induced constipation: Secondary | ICD-10-CM | POA: Diagnosis not present

## 2024-03-29 LAB — CBC WITH DIFFERENTIAL/PLATELET
Abs Immature Granulocytes: 0 K/uL (ref 0.00–0.07)
Abs Immature Granulocytes: 0.06 K/uL (ref 0.00–0.07)
Basophils Absolute: 0 K/uL (ref 0.0–0.1)
Basophils Absolute: 0 K/uL (ref 0.0–0.1)
Basophils Relative: 0 %
Basophils Relative: 1 %
Eosinophils Absolute: 0.1 K/uL (ref 0.0–0.5)
Eosinophils Absolute: 0.2 K/uL (ref 0.0–0.5)
Eosinophils Relative: 37 %
Eosinophils Relative: 21 %
HCT: 30.6 % — ABNORMAL LOW (ref 36.0–46.0)
HCT: 24.1 % — ABNORMAL LOW (ref 36.0–46.0)
Hemoglobin: 10.5 g/dL — ABNORMAL LOW (ref 12.0–15.0)
Hemoglobin: 8 g/dL — ABNORMAL LOW (ref 12.0–15.0)
Immature Granulocytes: 0 %
Immature Granulocytes: 7 %
Lymphocytes Relative: 44 %
Lymphocytes Relative: 32 %
Lymphs Abs: 0.1 K/uL — ABNORMAL LOW (ref 0.7–4.0)
Lymphs Abs: 0.3 K/uL — ABNORMAL LOW (ref 0.7–4.0)
MCH: 32.4 pg (ref 26.0–34.0)
MCH: 32.1 pg (ref 26.0–34.0)
MCHC: 34.3 g/dL (ref 30.0–36.0)
MCHC: 33.2 g/dL (ref 30.0–36.0)
MCV: 94.4 fL (ref 80.0–100.0)
MCV: 96.8 fL (ref 80.0–100.0)
Monocytes Absolute: 0 K/uL — ABNORMAL LOW (ref 0.1–1.0)
Monocytes Absolute: 0.1 K/uL (ref 0.1–1.0)
Monocytes Relative: 11 %
Monocytes Relative: 11 %
Neutro Abs: 0 K/uL — CL (ref 1.7–7.7)
Neutro Abs: 0.2 K/uL — CL (ref 1.7–7.7)
Neutrophils Relative %: 8 %
Neutrophils Relative %: 28 %
Platelets: 24 K/uL — CL (ref 150–400)
Platelets: 54 K/uL — ABNORMAL LOW (ref 150–400)
RBC: 3.24 MIL/uL — ABNORMAL LOW (ref 3.87–5.11)
RBC: 2.49 MIL/uL — ABNORMAL LOW (ref 3.87–5.11)
RDW: 12.8 % (ref 11.5–15.5)
RDW: 12.9 % (ref 11.5–15.5)
Smear Review: NORMAL
Smear Review: NORMAL
WBC: 0.3 K/uL — CL (ref 4.0–10.5)
WBC: 0.9 K/uL — CL (ref 4.0–10.5)
nRBC: 0 % (ref 0.0–0.2)
nRBC: 0 % (ref 0.0–0.2)

## 2024-03-29 LAB — PREPARE PLATELET PHERESIS: Unit division: 0

## 2024-03-29 LAB — BASIC METABOLIC PANEL WITH GFR
Anion gap: 9 (ref 5–15)
BUN: 7 mg/dL — ABNORMAL LOW (ref 8–23)
CO2: 26 mmol/L (ref 22–32)
Calcium: 8.4 mg/dL — ABNORMAL LOW (ref 8.9–10.3)
Chloride: 99 mmol/L (ref 98–111)
Creatinine, Ser: 0.52 mg/dL (ref 0.44–1.00)
GFR, Estimated: >60 mL/min (ref >60)
Glucose, Bld: 96 mg/dL (ref 70–99)
Potassium: 3.6 mmol/L (ref 3.5–5.1)
Sodium: 134 mmol/L — ABNORMAL LOW (ref 135–145)

## 2024-03-29 LAB — BPAM PLATELET PHERESIS
Blood Product Expiration Date: 202511282359
ISSUE DATE / TIME: 202511251130
Unit Type and Rh: 5100

## 2024-03-29 MED ORDER — BISACODYL 5 MG PO TBEC: 5.0000 mg | DELAYED_RELEASE_TABLET | Freq: Every day | ORAL | Status: DC | PRN

## 2024-03-29 MED ORDER — POLYETHYLENE GLYCOL 3350 17 G PO PACK
17.0000 g | PACK | Freq: Every day | ORAL | Status: DC
  Administered 2024-03-29 – 2024-03-30 (×2): 17 g via ORAL
  Filled 2024-03-29 (×2): qty 1

## 2024-03-29 NOTE — Progress Notes (Signed)
 Progress Note   Patient: Katrina [name] FMW:[id number] DOB: Sep 05, 1955 DOA: 03/26/2024  DOS: the patient was seen and examined on 03/29/2024   Brief hospital course:  68 year old with history of rheumatoid arthritis, anal cancer on chemoradiation, chronic pain, recent UTI due to Pseudomonas, chemo-induced neutropenia treated with Neupogen  day prior to admission presenting with fever.   Assessment and Plan:  Neutropenic fever/urinary tract infection (POA) - UTI noting pansensitive Pseudomonas.  Was transition to Cipro  11/20 with ongoing fevers.  States received empiric cefepime  while inpatient appears to be doing well.  Afebrile.  Appears resolving.  Pancytopenia - S/p 1 unit platelets.  GSF ordered.  Followed closely by oncology.  Plan to receive GSF tomorrow and can go home shortly after.  Counts looking mildly improved today.  Hemoglobin from 7.6 to 8.0.  Platelets up to 54.  WBCs from 0.7 to 0.9, ANC 200.  All counts showing uptrend.  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  On concurrent chemo/radiation.  Completed RT on 11/21.  Moderate constipation - Colace on board.  Patient still having anxiety about bowel movements.  Adding MiraLAX  daily as well as as needed Dulcolax p.o.  Avoiding suppositories and enemas.  Chronic pain - Continue opioid regimen.  Rheumatoid arthritis - Humira and methotrexate on hold while patient on chemotherapy.  Plans to follow-up with rheumatology in the outpatient setting.  Subjective: Patient resting comfortably this morning, husband at bedside.  States she feels well, no fevers, denies shortness of breath, fever, chills, chest pain, nausea, vomiting, abdominal pain.  Concerned about her constipation.  Has not had bowel movement since Sunday.  Had multiple bowel movements with lactulose  that day.  Physical Exam:  Vitals:   03/28/24 1155 03/28/24 1430 03/28/24 1442 03/28/24 2139  BP: 97/60 107/67 107/67 (!) 104/58   Pulse: 92 82 83 86  Resp: 18 18 16 18   Temp: 98.4 F (36.9 C) 98.4 F (36.9 C) 98.4 F (36.9 C) 98 F (36.7 C)  TempSrc: Oral Oral Oral   SpO2: 95% 97% 96% 98%  Weight:      Height:        GENERAL:  Alert, pleasant, no acute distress, frail HEENT:  EOMI CARDIOVASCULAR:  RRR, no murmurs appreciated RESPIRATORY:  Clear to auscultation, no wheezing, rales, or rhonchi GASTROINTESTINAL:  Soft, nontender, nondistended EXTREMITIES:  No LE edema bilaterally NEURO:  No new focal deficits appreciated SKIN:  No rashes noted PSYCH:  Appropriate mood and affect     Data Reviewed:  Imaging Studies: DG Chest 2 View if patient is not in a treatment room. Result Date: 03/26/2024 EXAM: 2 VIEW(S) XRAY OF THE CHEST 03/26/2024 01:52:50 PM COMPARISON: Chest x-ray dated 11/18/2017. CLINICAL HISTORY: Suspected Sepsis. FINDINGS: LUNGS AND PLEURA: No focal pulmonary opacity. No pleural effusion. No pneumothorax. HEART AND MEDIASTINUM: No acute abnormality of the cardiac and mediastinal silhouettes. BONES AND SOFT TISSUES: No acute osseous abnormality. IMPRESSION: 1. No acute cardiopulmonary process. Electronically signed by: Morgane Naveau MD 03/26/2024 01:56 PM EST RP Workstation: [id number]   CT ABDOMEN PELVIS W CONTRAST Result Date: 03/25/2024 CLINICAL DATA:  Abdominal pain. Constipation. History of he female cancer. EXAM: CT ABDOMEN AND PELVIS WITH CONTRAST TECHNIQUE: Multidetector CT imaging of the abdomen and pelvis was performed using the standard protocol following bolus administration of intravenous contrast. RADIATION DOSE REDUCTION: This exam was performed according to the departmental dose-optimization program which includes automated exposure control, adjustment of the mA and/or kV according to patient size and/or use  of iterative reconstruction technique. CONTRAST:  90mL OMNIPAQUE  IOHEXOL  300 MG/ML  SOLN COMPARISON:  CT abdomen pelvis dated 01/19/2024. FINDINGS: Lower chest: The visualized lung  bases are clear. No intra-abdominal free air or free fluid. Hepatobiliary: The liver is unremarkable. No biliary dilatation. The gallbladder is unremarkable Pancreas: Unremarkable. No pancreatic ductal dilatation or surrounding inflammatory changes. Spleen: Normal in size without focal abnormality. Adrenals/Urinary Tract: The adrenal glands, kidneys, visualized ureters, and urinary bladder appear unremarkable. Stomach/Bowel: Evaluation of the bowel is limited in the absence of oral contrast. There is no bowel obstruction or active inflammation. There is moderate stool throughout the colon. Appendectomy. Vascular/Lymphatic: The abdominal aorta and IVC are unremarkable. No portal venous gas. No adenopathy. Reproductive: Calcified uterine fibroid. No suspicious adnexal masses. Other: None Musculoskeletal: No acute or significant osseous findings. IMPRESSION: 1. No acute intra-abdominal or pelvic pathology. 2. Moderate colonic stool burden. No bowel obstruction. Electronically Signed   By: Vanetta Chou M.D.   On: 03/25/2024 16:22   IR PICC PLACEMENT RIGHT >5 YRS INC IMG GUIDE Result Date: 03/10/2024 INDICATION: Patient with history of anal cancer; central venous access requested for chemotherapy EXAM: RIGHT UPPER EXTREMITY PICC LINE PLACEMENT WITH ULTRASOUND AND FLUOROSCOPIC GUIDANCE MEDICATIONS: 3 mL 1% lidocaine  with epinephrine  to skin/subcutaneous tissue ANESTHESIA/SEDATION: None FLUOROSCOPY: Radiation Exposure Index (as provided by the fluoroscopic device): 1 mGy Kerma COMPLICATIONS: None immediate. PROCEDURE: The patient was advised of the possible risks and complications and agreed to undergo the procedure. The patient was then brought to the angiographic suite for the procedure. The right arm was prepped with chlorhexidine, draped in the usual sterile fashion using maximum barrier technique (cap and mask, sterile gown, sterile gloves, large sterile sheet, hand hygiene and cutaneous antisepsis) and  infiltrated locally with 1% Lidocaine  with epinephrine . Ultrasound demonstrated patency of the right brachial vein, and this was documented with an image. Under real-time ultrasound guidance, this vein was accessed with a 21 gauge micropuncture needle and image documentation was performed. A 0.018 wire was introduced in to the vein. Over this, a 5 French dual lumen power injectable PICC was advanced to the lower SVC/right atrial junction. Fluoroscopy during the procedure and fluoro spot radiograph confirms appropriate catheter position. The catheter was flushed and covered with a isterile dressing. Catheter length: 34 cm IMPRESSION: Successful right arm power PICC line placement with ultrasound and fluoroscopic guidance. The catheter is ready for use. Performed by: Franky Rakers, PA-C Electronically Signed   By: Marcey Moan M.D.   On: 03/10/2024 09:13    There are no new results to review at this time.  Previous records (including but not limited to H&P, progress notes, nursing notes, TOC management) were reviewed in assessment of this patient.  Labs: CBC: Recent Labs  Lab 03/26/24 1308 03/27/24 0539 03/27/24 0908 03/28/24 0555 03/29/24 0642  WBC 0.3* 0.3* 0.4* 0.7* 0.9*  NEUTROABS 0.0* 0.0* 0.1* 0.1* 0.2*  HGB 10.5* 9.3* 8.4* 7.6* 8.0*  HCT 30.6* 28.1* 25.1* 21.2* 24.1*  MCV 94.4 97.2 95.4 90.6 96.8  PLT 24* 11* 12* 17* 54*   Basic Metabolic Panel: Recent Labs  Lab 03/25/24 1416 03/26/24 1308 03/27/24 0539 03/28/24 0555 03/29/24 0642  NA 134* 130* 130* 131* 134*  K 4.0 3.4* 3.5 3.6 3.6  CL 97* 95* 98 97* 99  CO2 29 25 23 26 26   GLUCOSE 116* 119* 133* 109* 96  BUN 10 8 7* 10 7*  CREATININE 0.49 0.62 0.54 0.53 0.52  CALCIUM  9.0 8.5* 8.2* 7.9* 8.4*  MG  --   --  2.2 1.9  --    Liver Function Tests: Recent Labs  Lab 03/25/24 1416 03/26/24 1308 03/27/24 0539  AST 58* 45* 28  ALT 97* 80* 54*  ALKPHOS 68 65 52  BILITOT 0.5 0.5 0.5  PROT 6.3* 6.6 5.6*  ALBUMIN 3.2* 3.6  2.7*   CBG: No results for input(s): GLUCAP in the last 168 hours.  Scheduled Meds:  aluminum  sulfate-calcium  acetate  1 packet Topical TID   dicyclomine   10 mg Oral TID AC   docusate sodium   100 mg Oral BID   escitalopram   10 mg Oral Daily   feeding supplement  237 mL Oral BID BM   filgrastim  (NIVESTYM ) SQ  300 mcg Subcutaneous Q1200   morphine   15 mg Oral q morning   morphine   30 mg Oral QHS   polyethylene glycol  17 g Oral Daily   sodium chloride  flush  3 mL Intravenous Q12H   Continuous Infusions:  ceFEPime  (MAXIPIME ) IV 2 g (03/29/24 1031)   PRN Meds:.bisacodyl , glucagon  (human recombinant), hydrALAZINE , ipratropium-albuterol , magic mouthwash w/lidocaine , magnesium  citrate, magnesium  hydroxide, methocarbamol  (ROBAXIN ) injection, ondansetron  **OR** ondansetron  (ZOFRAN ) IV, mouth rinse, oxyCODONE , silver  sulfADIAZINE   Family Communication: Husband at bedside  Disposition: Status is: Inpatient Remains inpatient appropriate because: Neutropenia     Time spent: 42 minutes  Length of inpatient stay: 3 days  Author: Carliss LELON Canales, DO 03/29/2024 1:30 PM  For on call review [email or web address].

## 2024-03-29 NOTE — Hospital Course (Signed)
 68 year old with history of rheumatoid arthritis, anal cancer on chemoradiation, chronic pain, recent UTI due to Pseudomonas, chemo-induced neutropenia treated with Neupogen  day prior to admission presenting with fever.    Assessment and Plan:   Neutropenic fever/urinary tract infection (POA) - UTI noting pansensitive Pseudomonas.  Was transition to Cipro  11/20 with ongoing fevers.  States received empiric cefepime  while inpatient appears to be doing well.  Afebrile.  Appears resolving.   Pancytopenia - S/p 1 unit platelets.  GSF ordered.  Followed closely by oncology.  Plan to receive GSF tomorrow and can go home shortly after.  Counts looking mildly improved today.  Hemoglobin from 7.6 to 8.0.  Platelets up to 54.  WBCs from 0.7 to 0.9, ANC 200.  All counts showing uptrend.   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  On concurrent chemo/radiation.  Completed RT on 11/21.   Moderate constipation - Colace on board.  Patient still having anxiety about bowel movements.  Adding MiraLAX  daily as well as as needed Dulcolax p.o.  Avoiding suppositories and enemas.   Chronic pain - Continue opioid regimen.   Rheumatoid arthritis - Humira and methotrexate on hold while patient on chemotherapy.  Plans to follow-up with rheumatology in the outpatient setting.

## 2024-03-29 NOTE — Plan of Care (Signed)
  Problem: Health Behavior/Discharge Planning: Goal: Ability to manage health-related needs will improve Outcome: Progressing   Problem: Activity: Goal: Risk for activity intolerance will decrease Outcome: Progressing   Problem: Coping: Goal: Level of anxiety will decrease Outcome: Progressing   Problem: Pain Managment: Goal: General experience of comfort will improve and/or be controlled Outcome: Progressing   Problem: Skin Integrity: Goal: Risk for impaired skin integrity will decrease Outcome: Progressing

## 2024-03-29 NOTE — Plan of Care (Signed)
  Problem: Respiratory: Goal: Ability to maintain adequate ventilation will improve Outcome: Progressing   Problem: Education: Goal: Knowledge of General Education information will improve Description: Including pain rating scale, medication(s)/side effects and non-pharmacologic comfort measures Outcome: Progressing   Problem: Health Behavior/Discharge Planning: Goal: Ability to manage health-related needs will improve Outcome: Progressing   Problem: Clinical Measurements: Goal: Respiratory complications will improve Outcome: Progressing Goal: Cardiovascular complication will be avoided Outcome: Progressing

## 2024-03-30 DIAGNOSIS — D709 Neutropenia, unspecified: Secondary | ICD-10-CM | POA: Diagnosis not present

## 2024-03-30 DIAGNOSIS — M0579 Rheumatoid arthritis with rheumatoid factor of multiple sites without organ or systems involvement: Secondary | ICD-10-CM | POA: Diagnosis not present

## 2024-03-30 DIAGNOSIS — N3000 Acute cystitis without hematuria: Secondary | ICD-10-CM | POA: Diagnosis not present

## 2024-03-30 DIAGNOSIS — E440 Moderate protein-calorie malnutrition: Secondary | ICD-10-CM

## 2024-03-30 DIAGNOSIS — K5903 Drug induced constipation: Secondary | ICD-10-CM | POA: Diagnosis not present

## 2024-03-30 LAB — COMPREHENSIVE METABOLIC PANEL WITH GFR
ALT: 25 U/L (ref 0–44)
AST: 17 U/L (ref 15–41)
Albumin: 2.8 g/dL — ABNORMAL LOW (ref 3.5–5.0)
Alkaline Phosphatase: 49 U/L (ref 38–126)
Anion gap: 6 (ref 5–15)
BUN: 5 mg/dL — ABNORMAL LOW (ref 8–23)
CO2: 29 mmol/L (ref 22–32)
Calcium: 8.7 mg/dL — ABNORMAL LOW (ref 8.9–10.3)
Chloride: 99 mmol/L (ref 98–111)
Creatinine, Ser: 0.54 mg/dL (ref 0.44–1.00)
GFR, Estimated: >60 mL/min (ref >60)
Glucose, Bld: 99 mg/dL (ref 70–99)
Potassium: 3.9 mmol/L (ref 3.5–5.1)
Sodium: 134 mmol/L — ABNORMAL LOW (ref 135–145)
Total Bilirubin: 0.3 mg/dL (ref 0.0–1.2)
Total Protein: 5.8 g/dL — ABNORMAL LOW (ref 6.5–8.1)

## 2024-03-30 LAB — CBC
HCT: 23.5 % — ABNORMAL LOW (ref 36.0–46.0)
Hemoglobin: 7.9 g/dL — ABNORMAL LOW (ref 12.0–15.0)
MCH: 32.5 pg (ref 26.0–34.0)
MCHC: 33.6 g/dL (ref 30.0–36.0)
MCV: 96.7 fL (ref 80.0–100.0)
Platelets: 56 K/uL — ABNORMAL LOW (ref 150–400)
RBC: 2.43 MIL/uL — ABNORMAL LOW (ref 3.87–5.11)
RDW: 13.1 % (ref 11.5–15.5)
WBC: 1.8 K/uL — ABNORMAL LOW (ref 4.0–10.5)
nRBC: 2.8 % — ABNORMAL HIGH (ref 0.0–0.2)

## 2024-03-30 LAB — MAGNESIUM: Magnesium: 1.9 mg/dL (ref 1.7–2.4)

## 2024-03-30 MED ORDER — DOCUSATE SODIUM 100 MG PO CAPS
100.0000 mg | ORAL_CAPSULE | Freq: Two times a day (BID) | ORAL | Status: AC
Start: 2024-03-30 — End: ?

## 2024-03-30 MED ORDER — MORPHINE SULFATE ER 15 MG PO TBCR: 15.0000 mg | EXTENDED_RELEASE_TABLET | Freq: Every morning | ORAL | Status: AC

## 2024-03-30 MED ORDER — BISACODYL 5 MG PO TBEC: 5.0000 mg | DELAYED_RELEASE_TABLET | Freq: Every day | ORAL | Status: DC | PRN

## 2024-03-30 MED ORDER — DICYCLOMINE HCL 10 MG PO CAPS
10.0000 mg | ORAL_CAPSULE | Freq: Three times a day (TID) | ORAL | 0 refills | Status: AC
Start: 2024-03-30 — End: ?

## 2024-03-30 MED ORDER — MORPHINE SULFATE ER 30 MG PO TBCR: 30.0000 mg | EXTENDED_RELEASE_TABLET | Freq: Every day | ORAL | Status: DC

## 2024-03-30 MED ORDER — POLYETHYLENE GLYCOL 3350 17 G PO PACK: 17.0000 g | PACK | Freq: Every day | ORAL | Status: AC

## 2024-03-30 MED ORDER — DICYCLOMINE HCL 10 MG PO CAPS: 10.0000 mg | ORAL_CAPSULE | Freq: Three times a day (TID) | ORAL | Status: DC

## 2024-03-30 NOTE — Discharge Summary (Addendum)
 Physician Discharge Summary   Patient: Katrina [name] MRN: [id number] DOB: 22-May-1955  Admit date:     03/26/2024  Discharge date: 03/30/24  Discharge Physician: Carliss LELON Canales   PCP: [name], MD   Recommendations at discharge:    Pt to be discharged home.   If you experience worsening fever, chills, chest pain, shortness of breath, or other concerning symptoms, please call your PCP or go to the emergency department immediately.  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 [facility] Course:  68 year old with history of rheumatoid arthritis, anal cancer on chemoradiation, chronic pain, recent UTI due to Pseudomonas, chemo-induced neutropenia treated with Neupogen  day prior to admission presenting with fever.    Assessment and Plan:   Neutropenic fever/urinary tract infection (POA) - UTI noting pansensitive Pseudomonas.  Was transition to Cipro  11/20 with ongoing fevers.  States received empiric cefepime  while inpatient appears to be doing well.  Afebrile.  Appears resolved.  Completed IV antibiotic therapy while inpatient.  No need to continue antibiotics upon discharge.   Pancytopenia - S/p 1 unit platelets.  GSF ordered.  Followed closely by oncology.  Plan to receive GSF today and can go home shortly after.  Counts looking mildly improved again today.  WBCs up to 1.8.  All counts showing uptrend.  Can follow-up with oncology in the outpatient setting.   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  On concurrent chemo/radiation.  Completed RT on 11/21.   Moderate  constipation - Colace on board.  Patient still having anxiety about bowel movements.  Adding MiraLAX  daily as well as as needed Dulcolax p.o.  Avoiding suppositories and enemas.  Patient could resume OTC laxatives as needed.   Chronic pain - Continue opioid regimen.  Follow-up with oncology.   Rheumatoid arthritis - Humira and methotrexate on hold while patient on chemotherapy.  Plans to follow-up with rheumatology in the outpatient setting.   Consultants: Oncology Procedures performed: None Disposition: Home Diet recommendation:  Discharge Diet Orders (From admission, onward)     Start     Ordered   03/30/24 0000  Diet - low sodium heart healthy        03/30/24 1016           Regular diet  DISCHARGE MEDICATION: Allergies as of 03/30/2024   No Known Allergies      Medication List     STOP taking these medications    ciprofloxacin  750 MG tablet Commonly known as: CIPRO    folic acid  1 MG tablet Commonly known as: FOLVITE    Humira (2 Pen) 40 MG/0.4ML pen Generic drug: adalimumab   lactulose  10 GM/15ML solution Commonly known as: CHRONULAC    methotrexate 2.5 MG tablet Commonly known as: RHEUMATREX   oxyCODONE  10 mg 12 hr tablet Commonly known as: OXYCONTIN    oxyCODONE  5 MG immediate release tablet Commonly known as: Oxy IR/ROXICODONE    sulfamethoxazole -trimethoprim  800-160 MG tablet Commonly known as: BACTRIM  DS   traMADol  50 MG tablet Commonly known as: ULTRAM        TAKE these medications    aluminum  sulfate-calcium  acetate packet Commonly known  as: DOMEBORO Apply 1 packet topically 3 (three) times daily. What changed: when to take this   bisacodyl  5 MG EC tablet Commonly known as: DULCOLAX Take 1 tablet (5 mg total) by mouth daily as needed for moderate constipation.   calcium  carbonate 600 MG Tabs tablet Commonly known as: OS-CAL Take 600 mg by mouth daily.   cholecalciferol 1000 units tablet Commonly known as: VITAMIN D  Take 1,000  Units by mouth daily.   clonazePAM  0.1 mg/mL Susp Commonly known as: KLONOPIN  Take 5 mLs (0.5 mg total) by mouth 2 (two) times daily as needed. What changed: reasons to take this   denosumab  60 MG/ML Sosy injection Commonly known as: PROLIA  Inject 60 mg into the skin every 6 (six) months.   dicyclomine  10 MG capsule Commonly known as: BENTYL  Take 1 capsule (10 mg total) by mouth 3 (three) times daily before meals. What changed: when to take this   docusate sodium  100 MG capsule Commonly known as: COLACE Take 1 capsule (100 mg total) by mouth 2 (two) times daily.   escitalopram  10 MG tablet Commonly known as: LEXAPRO  Take 1 tablet (10 mg total) by mouth daily.   hydrOXYzine  10 MG tablet Commonly known as: ATARAX  Take 1 tablet (10 mg total) by mouth 3 (three) times daily as needed for anxiety.   LORazepam  0.5 MG tablet Commonly known as: ATIVAN  Take 1 tablet (0.5 mg total) by mouth at bedtime. What changed:  when to take this reasons to take this   magic mouthwash w/lidocaine  Soln Swish and spit up to 4 times a day as needed for mouth pain. Suspension contains equal amounts of Maalox Extra Strength, nystatin, diphenhydramine and lidocaine .   meloxicam 15 MG tablet Commonly known as: MOBIC Take 1 tablet by mouth as needed for pain.   morphine  30 MG 12 hr tablet Commonly known as: MS CONTIN  Take 1 tablet (30 mg total) by mouth at bedtime. What changed:  medication strength how much to take when to take this   morphine  15 MG 12 hr tablet Commonly known as: MS CONTIN  Take 1 tablet (15 mg total) by mouth every morning. Start taking on: March 31, 2024 What changed: You were already taking a medication with the same name, and this prescription was added. Make sure you understand how and when to take each.   NON FORMULARY Diltiazem 2%/Lidocaine5% compound Apply to rectum 3 times daily for 8 weeks   ondansetron  8 MG tablet Commonly known as: Zofran  Take 1 tablet  (8 mg total) by mouth every 8 (eight) hours as needed for nausea or vomiting.   polyethylene glycol 17 g packet Commonly known as: MIRALAX  / GLYCOLAX  Take 17 g by mouth daily. Start taking on: March 31, 2024   prochlorperazine  10 MG tablet Commonly known as: COMPAZINE  Take 1 tablet (10 mg total) by mouth every 6 (six) hours as needed for nausea or vomiting.   vitamin C 1000 MG tablet Take 1,000 mg by mouth daily.         Discharge Exam: Filed Weights   03/26/24 1237 03/26/24 1623  Weight: 52.2 kg 55.2 kg    GENERAL:  Alert, pleasant, no acute distress, frail HEENT:  EOMI CARDIOVASCULAR:  RRR, no murmurs appreciated RESPIRATORY:  Clear to auscultation, no wheezing, rales, or rhonchi GASTROINTESTINAL:  Soft, nontender, nondistended EXTREMITIES:  No LE edema bilaterally NEURO:  No new focal deficits appreciated SKIN:  No rashes noted PSYCH:  Appropriate mood and affect    Condition at discharge:  improving  The results of significant diagnostics from this hospitalization (including imaging, microbiology, ancillary and laboratory) are listed below for reference.   Imaging Studies: DG Chest 2 View if patient is not in a treatment room. Result Date: 03/26/2024 EXAM: 2 VIEW(S) XRAY OF THE CHEST 03/26/2024 01:52:50 PM COMPARISON: Chest x-ray dated 11/18/2017. CLINICAL HISTORY: Suspected Sepsis. FINDINGS: LUNGS AND PLEURA: No focal pulmonary opacity. No pleural effusion. No pneumothorax. HEART AND MEDIASTINUM: No acute abnormality of the cardiac and mediastinal silhouettes. BONES AND SOFT TISSUES: No acute osseous abnormality. IMPRESSION: 1. No acute cardiopulmonary process. Electronically signed by: Morgane Naveau MD 03/26/2024 01:56 PM EST RP Workstation: [id number]   CT ABDOMEN PELVIS W CONTRAST Result Date: 03/25/2024 CLINICAL DATA:  Abdominal pain. Constipation. History of he female cancer. EXAM: CT ABDOMEN AND PELVIS WITH CONTRAST TECHNIQUE: Multidetector CT imaging of the  abdomen and pelvis was performed using the standard protocol following bolus administration of intravenous contrast. RADIATION DOSE REDUCTION: This exam was performed according to the departmental dose-optimization program which includes automated exposure control, adjustment of the mA and/or kV according to patient size and/or use of iterative reconstruction technique. CONTRAST:  90mL OMNIPAQUE  IOHEXOL  300 MG/ML  SOLN COMPARISON:  CT abdomen pelvis dated 01/19/2024. FINDINGS: Lower chest: The visualized lung bases are clear. No intra-abdominal free air or free fluid. Hepatobiliary: The liver is unremarkable. No biliary dilatation. The gallbladder is unremarkable Pancreas: Unremarkable. No pancreatic ductal dilatation or surrounding inflammatory changes. Spleen: Normal in size without focal abnormality. Adrenals/Urinary Tract: The adrenal glands, kidneys, visualized ureters, and urinary bladder appear unremarkable. Stomach/Bowel: Evaluation of the bowel is limited in the absence of oral contrast. There is no bowel obstruction or active inflammation. There is moderate stool throughout the colon. Appendectomy. Vascular/Lymphatic: The abdominal aorta and IVC are unremarkable. No portal venous gas. No adenopathy. Reproductive: Calcified uterine fibroid. No suspicious adnexal masses. Other: None Musculoskeletal: No acute or significant osseous findings. IMPRESSION: 1. No acute intra-abdominal or pelvic pathology. 2. Moderate colonic stool burden. No bowel obstruction. Electronically Signed   By: Vanetta Chou M.D.   On: 03/25/2024 16:22   IR PICC PLACEMENT RIGHT >5 YRS INC IMG GUIDE Result Date: 03/10/2024 INDICATION: Patient with history of anal cancer; central venous access requested for chemotherapy EXAM: RIGHT UPPER EXTREMITY PICC LINE PLACEMENT WITH ULTRASOUND AND FLUOROSCOPIC GUIDANCE MEDICATIONS: 3 mL 1% lidocaine  with epinephrine  to skin/subcutaneous tissue ANESTHESIA/SEDATION: None FLUOROSCOPY: Radiation  Exposure Index (as provided by the fluoroscopic device): 1 mGy Kerma COMPLICATIONS: None immediate. PROCEDURE: The patient was advised of the possible risks and complications and agreed to undergo the procedure. The patient was then brought to the angiographic suite for the procedure. The right arm was prepped with chlorhexidine, draped in the usual sterile fashion using maximum barrier technique (cap and mask, sterile gown, sterile gloves, large sterile sheet, hand hygiene and cutaneous antisepsis) and infiltrated locally with 1% Lidocaine  with epinephrine . Ultrasound demonstrated patency of the right brachial vein, and this was documented with an image. Under real-time ultrasound guidance, this vein was accessed with a 21 gauge micropuncture needle and image documentation was performed. A 0.018 wire was introduced in to the vein. Over this, a 5 French dual lumen power injectable PICC was advanced to the lower SVC/right atrial junction. Fluoroscopy during the procedure and fluoro spot radiograph confirms appropriate catheter position. The catheter was flushed and covered with a isterile dressing. Catheter length: 34 cm IMPRESSION: Successful right arm power PICC line placement with ultrasound and fluoroscopic guidance. The catheter is ready  for use. Performed by: Franky Rakers, PA-C Electronically Signed   By: Marcey Moan M.D.   On: 03/10/2024 09:13    Microbiology: Results for orders placed or performed during the hospital encounter of 03/26/24  Culture, blood (Routine x 2)     Status: None (Preliminary result)   Collection Time: 03/26/24  1:08 PM   Specimen: BLOOD  Result Value Ref Range Status   Specimen Description BLOOD LEFT ANTECUBITAL  Final   Special Requests   Final    BOTTLES DRAWN AEROBIC AND ANAEROBIC Blood Culture adequate volume   Culture   Final    NO GROWTH 4 DAYS Performed at [facility], [address], [place], [place] [address]    Report Status PENDING  Incomplete   Urine Culture     Status: None   Collection Time: 03/26/24  1:08 PM   Specimen: Urine, Random  Result Value Ref Range Status   Specimen Description   Final    URINE, RANDOM Performed at [facility], [address] [place], [place] [address]    Special Requests   Final    NONE Reflexed from [id number] Performed at [facility], [address], [place], [place] [address]    Culture   Final    NO GROWTH Performed at [facility], [address], [place], [place] [address]    Report Status 03/27/2024 FINAL  Final  Culture, blood (Routine x 2)     Status: None (Preliminary result)   Collection Time: 03/26/24  1:12 PM   Specimen: BLOOD  Result Value Ref Range Status   Specimen Description BLOOD LEFT ANTECUBITAL  Final   Special Requests   Final    BOTTLES DRAWN AEROBIC AND ANAEROBIC Blood Culture adequate volume   Culture   Final    NO GROWTH 4 DAYS Performed at [facility], [address] [place], [place] [address]    Report Status PENDING  Incomplete  Resp panel by RT-PCR (RSV, Flu A&B, Covid) Anterior Nasal Swab     Status: None   Collection Time: 03/26/24  2:09 PM   Specimen: Anterior Nasal Swab  Result Value Ref Range Status   SARS Coronavirus 2 by RT PCR NEGATIVE NEGATIVE Final    Comment: (NOTE) SARS-CoV-2 target nucleic acids are NOT DETECTED.  The SARS-CoV-2 RNA is generally detectable in upper respiratory specimens during the acute phase of infection. The lowest concentration of SARS-CoV-2 viral copies this assay can detect is 138 copies/mL. A negative result does not preclude SARS-Cov-2 infection and should not be used as the sole basis for treatment or other patient management decisions. A negative result may occur with  improper specimen collection/handling, submission of specimen other than nasopharyngeal swab, presence of viral mutation(s) within the areas targeted by this assay, and inadequate number of  viral copies(<138 copies/mL). A negative result must be combined with clinical observations, patient history, and epidemiological information. The expected result is Negative.  Fact Sheet for Patients:  bloggercourse.com  Fact Sheet for Healthcare Providers:  seriousbroker.it  This test is no t yet approved or cleared by the [place]  FDA and  has been authorized for detection and/or diagnosis of SARS-CoV-2 by FDA under an Emergency Use Authorization (EUA). This EUA will remain  in effect (meaning this test can be used) for the duration of the COVID-19 declaration under Section 564(b)(1) of the Act, 21 U.S.C.section 360bbb-3(b)(1), unless the authorization is terminated  or revoked sooner.  Influenza A by PCR NEGATIVE NEGATIVE Final   Influenza B by PCR NEGATIVE NEGATIVE Final    Comment: (NOTE) The Xpert Xpress SARS-CoV-2/FLU/RSV plus assay is intended as an aid in the diagnosis of influenza from Nasopharyngeal swab specimens and should not be used as a sole basis for treatment. Nasal washings and aspirates are unacceptable for Xpert Xpress SARS-CoV-2/FLU/RSV testing.  Fact Sheet for Patients: bloggercourse.com  Fact Sheet for Healthcare Providers: seriousbroker.it  This test is not yet approved or cleared by the [place]  FDA and has been authorized for detection and/or diagnosis of SARS-CoV-2 by FDA under an Emergency Use Authorization (EUA). This EUA will remain in effect (meaning this test can be used) for the duration of the COVID-19 declaration under Section 564(b)(1) of the Act, 21 U.S.C. section 360bbb-3(b)(1), unless the authorization is terminated or revoked.     Resp Syncytial Virus by PCR NEGATIVE NEGATIVE Final    Comment: (NOTE) Fact Sheet for Patients: bloggercourse.com  Fact Sheet for Healthcare  Providers: seriousbroker.it  This test is not yet approved or cleared by the [place]  FDA and has been authorized for detection and/or diagnosis of SARS-CoV-2 by FDA under an Emergency Use Authorization (EUA). This EUA will remain in effect (meaning this test can be used) for the duration of the COVID-19 declaration under Section 564(b)(1) of the Act, 21 U.S.C. section 360bbb-3(b)(1), unless the authorization is terminated or revoked.  Performed at [facility], [address] [place], [place] [address]     Labs: CBC: Recent Labs  Lab 03/26/24 1308 03/27/24 0539 03/27/24 0908 03/28/24 0555 03/29/24 0642 03/30/24 0552  WBC 0.3* 0.3* 0.4* 0.7* 0.9* 1.8*  NEUTROABS 0.0* 0.0* 0.1* 0.1* 0.2*  --   HGB 10.5* 9.3* 8.4* 7.6* 8.0* 7.9*  HCT 30.6* 28.1* 25.1* 21.2* 24.1* 23.5*  MCV 94.4 97.2 95.4 90.6 96.8 96.7  PLT 24* 11* 12* 17* 54* 56*   Basic Metabolic Panel: Recent Labs  Lab 03/26/24 1308 03/27/24 0539 03/28/24 0555 03/29/24 0642 03/30/24 0552  NA 130* 130* 131* 134* 134*  K 3.4* 3.5 3.6 3.6 3.9  CL 95* 98 97* 99 99  CO2 25 23 26 26 29   GLUCOSE 119* 133* 109* 96 99  BUN 8 7* 10 7* 5*  CREATININE 0.62 0.54 0.53 0.52 0.54  CALCIUM  8.5* 8.2* 7.9* 8.4* 8.7*  MG  --  2.2 1.9  --  1.9   Liver Function Tests: Recent Labs  Lab 03/25/24 1416 03/26/24 1308 03/27/24 0539 03/30/24 0552  AST 58* 45* 28 17  ALT 97* 80* 54* 25  ALKPHOS 68 65 52 49  BILITOT 0.5 0.5 0.5 0.3  PROT 6.3* 6.6 5.6* 5.8*  ALBUMIN 3.2* 3.6 2.7* 2.8*   CBG: No results for input(s): GLUCAP in the last 168 hours.  Discharge time spent: 25 minutes.  Length of inpatient stay: 4 days  Signed: Carliss LELON Canales, DO [facility] 03/30/2024

## 2024-03-30 NOTE — TOC Transition Note (Signed)
 Transition of Care [facility]) - Discharge Note   Patient Details  Name: Katrina [name] MRN: [id number] Date of Birth: 01-Oct-1955  [facility] CM/SW Contact:  Bascom Service, RN Phone Number: 03/30/2024, 11:08 AM   Clinical Narrative:d/c home no CM needs.       Final next level of care: Home/Self Care Barriers to Discharge: No Barriers Identified   Patient Goals and CMS Choice Patient states their goals for this hospitalization and ongoing recovery are:: Home CMS Medicare.gov Compare Post Acute Care list provided to:: Patient Choice offered to / list presented to : Patient [place] ownership interest in [facility].provided to:: Patient    Discharge Placement                       Discharge Plan and Services Additional resources added to the After Visit Summary for     Discharge Planning Services: CM Consult                                 Social Drivers of Health (SDOH) Interventions SDOH Screenings   Food Insecurity: No Food Insecurity (03/26/2024)  Housing: Low Risk  (03/26/2024)  Transportation Needs: No Transportation Needs (03/26/2024)  Utilities: Not At Risk (03/26/2024)  Alcohol Screen: Low Risk  (05/10/2020)  Depression (PHQ2-9): Low Risk  (03/17/2024)  Financial Resource Strain: Low Risk  (11/18/2023)  Physical Activity: Sufficiently Active (11/18/2023)  Social Connections: Moderately Isolated (03/26/2024)  Stress: No Stress Concern Present (11/18/2023)  Tobacco Use: Medium Risk (03/26/2024)  Health Literacy: Adequate Health Literacy (05/12/2023)     Readmission Risk Interventions     No data to display

## 2024-03-30 NOTE — Plan of Care (Signed)

## 2024-03-31 ENCOUNTER — Telehealth (HOSPITAL_COMMUNITY): Payer: Self-pay | Admitting: Pharmacy Technician

## 2024-03-31 ENCOUNTER — Other Ambulatory Visit (HOSPITAL_COMMUNITY): Payer: Self-pay

## 2024-03-31 ENCOUNTER — Encounter: Payer: Self-pay | Admitting: Oncology

## 2024-03-31 ENCOUNTER — Encounter: Payer: Self-pay | Admitting: Internal Medicine

## 2024-03-31 LAB — CULTURE, BLOOD (ROUTINE X 2)
Culture: NO GROWTH
Culture: NO GROWTH
Special Requests: ADEQUATE
Special Requests: ADEQUATE

## 2024-03-31 NOTE — Telephone Encounter (Signed)
 Pharmacy Patient Advocate Encounter  Received notification from [facility] that Prior Authorization for Dicyclomine  HCl 10MG  capsules  has been APPROVED from 03/31/2024 to 05/03/2024   PA #/Case ID/Reference #: [id number]

## 2024-03-31 NOTE — Telephone Encounter (Signed)
 Pharmacy Patient Advocate Encounter   Received notification from Fax that prior authorization for Dicyclomine  HCl 10MG  capsules  is required/requested.   Insurance verification completed.   The patient is insured through [facility].   Per test claim: PA required; PA submitted to above mentioned insurance via Latent Key/confirmation #/EOC [id number] Status is pending

## 2024-04-03 ENCOUNTER — Telehealth: Payer: Self-pay | Admitting: Radiation Oncology

## 2024-04-03 ENCOUNTER — Telehealth: Payer: Self-pay

## 2024-04-03 NOTE — Telephone Encounter (Signed)
 Called pt, spoke to pt's spouse to schedule skin check as requested by nurse Eudora. Pt's husband agreeable to 12/2@1pm .

## 2024-04-03 NOTE — Telephone Encounter (Signed)
 Followed up with patient per phone call per Dr. Clovis advice that it was okay to delay her Prolia  injection and that she should follow up with her Rheumatologist. Patient is going to wait a little longer for her Prolia  and agreed with Dr. Autumn.

## 2024-04-04 ENCOUNTER — Inpatient Hospital Stay

## 2024-04-04 ENCOUNTER — Ambulatory Visit (HOSPITAL_COMMUNITY)
Admission: RE | Admit: 2024-04-04 | Discharge: 2024-04-04 | Disposition: A | Source: Ambulatory Visit | Attending: Vascular Surgery

## 2024-04-04 ENCOUNTER — Encounter: Payer: Self-pay | Admitting: Radiation Oncology

## 2024-04-04 ENCOUNTER — Ambulatory Visit
Admission: RE | Admit: 2024-04-04 | Discharge: 2024-04-04 | Disposition: A | Discharge status: Home / Self Care | Source: Ambulatory Visit | Attending: Radiation Oncology | Admitting: Radiation Oncology

## 2024-04-04 ENCOUNTER — Other Ambulatory Visit: Payer: Self-pay

## 2024-04-04 VITALS — BP 119/79 | HR 87 | Temp 97.5°F | Resp 18 | Ht 62.0 in | Wt 123.5 lb

## 2024-04-04 DIAGNOSIS — Z923 Personal history of irradiation: Secondary | ICD-10-CM | POA: Diagnosis not present

## 2024-04-04 DIAGNOSIS — L299 Pruritus, unspecified: Secondary | ICD-10-CM | POA: Insufficient documentation

## 2024-04-04 DIAGNOSIS — R600 Localized edema: Secondary | ICD-10-CM | POA: Insufficient documentation

## 2024-04-04 DIAGNOSIS — M7989 Other specified soft tissue disorders: Secondary | ICD-10-CM

## 2024-04-04 DIAGNOSIS — C218 Malignant neoplasm of overlapping sites of rectum, anus and anal canal: Secondary | ICD-10-CM | POA: Insufficient documentation

## 2024-04-04 MED ORDER — TRIAMCINOLONE ACETONIDE 0.5 % EX OINT: 1.0000 | TOPICAL_OINTMENT | Freq: Two times a day (BID) | CUTANEOUS | 0 refills | Status: AC

## 2024-04-04 NOTE — Addendum Note (Signed)
 Encounter addended by: Claudene Eudora GAILS, LPN on: 87/11/7972 2:25 PM  Actions taken: Clinical Note Signed

## 2024-04-04 NOTE — Progress Notes (Signed)
 Radiation Oncology         [phone] ________________________________  Name: Katrina [name] MRN: [id number]  Date: 04/04/2024  DOB: 06/21/1955  Follow-Up Visit Note   CC: Baxley, Ronal PARAS, MD  [name], MD  Diagnosis:   ICD-10-CM   1. 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 She is experiencing pruritus of the bilateral groin and mons pubis area and has tenderness and pain in the perineum especially around the anus and vagina. Using domeboro soaks and silvadene . Silvadene  has been very helpful on the areas on which they've applied it. Reports new bilateral lower extremity edema.   ALLERGIES:  has no known allergies.  Meds: Current Outpatient Medications  Medication Sig Dispense Refill   triamcinolone ointment (KENALOG) 0.5 % Apply 1 Application topically 2 (two) times daily. 45 g 0   aluminum  sulfate-calcium  acetate (DOMEBORO) packet Apply 1 packet topically 3 (three) times daily. (Patient taking differently: Apply 1 packet topically daily at 12 noon.) 100 each 0   Ascorbic Acid (VITAMIN C) 1000 MG tablet Take 1,000 mg by mouth daily.     bisacodyl  (DULCOLAX) 5 MG EC tablet Take 1 tablet (5 mg total) by mouth daily as needed for moderate constipation.     calcium  carbonate (OS-CAL) 600 MG TABS Take 600 mg by mouth daily.     cholecalciferol (VITAMIN D ) 1000 UNITS tablet Take 1,000 Units by mouth daily.     clonazePAM  (KLONOPIN ) 0.1 mg/mL SUSP Take 5 mLs (0.5 mg total) by mouth 2 (two) times daily as needed. (Patient taking differently: Take 0.5 mg by mouth 2 (two) times daily as needed (burning mouth).) 300 mL 1   denosumab  (PROLIA ) 60 MG/ML SOSY injection Inject 60 mg into the skin every 6 (six) months.     dicyclomine  (BENTYL ) 10 MG capsule Take 1 capsule (10 mg total) by mouth 3 (three) times daily before meals. 90  capsule 0   docusate sodium  (COLACE) 100 MG capsule Take 1 capsule (100 mg total) by mouth 2 (two) times daily.     escitalopram  (LEXAPRO ) 10 MG tablet Take 1 tablet (10 mg total) by mouth daily. 30 tablet 11   hydrOXYzine  (ATARAX ) 10 MG tablet Take 1 tablet (10 mg total) by mouth 3 (three) times daily as needed for anxiety. 30 tablet 0   LORazepam  (ATIVAN ) 0.5 MG tablet Take 1 tablet (0.5 mg total) by mouth at bedtime. (Patient taking differently: Take 0.5 mg by mouth at bedtime as needed for anxiety, seizure, sedation or sleep.) 15 tablet 0   magic mouthwash w/lidocaine  SOLN Swish and spit up to 4 times a day as needed for mouth pain. Suspension contains equal amounts of Maalox Extra Strength, nystatin, diphenhydramine and lidocaine . 1200 mL 0   meloxicam (MOBIC) 15 MG tablet Take 1 tablet by mouth as needed for pain.     morphine  (MS CONTIN ) 15 MG 12 hr tablet Take 1 tablet (15 mg total) by mouth every morning.     morphine  (MS CONTIN ) 30 MG 12 hr tablet Take 1 tablet (30 mg total) by mouth at bedtime.     NON FORMULARY Diltiazem 2%/Lidocaine5% compound Apply to rectum 3 times daily for 8 weeks (Patient not taking: Reported on 03/27/2024) 30 g 1   ondansetron  (ZOFRAN ) 8 MG tablet Take 1 tablet (8 mg total)  by mouth every 8 (eight) hours as needed for nausea or vomiting. 30 tablet 1   polyethylene glycol (MIRALAX  / GLYCOLAX ) 17 g packet Take 17 g by mouth daily.     prochlorperazine  (COMPAZINE ) 10 MG tablet Take 1 tablet (10 mg total) by mouth every 6 (six) hours as needed for nausea or vomiting. 30 tablet 1   No current facility-administered medications for this encounter.    Physical Findings: The patient is in no acute distress. Patient is alert and oriented.  height is 5' 2 (1.575 m) and weight is 123 lb 8 oz (56 kg). Her temporal temperature is 97.5 F (36.4 C) (abnormal). Her blood pressure is 119/79 and her pulse is 87. Her respiration is 18 and oxygen saturation is 97%. .    Physical  Exam Vitals reviewed. Exam conducted with a chaperone present.  Constitutional:      General: She is not in acute distress. HENT:     Head: Normocephalic.  Eyes:     Extraocular Movements: Extraocular movements intact.  Cardiovascular:     Rate and Rhythm: Normal rate.  Pulmonary:     Effort: Pulmonary effort is normal.  Abdominal:     General: There is no distension.  Genitourinary:    Comments:  -Groins: Bilateral hyperpigmentation with dry desquamation. -Mons pubis: Folliculitis present. -Perineum (adjacent to vaginal introitus): Healing moist desquamation. -Labia majora/minora and clitoral hood: Hyperpigmentation with desquamation. -Perianal region: Erythematous and moist without frank desquamation. Musculoskeletal:        General: Swelling present.     Right lower leg: Edema present.     Left lower leg: Edema present.  Skin:    General: Skin is warm and dry.  Neurological:     General: No focal deficit present.     Mental Status: She is alert.  Psychiatric:        Mood and Affect: Mood normal.     Lab Findings: Lab Results  Component Value Date   WBC 1.8 (L) 03/30/2024   HGB 7.9 (L) 03/30/2024   HCT 23.5 (L) 03/30/2024   MCV 96.7 03/30/2024   PLT 56 (L) 03/30/2024    Radiographic Findings: DG Chest 2 View if patient is not in a treatment room. Result Date: 03/26/2024 EXAM: 2 VIEW(S) XRAY OF THE CHEST 03/26/2024 01:52:50 PM COMPARISON: Chest x-ray dated 11/18/2017. CLINICAL HISTORY: Suspected Sepsis. FINDINGS: LUNGS AND PLEURA: No focal pulmonary opacity. No pleural effusion. No pneumothorax. HEART AND MEDIASTINUM: No acute abnormality of the cardiac and mediastinal silhouettes. BONES AND SOFT TISSUES: No acute osseous abnormality. IMPRESSION: 1. No acute cardiopulmonary process. Electronically signed by: Morgane Naveau MD 03/26/2024 01:56 PM EST RP Workstation: [id number]   CT ABDOMEN PELVIS W CONTRAST Result Date: 03/25/2024 CLINICAL DATA:  Abdominal pain.  Constipation. History of he female cancer. EXAM: CT ABDOMEN AND PELVIS WITH CONTRAST TECHNIQUE: Multidetector CT imaging of the abdomen and pelvis was performed using the standard protocol following bolus administration of intravenous contrast. RADIATION DOSE REDUCTION: This exam was performed according to the departmental dose-optimization program which includes automated exposure control, adjustment of the mA and/or kV according to patient size and/or use of iterative reconstruction technique. CONTRAST:  90mL OMNIPAQUE  IOHEXOL  300 MG/ML  SOLN COMPARISON:  CT abdomen pelvis dated 01/19/2024. FINDINGS: Lower chest: The visualized lung bases are clear. No intra-abdominal free air or free fluid. Hepatobiliary: The liver is unremarkable. No biliary dilatation. The gallbladder is unremarkable Pancreas: Unremarkable. No pancreatic ductal dilatation or surrounding inflammatory changes. Spleen: Normal in size  without focal abnormality. Adrenals/Urinary Tract: The adrenal glands, kidneys, visualized ureters, and urinary bladder appear unremarkable. Stomach/Bowel: Evaluation of the bowel is limited in the absence of oral contrast. There is no bowel obstruction or active inflammation. There is moderate stool throughout the colon. Appendectomy. Vascular/Lymphatic: The abdominal aorta and IVC are unremarkable. No portal venous gas. No adenopathy. Reproductive: Calcified uterine fibroid. No suspicious adnexal masses. Other: None Musculoskeletal: No acute or significant osseous findings. IMPRESSION: 1. No acute intra-abdominal or pelvic pathology. 2. Moderate colonic stool burden. No bowel obstruction. Electronically Signed   By: Vanetta Chou M.D.   On: 03/25/2024 16:22   IR PICC PLACEMENT RIGHT >5 YRS INC IMG GUIDE Result Date: 03/10/2024 INDICATION: Patient with history of anal cancer; central venous access requested for chemotherapy EXAM: RIGHT UPPER EXTREMITY PICC LINE PLACEMENT WITH ULTRASOUND AND FLUOROSCOPIC GUIDANCE  MEDICATIONS: 3 mL 1% lidocaine  with epinephrine  to skin/subcutaneous tissue ANESTHESIA/SEDATION: None FLUOROSCOPY: Radiation Exposure Index (as provided by the fluoroscopic device): 1 mGy Kerma COMPLICATIONS: None immediate. PROCEDURE: The patient was advised of the possible risks and complications and agreed to undergo the procedure. The patient was then brought to the angiographic suite for the procedure. The right arm was prepped with chlorhexidine, draped in the usual sterile fashion using maximum barrier technique (cap and mask, sterile gown, sterile gloves, large sterile sheet, hand hygiene and cutaneous antisepsis) and infiltrated locally with 1% Lidocaine  with epinephrine . Ultrasound demonstrated patency of the right brachial vein, and this was documented with an image. Under real-time ultrasound guidance, this vein was accessed with a 21 gauge micropuncture needle and image documentation was performed. A 0.018 wire was introduced in to the vein. Over this, a 5 French dual lumen power injectable PICC was advanced to the lower SVC/right atrial junction. Fluoroscopy during the procedure and fluoro spot radiograph confirms appropriate catheter position. The catheter was flushed and covered with a isterile dressing. Catheter length: 34 cm IMPRESSION: Successful right arm power PICC line placement with ultrasound and fluoroscopic guidance. The catheter is ready for use. Performed by: Franky Rakers, PA-C Electronically Signed   By: Marcey Moan M.D.   On: 03/10/2024 09:13    Impression:  The patient is recovering from the effects of radiation with expected moderate-severe dermatitis including wet and dry desquamation, improving with domeboro and silvadene  cream.  Plan:  Continue domeboro and silvadene . Triamcinolone cream for pruritic areas. Topical lidocaine  around anal opening and adjacent perineum. Recommend keeping painful desquamating area as dry as possible. Provided spray bottle to assist with  cleaning. Can apply diaper cream such as Desitin or Boudreaux's to keep area dry and silvadene  on areas of wet desquamation..   Total time spent today in preparation for this visit was 30 minutes. This included patient care, imaging and path review, documentation, multidisciplinary discussion and coordination of care and follow up.    Estefana HERO. Maritza, M.D.

## 2024-04-04 NOTE — Addendum Note (Signed)
 Encounter addended by: Claudene Eudora GAILS, LPN on: 87/11/7972 1:18 PM  Actions taken: Clinical Note Signed, Medication List reviewed, Charge Capture section accepted

## 2024-04-04 NOTE — Progress Notes (Addendum)
 Katrina [name] is here today for follow up post radiation to the pelvic.  The patient returns today for skin check. They completed their radiation on 03/24/2024.     Does the patient complain of any of the following:        Pain : Tenderness and pain in the perineum especially around the anus and vagina.  Diarrhea/Constipation: Constipation, using colace and miralax  Nausea/Vomiting: Denies  Urinary Issues (dysuria/incomplete emptying/ incontinence/ increased frequency/urgency): Yes, pain went from a 10 down to a 2.  Post radiation skin changes: Yes   Additional comments if applicable: Silvadene  has been very helpful on the areas on which they've applied it. Reports new bilateral lower extremity edema.     BP 119/79 (BP Location: Left Arm, Patient Position: Sitting)   Pulse 87   Temp (!) 97.5 F (36.4 C) (Temporal)   Resp 18   Ht 5' 2 (1.575 m)   Wt 123 lb 8 oz (56 kg)   LMP 05/04/2002 (Approximate)   SpO2 97%   BMI 22.59 kg/m

## 2024-04-05 ENCOUNTER — Ambulatory Visit

## 2024-04-06 ENCOUNTER — Ambulatory Visit: Admitting: Physical Therapy

## 2024-04-10 ENCOUNTER — Other Ambulatory Visit (HOSPITAL_COMMUNITY): Payer: Self-pay | Admitting: Pharmacist

## 2024-04-10 ENCOUNTER — Other Ambulatory Visit (HOSPITAL_COMMUNITY): Payer: Self-pay | Admitting: Internal Medicine

## 2024-04-10 ENCOUNTER — Telehealth (HOSPITAL_COMMUNITY): Payer: Self-pay | Admitting: Pharmacy Technician

## 2024-04-10 NOTE — Telephone Encounter (Signed)
 Auth Submission: APPROVED Site of care: [facility] Payer: [employer] Medicare Medication & CPT/J Code(s) submitted: Prolia  (Denosumab ) [id number] Diagnosis Code: M81.0 Route of submission (phone, fax, portal): portal Phone # Fax # Auth type: Buy/Bill HB Units/visits requested: 60mg  x 2 doses, q 6 months Reference number: [id number] Approval from: 03/16/24 to 03/16/25    Dagoberto Armour, CPhT [facility] Phone: [phone] 04/10/2024

## 2024-04-13 ENCOUNTER — Encounter: Admitting: Physical Therapy

## 2024-04-14 ENCOUNTER — Inpatient Hospital Stay: Admitting: Oncology

## 2024-04-14 ENCOUNTER — Inpatient Hospital Stay

## 2024-04-19 ENCOUNTER — Encounter: Payer: Self-pay | Admitting: Oncology

## 2024-04-19 ENCOUNTER — Inpatient Hospital Stay: Admitting: Dietician

## 2024-04-19 ENCOUNTER — Inpatient Hospital Stay: Attending: Oncology

## 2024-04-19 ENCOUNTER — Inpatient Hospital Stay: Admitting: Oncology

## 2024-04-19 VITALS — BP 100/72 | HR 79 | Temp 97.6°F | Resp 17 | Ht 62.0 in | Wt 116.4 lb

## 2024-04-19 DIAGNOSIS — C218 Malignant neoplasm of overlapping sites of rectum, anus and anal canal: Secondary | ICD-10-CM | POA: Diagnosis not present

## 2024-04-19 DIAGNOSIS — M069 Rheumatoid arthritis, unspecified: Secondary | ICD-10-CM | POA: Diagnosis not present

## 2024-04-19 DIAGNOSIS — L598 Other specified disorders of the skin and subcutaneous tissue related to radiation: Secondary | ICD-10-CM | POA: Insufficient documentation

## 2024-04-19 LAB — CBC WITH DIFFERENTIAL (CANCER CENTER ONLY)
Abs Immature Granulocytes: 0.14 K/uL — ABNORMAL HIGH (ref 0.00–0.07)
Basophils Absolute: 0.2 K/uL — ABNORMAL HIGH (ref 0.0–0.1)
Basophils Relative: 2 %
Eosinophils Absolute: 0.2 K/uL (ref 0.0–0.5)
Eosinophils Relative: 3 %
HCT: 38.5 % (ref 36.0–46.0)
Hemoglobin: 12.8 g/dL (ref 12.0–15.0)
Immature Granulocytes: 2 %
Lymphocytes Relative: 23 %
Lymphs Abs: 2.1 K/uL (ref 0.7–4.0)
MCH: 32.1 pg (ref 26.0–34.0)
MCHC: 33.2 g/dL (ref 30.0–36.0)
MCV: 96.5 fL (ref 80.0–100.0)
Monocytes Absolute: 1.7 K/uL — ABNORMAL HIGH (ref 0.1–1.0)
Monocytes Relative: 18 %
Neutro Abs: 4.8 K/uL (ref 1.7–7.7)
Neutrophils Relative %: 52 %
Platelet Count: 423 K/uL — ABNORMAL HIGH (ref 150–400)
RBC: 3.99 MIL/uL (ref 3.87–5.11)
RDW: 16.2 % — ABNORMAL HIGH (ref 11.5–15.5)
WBC Count: 9.2 K/uL (ref 4.0–10.5)
nRBC: 0 % (ref 0.0–0.2)

## 2024-04-19 LAB — CMP (CANCER CENTER ONLY)
ALT: 25 U/L (ref 0–44)
AST: 34 U/L (ref 15–41)
Albumin: 3.9 g/dL (ref 3.5–5.0)
Alkaline Phosphatase: 68 U/L (ref 38–126)
Anion gap: 9 (ref 5–15)
BUN: 8 mg/dL (ref 8–23)
CO2: 28 mmol/L (ref 22–32)
Calcium: 9.7 mg/dL (ref 8.9–10.3)
Chloride: 104 mmol/L (ref 98–111)
Creatinine: 0.7 mg/dL (ref 0.44–1.00)
GFR, Estimated: >60 mL/min (ref >60)
Glucose, Bld: 104 mg/dL — ABNORMAL HIGH (ref 70–99)
Potassium: 3.6 mmol/L (ref 3.5–5.1)
Sodium: 141 mmol/L (ref 135–145)
Total Bilirubin: 0.4 mg/dL (ref 0.0–1.2)
Total Protein: 7.7 g/dL (ref 6.5–8.1)

## 2024-04-19 LAB — MAGNESIUM: Magnesium: 2 mg/dL (ref 1.7–2.4)

## 2024-04-19 NOTE — Progress Notes (Signed)
 Nutrition Follow-up:  Patient with SCC of anal canal. She completed concurrent chemoradiation with mitomycin /5FU. Final RT on 11/21  11/23-11/27 admission with neutropenic fever  Met with patient and husband in office following MD visit. Patient reports today feels like Christmas after receiving good report from Dr. Autumn. Patient reports having no appetite. She is pushing nutrition with encouragement from husband. Eating egg mcmuffin or french toast for breakfast, tomato soup with saltines at lunch. She has tried small amounts of red meat and continues to like [employer]. Patient supplementing with CIB/fairlife milk 1-2 times/day. Patient has weaned off morphine . Says stomach is unsettled as it begins to wake up. Stools are loose. She is not on bowel regimen at this time.   Medications: reviewed   Labs: reviewed   Anthropometrics: Wt 116 lb 6.4 oz today decreased 5.7% in the last 4.5 weeks - significant for time frame  11/14 - 123 lb 12.8 oz 11/7 - 122 lb 9.6 oz 10/17 - 127 lb  10/10 - 129 lb 6.4 oz  9/29 - 127 lb 8 oz    Estimated Energy Needs  Kcals: 1800-2000 Protein: 71-89 Fluid: >/= 2L  NUTRITION DIAGNOSIS: Food and nutrition related knowledge deficit improved    INTERVENTION:  Encourage small frequent meals/snacks with adequate calories and protein to support post treatment healing  Continue CIB with fairlife milk - 2/day Recommend slowly introducing higher fiber foods, trying one food at a time and assess for tolerance - handout provided  Pt to consider appetite stimulant if no improvement in the next couple of weeks per discussion with MD     MONITORING, EVALUATION, GOAL: wt trends, intake   NEXT VISIT: Thursday January 8 via telephone

## 2024-04-19 NOTE — Assessment & Plan Note (Signed)
 Please review oncology history for additional details and timeline of events.  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 Biopsies confirm squamous cell carcinoma.   CT abdomen and pelvis on 01/19/2024 showed irregularity of the rectal wall with an ill-defined 1.5 x 2 cm hypodense lesion, in keeping with known rectal mass. 9 mm lymph node in the posterior pelvis adjacent to the rectosigmoid, concerning for local lymph node involvement.  No evidence of distant metastasis in the abdomen or pelvis.  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    On 02/02/2024, staging PET scan showed intense hypermetabolic activity throughout the anus, consistent with known primary anal carcinoma.  10 mm hypermetabolic left presacral lymph node, consistent with lymph node metastatic disease.  No other sites of metastatic disease was identified.  cT2,cN1,cM0, Stage II B disease.   Plan made to proceed with concurrent chemoradiation using 5-FU/mitomycin  using Nigro protocol.  Treatments began on 02/14/2024.  Tolerated cycle 1 of chemotherapy extremely well without any side effects.  Hence we proceeded with standard dosing for second cycle.  Completed second cycle of chemotherapy on 03/17/2024 and completed radiation treatments on 03/24/2024.   She was admitted to the hospital on 03/26/2024 with complaints of fever.  She was diagnosed with Pseudomonas UTI.  She was found to be pancytopenic at the time of admission.   Started her on Nivestym  300 mcg subcutaneously daily and continued antibiotics.  Patient was discharged home on 03/30/2024.  She has been recovering well from chemoradiation related side effects.  Radiation-induced skin changes are improving.  She continues to use Domeboro and Silvadene  and has made much progress.  Labs today reveal unremarkable CBCD, CMP.   Cytopenias resolved.  Will continue surveillance as per NCCN guidelines.  Will obtain MRI of the abdomen and pelvis and CT of the chest in approximately 3 months and see her in clinic with results.  Following that, she will need imaging annually.  Physical exam/labs will be continued every 3 to 6 months in the first year and then we will slowly space out after that.

## 2024-04-19 NOTE — Progress Notes (Signed)
 [facility]  ONCOLOGY CLINIC PROGRESS NOTE   Patient Care Team: Baxley, Ronal PARAS, MD as PCP - General (Internal Medicine) [name] [name], MD as Consulting Physician (Obstetrics and Gynecology) Nandigam, Kavitha V, MD as Consulting Physician (Gastroenterology) Dewey Rush, MD as Consulting Physician (Radiation Oncology) Autumn Millman, MD as Consulting Physician (Oncology)  PATIENT NAME: Katrina [name]   MR#: [id number] DOB: 25-Dec-1955  Date of visit: 04/19/2024   ASSESSMENT & PLAN:   JOELEE SNOKE is a 68 y.o. 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   Malignant neoplasm of overlapping sites of rectum, anus and anal canal [facility]) Please review oncology history for additional details and timeline of events.  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 Biopsies confirm squamous cell carcinoma.   CT abdomen and pelvis on 01/19/2024 showed irregularity of the rectal wall with an ill-defined 1.5 x 2 cm hypodense lesion, in keeping with known rectal mass. 9 mm lymph node in the posterior pelvis adjacent to the rectosigmoid, concerning for local lymph node involvement.  No evidence of distant metastasis in the abdomen or pelvis.  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    On 02/02/2024, staging PET scan showed intense hypermetabolic activity throughout the anus, consistent with known primary anal carcinoma.  10 mm hypermetabolic left presacral lymph node, consistent with lymph node metastatic disease.  No other sites of metastatic disease was identified.  cT2,cN1,cM0, Stage II B disease.   Plan made to proceed with  concurrent chemoradiation using 5-FU/mitomycin  using Nigro protocol.  Treatments began on 02/14/2024.  Tolerated cycle 1 of chemotherapy extremely well without any side effects.  Hence we proceeded with standard dosing for second cycle.  Completed second cycle of chemotherapy on 03/17/2024 and completed radiation treatments on 03/24/2024.   She was admitted to the hospital on 03/26/2024 with complaints of fever.  She was diagnosed with Pseudomonas UTI.  She was found to be pancytopenic at the time of admission.   Started her on Nivestym  300 mcg subcutaneously daily and continued antibiotics.  Patient was discharged home on 03/30/2024.  She has been recovering well from chemoradiation related side effects.  Radiation-induced skin changes are improving.  She continues to use Domeboro and Silvadene  and has made much progress.  Labs today reveal unremarkable CBCD, CMP.  Cytopenias resolved.  Will continue surveillance as per NCCN guidelines.  Will obtain MRI of the abdomen and pelvis and CT of the chest in approximately 3 months and see her in clinic with results.  Following that, she will need imaging annually.  Physical exam/labs will be continued every 3 to 6 months in the first year and then we will slowly space out after that.  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 She is now able to sit comfortably. Full healing is anticipated, but any residual raw or unhealed areas require monitoring, especially before resuming activities that may traumatize the area. - Continue Domeboro compresses and Silvadene  cream as needed until complete healing. - Monitor for any raw or unhealed areas, particularly prior to activities such as swimming or scuba diving. -  Advised avoidance of activities that may traumatize the area until full healing is confirmed at next follow-up.  History of  chemotherapy-induced pancytopenia, resolved She experienced severe pancytopenia during treatment (WBC nadir 300, platelets 11,000, hemoglobin 7.6), but all blood counts have normalized. No evidence of ongoing cytopenias. Chemistries, liver, and renal function are normal. - Continue routine monitoring of blood counts as part of surveillance.  Cancer-related anorexia She continues to have decreased appetite, likely multifactorial due to recent cessation of morphine  and lingering effects of chemotherapy. She reports mild gastrointestinal upset without alarming symptoms. Gradual improvement is expected. She prefers to defer appetite stimulants at this time. - Monitor appetite and gastrointestinal symptoms. - Offer prescription for appetite stimulant (mannose) if appetite does not improve; she prefers to reassess in 1-2 weeks. - Advised that appetite should improve as effects of chemotherapy and morphine  withdrawal resolve. - Coordinate with supportive care Albino Ina) for additional management as needed.  Rheumatoid arthritis, stable off therapy during cancer treatment No new joint issues reported - Since she completed chemoradiation, she is okay to resume RA medications from our standpoint   I reviewed lab results and outside records for this visit and discussed relevant results with the patient. Diagnosis, plan of care and treatment options were also discussed in detail with the patient. Opportunity provided to ask questions and answers provided to her apparent satisfaction. Provided instructions to call our clinic with any problems, questions or concerns prior to return visit. I recommended to continue follow-up with PCP and sub-specialists. She verbalized understanding and agreed with the plan.   NCCN guidelines have been consulted in the planning of this patients care.  I spent a total of 42 minutes during this encounter with the patient including review of chart and various tests  results, discussions about plan of care and coordination of care plan.  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 Treatments began on 02/14/2024. Completed second cycle of chemotherapy on 03/17/2024 and completed radiation treatments on 03/24/2024.   INTERVAL HISTORY:    Discussed the use of AI scribe software for clinical note transcription with the patient, who gave verbal consent to proceed.  History of Present Illness Seirra Kos is a 67 year old female with anal/rectal cancer, status post chemoradiation, who presents for post-treatment surveillance and recovery assessment.  She completed chemoradiation for anal/rectal cancer on March 24, 2024, and was hospitalized on November 24 for severe chemotherapy-induced pancytopenia and infection, with nadir platelet count of 11,000, white blood cell count of 300, and hemoglobin of 7.6. Since discharge, her blood counts have returned to normal (platelets 423,000, WBC 9,200, hemoglobin 12.8). She reports significant improvement in overall well-being and energy, and is now able to sit comfortably for the first time in six months.  She continues to recover from radiation dermatitis in the treated area, using Domeboro compresses and Silvadene  cream with caregiver assistance. She finds compresses more effective and tolerable than sitz baths. She reports that the treated area is much better, with only a couple of small areas remaining. She is able to sit comfortably, which was previously limited by pain from the tumor and radiation  effects.  She previously required morphine  15 mg up to  three times daily for pain control, but has tapered off opioids over the past two weeks, with her last dose on December 14. She reports that she is fine with the pain and has not needed morphine  since December 14. She notes mild gastrointestinal upset but denies hematochezia or melena. Stools are darker than before but otherwise normal. She is not taking iron supplements.  She continues to experience poor appetite, which she attributes to recent opioid discontinuation and prior chemotherapy. She is hopeful for improvement in the coming weeks.  She has not left the house or driven since her hospitalization, citing low energy and prior opioid use. This visit is her first outing since discharge. She had COVID-19 infection in August and has not yet received a COVID-19 vaccine since then.     I have reviewed the past medical history, past surgical history, social history and family history with the patient and they are unchanged from previous note.  HISTORY OF PRESENT ILLNESS:   ONCOLOGY HISTORY:   Patient presented to her PCP on 11/18/2023 with complaints of rectal itching for couple of weeks and a sensation of incomplete bowel movement, associated with occasional bleeding during/after a bowel movement.  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  Otherwise unremarkable colonoscopy.  On rectal exam in the clinic, similar findings were noted with nonbleeding internal hemorrhoids with additional findings of perirectal erythema and internal erythema but no bright red blood was visualized.  Prescription sent for Proctofoam-HC twice daily.  Apparently she did not get benefit from this medication.   She was referred to GI for further evaluation.   She had consultation with Dr. Federico on 01/11/2024.  She reported that she has been noticing bleeding with every bowel movement.  Also reported some stool  leakage on occasion.  On physical exam in the clinic, palpation of the inside of the anus revealed what felt like a posterior anal fissure and significant surrounding induration in that area.  Anoscopy revealed friable indurated mucosa, internal hemorrhoids and was limited by patient discomfort.  Recommendation made for urgent colonoscopy.   On 01/18/2024, colonoscopy was performed by Dr. Shila. An infiltrative and ulcerated partially obstructing mass was found in the distal rectum.  The mass was partially circumferential (involving two thirds of the lumen circumference).  The mass measured 4 cm in length.  Oozing was present.  This was biopsied with a cold forceps for histology.  Exam was otherwise normal throughout the examined colon.   Pathology from the distal rectal lesion came back positive for invasive moderately differentiated squamous cell carcinoma.  Immunostains were not obtained.   CT abdomen and pelvis on 01/19/2024 showed irregularity of the rectal wall with an ill-defined 1.5 x 2 cm hypodense lesion, in keeping with known rectal mass. 9 mm lymph node in the posterior pelvis adjacent to the rectosigmoid, concerning for local lymph node involvement.  No evidence of distant metastasis in the abdomen or pelvis.   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   On 02/02/2024, staging PET scan showed intense hypermetabolic activity throughout the anus, consistent with known primary anal carcinoma.  10 mm hypermetabolic left presacral lymph node, consistent with lymph node metastatic disease.  No other sites of metastatic disease was identified.  cT2,cN1,cM0, Stage II B disease.  Plan made to proceed with concurrent chemoradiation using 5-FU/mitomycin  using Nigro protocol.  Treatments began on 02/14/2024. Completed second cycle of chemotherapy on 03/17/2024 and completed radiation treatments on 03/24/2024.   She  was admitted to the hospital on 03/26/2024 with complaints of fever.  She was diagnosed with Pseudomonas UTI.  She was found to be pancytopenic at the time of admission.   Started her on Nivestym  300 mcg subcutaneously daily and continued antibiotics.  Patient was discharged home on 03/30/2024.  Cytopenias resolved as of 04/19/2024.  Continues to heal well from radiation-induced skin changes.  Oncology History  Malignant neoplasm of overlapping sites of rectum, anus and anal canal (HCC)  01/27/2024 Initial Diagnosis   Primary squamous cell carcinoma of anal canal   01/27/2024 Cancer Staging   Staging form: Anus, AJCC V9 - Clinical: Stage IIB (cT2, cN1a, cM0) - Signed by Autumn Millman, MD on 01/27/2024 Method of lymph node assessment: Clinical   02/14/2024 - 03/17/2024 Chemotherapy   Patient is on Treatment Plan : ANUS Mitomycin  D1,28 + 5FU D1-4, 28-31 q32d         REVIEW OF SYSTEMS:   Review of Systems - Oncology  All other pertinent systems were reviewed with the patient and are negative.  ALLERGIES: She has no known allergies.  MEDICATIONS:  Current Outpatient Medications  Medication Sig Dispense Refill   aluminum  sulfate-calcium  acetate (DOMEBORO) packet Apply 1 packet topically 3 (three) times daily. (Patient taking differently: Apply 1 packet topically daily at 12 noon.) 100 each 0   Ascorbic Acid (VITAMIN C) 1000 MG tablet Take 1,000 mg by mouth daily.     calcium  carbonate (OS-CAL) 600 MG TABS Take 600 mg by mouth daily.     cholecalciferol (VITAMIN D ) 1000 UNITS tablet Take 1,000 Units by mouth daily.     clonazePAM  (KLONOPIN ) 0.1 mg/mL SUSP Take 5 mLs (0.5 mg total) by mouth 2 (two) times daily as needed. (Patient taking differently: Take 0.5 mg by mouth 2 (two) times daily as needed (burning mouth).) 300 mL 1   denosumab  (PROLIA ) 60 MG/ML SOSY injection Inject 60 mg into the skin every 6 (six) months.     escitalopram  (LEXAPRO ) 10 MG tablet Take 1 tablet (10 mg total)  by mouth daily. 30 tablet 11   hydrOXYzine  (ATARAX ) 10 MG tablet Take 1 tablet (10 mg total) by mouth 3 (three) times daily as needed for anxiety. 30 tablet 0   LORazepam  (ATIVAN ) 0.5 MG tablet Take 1 tablet (0.5 mg total) by mouth at bedtime. (Patient taking differently: Take 0.5 mg by mouth at bedtime as needed for anxiety, seizure, sedation or sleep.) 15 tablet 0   meloxicam (MOBIC) 15 MG tablet Take 1 tablet by mouth as needed for pain.     polyethylene glycol (MIRALAX  / GLYCOLAX ) 17 g packet Take 17 g by mouth daily.     morphine  (MS CONTIN ) 15 MG 12 hr tablet Take 1 tablet (15 mg total) by mouth every morning. (Patient not taking: Reported on 04/19/2024)     NON FORMULARY Diltiazem 2%/Lidocaine5% compound Apply to rectum 3 times daily for 8 weeks (Patient not taking: Reported on 04/19/2024) 30 g 1   triamcinolone  ointment (KENALOG ) 0.5 % Apply 1 Application topically 2 (two) times daily. (Patient not taking: Reported on 04/19/2024) 45 g 0   No current facility-administered medications for this visit.     VITALS:   Blood pressure 100/72, pulse 79, temperature 97.6 F (36.4 C), temperature source Temporal, resp. rate 17, height  5' 2 (1.575 m), weight 116 lb 6.4 oz (52.8 kg), last menstrual period 05/04/2002, SpO2 98%.  Wt Readings from Last 3 Encounters:  04/19/24 116 lb 6.4 oz (52.8 kg)  04/04/24 123 lb 8 oz (56 kg)  03/26/24 121 lb 11.1 oz (55.2 kg)    Body mass index is 21.29 kg/m.    Onc Performance Status - 04/19/24 1453       ECOG Perf Status   ECOG Perf Status Fully active, able to carry on all pre-disease performance without restriction      KPS SCALE   KPS % SCORE Normal, no compliants, no evidence of disease           PHYSICAL EXAM:   Physical Exam Constitutional:      General: She is not in acute distress.    Appearance: Normal appearance.  HENT:     Head: Normocephalic and atraumatic.  Cardiovascular:     Rate and Rhythm: Normal rate.  Pulmonary:      Effort: Pulmonary effort is normal. No respiratory distress.  Abdominal:     General: There is no distension.  Genitourinary:    Comments: Deferred today Neurological:     General: No focal deficit present.     Mental Status: She is alert and oriented to person, place, and time.  Psychiatric:        Mood and Affect: Mood normal.        Behavior: Behavior normal.       LABORATORY DATA:   I have reviewed the data as listed.  Results for orders placed or performed in visit on 04/19/24  Magnesium   Result Value Ref Range   Magnesium  2.0 1.7 - 2.4 mg/dL  CMP (Cancer Center only)  Result Value Ref Range   Sodium 141 135 - 145 mmol/L   Potassium 3.6 3.5 - 5.1 mmol/L   Chloride 104 98 - 111 mmol/L   CO2 28 22 - 32 mmol/L   Glucose, Bld 104 (H) 70 - 99 mg/dL   BUN 8 8 - 23 mg/dL   Creatinine 9.29 9.55 - 1.00 mg/dL   Calcium  9.7 8.9 - 10.3 mg/dL   Total Protein 7.7 6.5 - 8.1 g/dL   Albumin 3.9 3.5 - 5.0 g/dL   AST 34 15 - 41 U/L   ALT 25 0 - 44 U/L   Alkaline Phosphatase 68 38 - 126 U/L   Total Bilirubin 0.4 0.0 - 1.2 mg/dL   GFR, Estimated >39 >39 mL/min   Anion gap 9 5 - 15  CBC with Differential (Cancer Center Only)  Result Value Ref Range   WBC Count 9.2 4.0 - 10.5 K/uL   RBC 3.99 3.87 - 5.11 MIL/uL   Hemoglobin 12.8 12.0 - 15.0 g/dL   HCT 61.4 63.9 - 53.9 %   MCV 96.5 80.0 - 100.0 fL   MCH 32.1 26.0 - 34.0 pg   MCHC 33.2 30.0 - 36.0 g/dL   RDW 83.7 (H) 88.4 - 84.4 %   Platelet Count 423 (H) 150 - 400 K/uL   nRBC 0.0 0.0 - 0.2 %   Neutrophils Relative % 52 %   Neutro Abs 4.8 1.7 - 7.7 K/uL   Lymphocytes Relative 23 %   Lymphs Abs 2.1 0.7 - 4.0 K/uL   Monocytes Relative 18 %   Monocytes Absolute 1.7 (H) 0.1 - 1.0 K/uL   Eosinophils Relative 3 %   Eosinophils Absolute 0.2 0.0 - 0.5 K/uL   Basophils Relative 2 %  Basophils Absolute 0.2 (H) 0.0 - 0.1 K/uL   Immature Granulocytes 2 %   Abs Immature Granulocytes 0.14 (H) 0.00 - 0.07 K/uL       RADIOGRAPHIC  STUDIES:  I have personally reviewed the radiological images as listed and agree with the findings in the report.  VAS US  LOWER EXTREMITY VENOUS (DVT) Result Date: 04/04/2024  Lower Venous DVT Study Patient Name:  IKRAM RIEBE  Date of Exam:   04/04/2024 Medical Rec #: [id number]        Accession #:    [id number] Date of Birth: [birth year]        Patient Gender: F Patient Age:   85 years Exam Location:  [address] Procedure:      VAS US  LOWER EXTREMITY VENOUS (DVT) Referring Phys: ESTEFANA CHA --------------------------------------------------------------------------------  Indications: Left lower extremity swelling. Patient denies any unusual SOB.  Risk Factors: None identified Cancer history to anus; malignant neoplasm of rectum, anal canal. Comparison Study: NA Performing Technologist: Nanetta Shad RVT  Examination Guidelines: A complete evaluation includes B-mode imaging, spectral Doppler, color Doppler, and power Doppler as needed of all accessible portions of each vessel. Bilateral testing is considered an integral part of a complete examination. Limited examinations for reoccurring indications may be performed as noted. The reflux portion of the exam is performed with the patient in reverse Trendelenburg.  +---------+---------------+---------+-----------+----------+--------------+ RIGHT    CompressibilityPhasicitySpontaneityPropertiesThrombus Aging +---------+---------------+---------+-----------+----------+--------------+ CFV      Full           Yes      Yes                                 +---------+---------------+---------+-----------+----------+--------------+ SFJ      Full                    Yes                                 +---------+---------------+---------+-----------+----------+--------------+ FV Prox  Full                                                        +---------+---------------+---------+-----------+----------+--------------+ FV Mid   Full            Yes      Yes                                 +---------+---------------+---------+-----------+----------+--------------+ FV DistalFull                                                        +---------+---------------+---------+-----------+----------+--------------+ PFV      Full                                                        +---------+---------------+---------+-----------+----------+--------------+ POP  Full           Yes      Yes                                 +---------+---------------+---------+-----------+----------+--------------+ PTV      Full                                                        +---------+---------------+---------+-----------+----------+--------------+ PERO     Full                                                        +---------+---------------+---------+-----------+----------+--------------+ GSV      Full                                                        +---------+---------------+---------+-----------+----------+--------------+   +---------+---------------+---------+-----------+----------+--------------+ LEFT     CompressibilityPhasicitySpontaneityPropertiesThrombus Aging +---------+---------------+---------+-----------+----------+--------------+ CFV      Full           Yes      Yes                                 +---------+---------------+---------+-----------+----------+--------------+ SFJ      Full                    Yes                                 +---------+---------------+---------+-----------+----------+--------------+ FV Prox  Full                                                        +---------+---------------+---------+-----------+----------+--------------+ FV Mid   Full           Yes      Yes                                 +---------+---------------+---------+-----------+----------+--------------+ FV DistalFull                                                         +---------+---------------+---------+-----------+----------+--------------+ PFV      Full                                                        +---------+---------------+---------+-----------+----------+--------------+  POP      Full           Yes      Yes                                 +---------+---------------+---------+-----------+----------+--------------+ PTV      Full                                                        +---------+---------------+---------+-----------+----------+--------------+ PERO     Full                                                        +---------+---------------+---------+-----------+----------+--------------+ GSV      Full                                                        +---------+---------------+---------+-----------+----------+--------------+     Summary: BILATERAL: - No evidence of deep vein thrombosis seen in the lower extremities, bilaterally. - No evidence of superficial venous thrombosis in the lower extremities, bilaterally. -No evidence of popliteal cyst, bilaterally.   *See table(s) above for measurements and observations. Electronically signed by Debby Robertson on 04/04/2024 at 4:17:09 PM.    Final    DG Chest 2 View if patient is not in a treatment room. Result Date: 03/26/2024 EXAM: 2 VIEW(S) XRAY OF THE CHEST 03/26/2024 01:52:50 PM COMPARISON: Chest x-ray dated 11/18/2017. CLINICAL HISTORY: Suspected Sepsis. FINDINGS: LUNGS AND PLEURA: No focal pulmonary opacity. No pleural effusion. No pneumothorax. HEART AND MEDIASTINUM: No acute abnormality of the cardiac and mediastinal silhouettes. BONES AND SOFT TISSUES: No acute osseous abnormality. IMPRESSION: 1. No acute cardiopulmonary process. Electronically signed by: Morgane Naveau MD 03/26/2024 01:56 PM EST RP Workstation: [id number]   CT ABDOMEN PELVIS W CONTRAST Result Date: 03/25/2024 CLINICAL DATA:  Abdominal pain. Constipation. History of he female cancer.  EXAM: CT ABDOMEN AND PELVIS WITH CONTRAST TECHNIQUE: Multidetector CT imaging of the abdomen and pelvis was performed using the standard protocol following bolus administration of intravenous contrast. RADIATION DOSE REDUCTION: This exam was performed according to the departmental dose-optimization program which includes automated exposure control, adjustment of the mA and/or kV according to patient size and/or use of iterative reconstruction technique. CONTRAST:  90mL OMNIPAQUE  IOHEXOL  300 MG/ML  SOLN COMPARISON:  CT abdomen pelvis dated 01/19/2024. FINDINGS: Lower chest: The visualized lung bases are clear. No intra-abdominal free air or free fluid. Hepatobiliary: The liver is unremarkable. No biliary dilatation. The gallbladder is unremarkable Pancreas: Unremarkable. No pancreatic ductal dilatation or surrounding inflammatory changes. Spleen: Normal in size without focal abnormality. Adrenals/Urinary Tract: The adrenal glands, kidneys, visualized ureters, and urinary bladder appear unremarkable. Stomach/Bowel: Evaluation of the bowel is limited in the absence of oral contrast. There is no bowel obstruction or active inflammation. There is moderate stool throughout the colon. Appendectomy. Vascular/Lymphatic: The abdominal aorta and IVC are unremarkable. No portal venous gas. No adenopathy. Reproductive: Calcified uterine fibroid. No  suspicious adnexal masses. Other: None Musculoskeletal: No acute or significant osseous findings. IMPRESSION: 1. No acute intra-abdominal or pelvic pathology. 2. Moderate colonic stool burden. No bowel obstruction. Electronically Signed   By: Vanetta Chou M.D.   On: 03/25/2024 16:22    Orders Placed This Encounter  Procedures   MR Abdomen W Wo Contrast    Standing Status:   Future    Expected Date:   07/05/2024    Expiration Date:   04/19/2025    If indicated for the ordered procedure, I authorize the administration of contrast media per Radiology protocol:   Yes    What  is the patient's sedation requirement?:   No Sedation    Does the patient have a pacemaker or implanted devices?:   No    Preferred imaging location?:   [facility] (table limit - 500lbs)   MR Pelvis W Wo Contrast    Standing Status:   Future    Expected Date:   07/05/2024    Expiration Date:   04/19/2025    If indicated for the ordered procedure, I authorize the administration of contrast media per Radiology protocol:   Yes    What is the patient's sedation requirement?:   No Sedation    Does the patient have a pacemaker or implanted devices?:   No    Preferred imaging location?:   [facility] (table limit - 500lbs)   CT Chest Wo Contrast    Standing Status:   Future    Expected Date:   07/05/2024    Expiration Date:   04/19/2025    Preferred imaging location?:   [facility]     Future Appointments  Date Time Provider Department Center  04/20/2024  2:00 PM Helmus, Jitka, PT [facility] None  04/24/2024  2:00 PM Helmus, Jitka, PT [facility] None  05/01/2024  1:45 PM [facility] None  05/08/2024  2:00 PM Helmus, Jitka, PT [facility] None  05/10/2024  1:00 PM Maritza Stagger, MD [facility] None  05/11/2024 10:30 AM Ivonne Harlene RAMAN, RD [facility] None  06/08/2024  9:10 AM [facility] [address]  06/12/2024  3:00 PM Baxley, Ronal PARAS, MD [facility] [address]     This document was completed utilizing speech recognition software. Grammatical errors, random word insertions, pronoun errors, and incomplete sentences are an occasional consequence of this system due to software limitations, ambient noise, and hardware issues. Any formal questions or concerns about the content, text or information contained within the body of this dictation should be directly addressed to the provider for clarification.

## 2024-04-20 ENCOUNTER — Encounter: Payer: Self-pay | Admitting: Physical Therapy

## 2024-04-20 ENCOUNTER — Ambulatory Visit: Admitting: Physical Therapy

## 2024-04-20 DIAGNOSIS — R278 Other lack of coordination: Secondary | ICD-10-CM | POA: Insufficient documentation

## 2024-04-20 DIAGNOSIS — R252 Cramp and spasm: Secondary | ICD-10-CM | POA: Insufficient documentation

## 2024-04-20 NOTE — Therapy (Signed)
 OUTPATIENT PHYSICAL THERAPY FEMALE PELVIC TREATMENT   Patient Name: Katrina [name] MRN: [id number] DOB:1956/03/06, 68 y.o., female Today's Date: 04/20/2024  END OF SESSION:  PT End of Session - 04/20/24 1403     Visit Number 2    Date for Recertification  05/02/24    Authorization Type aetna [id number]  no auth req  medical necessity    Progress Note Due on Visit 10    PT Start Time 1400    PT Stop Time 1440    PT Time Calculation (min) 40 min    Activity Tolerance Patient tolerated treatment well;Patient limited by pain    Behavior During Therapy [facility] for tasks assessed/performed           Past Medical History:  Diagnosis Date   Arthritis    --rheumatoid   Colon polyp    Osteoporosis 04/05/14   T score -2.6 spine   Salpingitis    Had appendectomy at same time as salpingitis dx was made.   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, anus and anal canal (HCC) 01/27/2024   Situational insomnia 01/27/2024   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   Rectal bleeding 11/28/2023   Age-related osteoporosis without current pathological fracture 03/26/2023   Osteopenia 08/23/2022   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  03/15/2018   Chronic cough 01/27/2018   Rheumatoid arthritis (HCC) 04/05/2011    PCP: [name], MD  REFERRING PROVIDER: Autumn Millman, MD  REFERRING  DIAG: [phone] (ICD-10-CM) - Primary squamous cell carcinoma of anal canal  THERAPY DIAG:  Cramp and spasm  Other lack of coordination  Rationale for Evaluation and Treatment: Rehabilitation  ONSET DATE: July 2025  SUBJECTIVE:                                                                                                                                                                                           SUBJECTIVE STATEMENT: Patient reports that she completed her treatment and it was tough. She had  30 (6 weeks) radiation sessions. Chemo as well- 2 rounds.   Her blood count dropped to 300, had to stay 4 nights in the hospital, had UTI as well, very painful She feels ok now.  She was on opioids for a month, she came off of them on Monday Sometimes she gets upset stomach Patient has no pain now, anal area is tender Not constipated Feels tired overall, does not have a lot of energy, maybe 30-40% of her energy Still wearing depends, passed gas yesterday and had an accident.  Not sure what to expect from PT now, wants to get back to normal movement, activity, yoga She is not sure what will happen with her BM Used to be able to walk 3 miles, can do 20 mins now due to cold Was able to do 1 yoga class, wants to be able to do that again Has not been doing any shopping running errands, was not sure if it safe for her to do Until Monday she was not able to drive due to taking morphine  Is so happy to be here Lost about 15 lbs   eval Patient recently diagnosed with anal cancer. Patient reports that she has pain in the anal canal.  She awaiting radiation treatment Tx will be 28 sessions, 5 weeks.  Has a tumor that is obstructing the canal, takes miralax  daily Sitting is uncomfortable. She has been walking. Used to do yoga and swim but not since the diagnosis. She is nervous about the upcoming treatment.  Fluid intake: water  FUNCTIONAL LIMITATIONS: pretty healthy  PERTINENT  HISTORY:  Medications for current condition: Mobic Surgeries: no Other: - Sexual abuse: No  DIAGNOSTIC FINDINGS:  Post-void residual: Voiding Cystourethrogram (VCUG):  Ultrasound: PAIN:  Are you having pain? yes NPRS scale: 4-7/10 Pain location: Internal  Pain type: aching, burning, and sharp Pain description: intermittent   Aggravating factors: not Relieving factors: Meloxicam  PRECAUTIONS: None  RED FLAGS: None   WEIGHT BEARING RESTRICTIONS: No  FALLS:  Has patient fallen in last 6 months? No  OCCUPATION: retired  ACTIVITY LEVEL : pretty active, walks every day, used to do yoga, swims  PLOF: Independent  PATIENT GOALS: does not have any now   BOWEL MOVEMENT: Pain with bowel movement: Yes Type of bowel movement:Type (Bristol Stool Scale) 4, Frequency 3-5 times, Strain no, and Splinting no Fully empty rectum: no Leakage: some                                                   Pads: No Fiber supplement/laxative Yes miralax   URINATION:no issues  INTERCOURSE: not active   PREGNANCY:no   PROLAPSE: None   OBJECTIVE:  Note: Objective measures were completed at Evaluation unless otherwise noted.    PATIENT SURVEYS:    PFIQ-7: 68  COGNITION: Overall cognitive status: Within functional limits for tasks assessed     SENSATION: Light touch: Appears intact  LUMBAR SPECIAL TESTS:  Slump test: Negative  FUNCTIONAL TESTS:  5 times sit to stand: 7 Single leg stance: mild valgus bilat   Sit-up test:1/3  Squat:within functional limitations  Bed mobility:within functional limitations   GAIT: Assistive device utilized: None Comments: within functional limitations   POSTURE: No Significant postural limitations   LUMBARAROM/PROM:  A/PROM A/PROM  Eval (% available)  Flexion 90  Extension 90  Right lateral flexion 90  Left lateral flexion 90  Right rotation 90  Left rotation 90   (Blank rows = not tested)  LOWER EXTREMITY ROM: within  functional limitations    LOWER EXTREMITY MMT:5/5   PALPATION:  General: tight and tender external and internal anal sphincter  Pelvic Alignment: even  Abdominal: able to dem 360 breathing with relaxed shoulders  Diastasis: No Distortion: No  Breathing: able to dem 360 breathing with relaxed shoulders Scar tissue: no                External Perineal Exam: within functional limitations                              Internal Pelvic Floor: tight and tender external and internal sphincters  Patient confirms identification and approves PT to assess internal pelvic floor and treatment No  PELVIC MMT:   MMT eval  Vaginal   Internal Anal Sphincter 5/5  External Anal Sphincter 5/5  Puborectalis   Diastasis Recti no  (Blank rows = not tested)        TONE: high  PROLAPSE: no  TODAY'S TREATMENT:                                                                                                                              DATE:  04/30/2024 Nu Step for 15 minutes, level 5, bilateral upper and lower extremities, therapist present to discuss progress  5 sit to stand 7:30 Education on potential use of rectal dilators  Bristol stool scale 4-6 now, not using miralax      02/01/2024  EVAL  Examination completed, findings reviewed, pt educated on POC, HEP, and female pelvic floor anatomy, reasoning with pelvic floor assessment internally with pt consent. Pt motivated to participate in PT and agreeable to attempt recommendations.     PATIENT EDUCATION/ there acts:   Education details: Pt was educated on relevant anatomy, exam findings, home exercise program, plan of care, expectations of PT and healthy bowel PT recommendations  Person educated: Patient Education method: Explanation, Demonstration, Tactile cues, Verbal cues, and Handouts Education comprehension: verbalized understanding, returned demonstration, verbal cues required, tactile cues required, and needs further  education  HOME EXERCISE PROGRAM: Access Code: [email or web address] URL: [email or web address] Date: 02/01/2024 Prepared by: Cori Darene Nappi  Exercises - Cat Cow  - 1 x daily - 7 x weekly - 2 sets - 10 reps - Supine Figure 4 Piriformis Stretch  - 1 x daily - 7 x weekly - 2 sets - 10 reps - Child's Pose Stretch  - 1 x daily - 7 x weekly - 2 sets - 10 reps - Supine Diaphragmatic Breathing with Pelvic Floor Lengthening  - 1 x daily - 7 x weekly - 2 sets - 10 reps - Pelvic Floor Lengthening in Hooklying  - 1 x daily - 7 x weekly - 2 sets - 10 reps  Patient Education - Get To  Know Your Pelvic Floor- Female - High-Fiber Diet to Support Pelvic Health - Abdominal Massage for Constipation - Abdominal Massage for Constipation  ASSESSMENT:  CLINICAL IMPRESSION: Patient returning to PT after treatment for anal cancer. She has lost a lot of weight, has reduced endurance and strength, her goals include to get back to yoga, walking and swimming. She has bristol stool scale 4-6 now, recently had a fecal accident. 5 sit to stand 7,3. Bilateral lower extremity strength is 4+/5 Did well with her exercises today, will continue to benefit from PT to recover from cancer treatment.       From eval Patient is a 68 y.o. F who was seen today for physical therapy evaluation and treatment for physical therapy assessment prior to radiation for anal cancer. Patient with tight and tender internal and external anal sphincter and high tone pelvic floor. Decreased coordination, likely due to guarding and fear of having a fecal accident. Findings notable for good trunk flexibility and diaphragmatic breathing.   OBJECTIVE IMPAIRMENTS: decreased coordination, decreased ROM, impaired tone, and pain.   ACTIVITY LIMITATIONS: sitting, continence, and toileting  PARTICIPATION LIMITATIONS: community activity  PERSONAL FACTORS: Fitness and 1 comorbidity: anal cancer are also affecting patient's functional outcome.    REHAB POTENTIAL: Good  CLINICAL DECISION MAKING: Evolving/moderate complexity  EVALUATION COMPLEXITY: Moderate   GOALS: Goals reviewed with patient? Yes  SHORT TERM GOALS: Target date: 02/29/2024    Pt will be independent with HEP.   Baseline: Goal status: INITIAL  2.  Pt will be independent with use of squatty potty, relaxed toileting mechanics, and improved bowel movement techniques in order to increase ease of bowel movements and complete evacuation.   Baseline:  Goal status: INITIAL  3.  Patient will be educated on rectal dilators Baseline:  Goal status: INITIAL  4.  Patient will soak 0 pads/ day Baseline:  Goal status: INITIAL   LONG TERM GOALS: Target date: 04/25/2024    Pt will be independent with advanced HEP.   Baseline:  Goal status: INITIAL  2.  Patient will be I with use of dilators  Baseline:  Goal status: INITIAL  3.  Patient will have regular bowel movements, bristol stool scale type 3-4 Baseline:  Goal status: INITIAL  4.  Patient will participate in 40 minute PT session without increased pain Baseline:  Goal status: INITIAL  5.  Patient will be able to sit as long as needed- at least 1 hr- without increased pain Baseline:  Goal status: INITIAL  6.  Patient will report no fecal accidents/ day Baseline:  Goal status: INITIAL  PLAN:  PT FREQUENCY: 1-2x/week  PT DURATION: 12 weeks  PLANNED INTERVENTIONS: 97110-Therapeutic exercises, 97530- Therapeutic activity, 97112- Neuromuscular re-education, 97535- Self Care, [id number]- Manual therapy, [id number]- Gait training, [phone] (1-2 muscles), [id number] (3+ muscles)- Dry Needling, Patient/Family education, Balance training, Taping, Joint mobilization, Joint manipulation, Spinal manipulation, Spinal mobilization, Manual lymph drainage, Scar mobilization, Cryotherapy, Moist heat, and Biofeedback  PLAN FOR NEXT SESSION: reassessment after radiation, educate patient on dilators, continue exercises for down  training/ up training as needed   Kamerin Grumbine, PT 04/20/2024, 2:04 PM

## 2024-04-23 NOTE — Therapy (Signed)
 " OUTPATIENT PHYSICAL THERAPY FEMALE PELVIC TREATMENT/ POC update   Patient Name: Katrina [name] MRN: [id number] DOB:09-19-1955, 68 y.o., female Today's Date: 04/24/2024  END OF SESSION:  PT End of Session - 04/24/24 1400     Visit Number 3    Date for Recertification  05/02/24    Authorization Type aetna mcr [id number]  no auth req  medical necessity    Progress Note Due on Visit 10    PT Start Time 1400    PT Stop Time 1440    PT Time Calculation (min) 40 min    Activity Tolerance Patient tolerated treatment well;Patient limited by pain    Behavior During Therapy [facility] for tasks assessed/performed            Past Medical History:  Diagnosis Date   Arthritis    --rheumatoid   Colon polyp    Osteoporosis 04/05/14   T score -2.6 spine   Salpingitis    Had appendectomy at same time as salpingitis dx was made.   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, anus and anal canal (HCC) 01/27/2024   Situational insomnia 01/27/2024   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   Rectal bleeding 11/28/2023   Age-related osteoporosis without current pathological fracture 03/26/2023   Osteopenia 08/23/2022   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  03/15/2018   Chronic cough 01/27/2018   Rheumatoid arthritis (HCC) 04/05/2011    PCP: [name], MD  REFERRING PROVIDER: Autumn Millman,  MD  REFERRING DIAG: [phone] (ICD-10-CM) - Primary squamous cell carcinoma of anal canal  THERAPY DIAG:  Cramp and spasm  Other lack of coordination  Rationale for Evaluation and Treatment: Rehabilitation  ONSET DATE: July 2025  SUBJECTIVE:                                                                                                                                                                                           SUBJECTIVE STATEMENT: Patient reports that she feels like she is coming  back to life Slept in boxer drawers, feels like there is some seepage.  Has more energy, drove here.  Gi still adjusting Was able to cook and appetite is better, adding new foods in, system is adjusting Was on low fiber diet for month      Last session Patient reports that she completed her treatment and it was tough. She had 30 (6 weeks) radiation sessions. Chemo as well- 2 rounds.   Her blood count dropped to 300, had to stay 4 nights in the hospital, had UTI as well, very painful She feels ok now.  She was on opioids for a month, she came off of them on Monday Sometimes she gets upset stomach Patient has no pain now, anal area is tender Not constipated Feels tired overall, does not have a lot of energy, maybe 30-40% of her energy Still wearing depends, passed gas yesterday and had an accident.  Not sure what to expect from PT now, wants to get back to normal movement, activity, yoga She is not sure what will happen with her BM Used to be able to walk 3 miles, can do 20 mins now due to cold Was able to do 1 yoga class, wants to be able to do that again Has not been doing any shopping running errands, was not sure if it safe for her to do Until Monday she was not able to drive due to taking morphine  Is so happy to be here Lost about 15 lbs   eval Patient recently diagnosed with anal cancer. Patient reports that she has pain in the anal canal.  She awaiting radiation  treatment Tx will be 28 sessions, 5 weeks.  Has a tumor that is obstructing the canal, takes miralax  daily Sitting is uncomfortable. She has been walking. Used to do yoga and swim but not since the diagnosis. She is nervous about the upcoming treatment.  Fluid intake: water  FUNCTIONAL LIMITATIONS: pretty healthy  PERTINENT HISTORY:  Medications for current condition: Mobic Surgeries: no Other: - Sexual abuse: No  DIAGNOSTIC FINDINGS:  Post-void residual: Voiding Cystourethrogram (VCUG):  Ultrasound: PAIN:  Are you having pain? yes NPRS scale: 4-7/10 Pain location: Internal  Pain type: aching, burning, and sharp Pain description: intermittent   Aggravating factors: not Relieving factors: Meloxicam  PRECAUTIONS: None  RED FLAGS: None   WEIGHT BEARING RESTRICTIONS: No  FALLS:  Has patient fallen in last 6 months? No  OCCUPATION: retired  ACTIVITY LEVEL : pretty active, walks every day, used to do yoga, swims  PLOF: Independent  PATIENT GOALS: does not have any now   BOWEL MOVEMENT: Pain with bowel movement: Yes Type of bowel movement:Type (Bristol Stool Scale) 4, Frequency 3-5 times, Strain no, and Splinting no Fully empty rectum: no Leakage: some                                                   Pads: No Fiber supplement/laxative Yes miralax   URINATION:no issues  INTERCOURSE: not active   PREGNANCY:no   PROLAPSE: None   OBJECTIVE:  Note: Objective measures were completed at Evaluation unless otherwise noted.    PATIENT SURVEYS:    PFIQ-7: 8  COGNITION: Overall cognitive status: Within functional limits for tasks assessed     SENSATION: Light touch: Appears intact  LUMBAR SPECIAL TESTS:  Slump test: Negative  FUNCTIONAL TESTS:  5 times sit to stand: 7 Single leg stance: mild valgus bilat   Sit-up test:1/3  Squat:within functional limitations  Bed mobility:within functional limitations   GAIT: Assistive device utilized:  None Comments: within functional limitations   POSTURE: No Significant postural limitations   LUMBARAROM/PROM:  A/PROM A/PROM  Eval (% available)  Flexion 90  Extension 90  Right lateral flexion 90  Left lateral flexion 90  Right rotation 90  Left rotation 90   (Blank rows = not tested)  LOWER EXTREMITY ROM: within functional limitations    LOWER EXTREMITY MMT:5/5   PALPATION:  General: tight and tender external and internal anal sphincter  Pelvic Alignment: even  Abdominal: able to dem 360 breathing with relaxed shoulders  Diastasis: No Distortion: No  Breathing: able to dem 360 breathing with relaxed shoulders Scar tissue: no                External Perineal Exam: within functional limitations                              Internal Pelvic Floor: tight and tender external and internal sphincters  Patient confirms identification and approves PT to assess internal pelvic floor and treatment No  PELVIC MMT:   MMT eval  Vaginal   Internal Anal Sphincter 5/5  External Anal Sphincter 5/5  Puborectalis   Diastasis Recti no  (Blank rows = not tested)        TONE: high  PROLAPSE: no  TODAY'S TREATMENT:                                                                                                                              DATE:  04/24/2024 Nu Step for 12 minutes, level 5, bilateral upper and lower extremities, therapist present to discuss progress  Hip abduction machine 10 reps X 4 reps Leg press with exhale 55 lbs 30 reps Using zipper metaphor from yoga and breath to expand and contract pelvic floor Farmers' carry 2 laps #10 and #5 Lunge stretch with 2 blocks 2 mins bilat Diaphragmatic breathing in child's pose 2 reps   04/20/2024 Nu Step for 15 minutes, level 5, bilateral upper and lower extremities, therapist present to discuss progress  5 sit to stand 7:30 Education on potential use of rectal dilators  Bristol stool scale 4-6 now, not using  miralax      02/01/2024  EVAL  Examination completed, findings reviewed, pt educated on POC, HEP, and female pelvic floor anatomy, reasoning with pelvic floor assessment internally with pt consent. Pt motivated to participate in PT and agreeable to attempt recommendations.     PATIENT EDUCATION/ there acts:   Education details: Pt was educated on relevant anatomy, exam findings, home exercise program, plan of care, expectations of PT and healthy bowel PT recommendations  Person educated: Patient Education method: Explanation, Demonstration, Tactile cues, Verbal cues, and Handouts Education comprehension: verbalized understanding,  returned demonstration, verbal cues required, tactile cues required, and needs further education  HOME EXERCISE PROGRAM: Access Code: [email or web address] URL: [email or web address] Date: 02/01/2024 Prepared by: Cori Niall Illes  Exercises - Cat Cow  - 1 x daily - 7 x weekly - 2 sets - 10 reps - Supine Figure 4 Piriformis Stretch  - 1 x daily - 7 x weekly - 2 sets - 10 reps - Child's Pose Stretch  - 1 x daily - 7 x weekly - 2 sets - 10 reps - Supine Diaphragmatic Breathing with Pelvic Floor Lengthening  - 1 x daily - 7 x weekly - 2 sets - 10 reps - Pelvic Floor Lengthening in Hooklying  - 1 x daily - 7 x weekly - 2 sets - 10 reps  Patient Education - Get To Know Your Pelvic Floor- Female - High-Fiber Diet to Support Pelvic Health - Abdominal Massage for Constipation - Abdominal Massage for Constipation  ASSESSMENT:  CLINICAL IMPRESSION: Patient returning to PT after treatment for anal cancer. She has lost a lot of weight, has reduced endurance and strength, her goals include to get back to yoga, walking and swimming. She has bristol stool scale 4-6 now, returning to walking and had more energy today. Did well with her exercises today. Bilateral lower extremity strength is 4+/5 Did well with her exercises today, will continue to benefit from PT to recover  from cancer treatment. Recert finished 04/24/2024       From eval Patient is a 68 y.o. F who was seen today for physical therapy evaluation and treatment for physical therapy assessment prior to radiation for anal cancer. Patient with tight and tender internal and external anal sphincter and high tone pelvic floor. Decreased coordination, likely due to guarding and fear of having a fecal accident. Findings notable for good trunk flexibility and diaphragmatic breathing.   OBJECTIVE IMPAIRMENTS: decreased coordination, decreased ROM, impaired tone, and pain.   ACTIVITY LIMITATIONS: sitting, continence, and toileting  PARTICIPATION LIMITATIONS: community activity  PERSONAL FACTORS: Fitness and 1 comorbidity: anal cancer are also affecting patient's functional outcome.   REHAB POTENTIAL: Good  CLINICAL DECISION MAKING: Evolving/moderate complexity  EVALUATION COMPLEXITY: Moderate   GOALS: Goals reviewed with patient? Yes  SHORT TERM GOALS: Target date: 02/29/2024    Pt will be independent with HEP.   Baseline: Goal status: ongoing  2.  Pt will be independent with use of squatty potty, relaxed toileting mechanics, and improved bowel movement techniques in order to increase ease of bowel movements and complete evacuation.   Baseline:  Goal status: INITIAL  3.  Patient will be educated on rectal dilators Baseline:  Goal status: INITIAL  4.  Patient will soak 0 pads/ day Baseline:  Goal status: INITIAL   LONG TERM GOALS: Target date: 04/25/2024 updated to 07/25/2023    Pt will be independent with advanced HEP.   Baseline:  Goal status: initial  2.  Patient will be I with use of dilators  Baseline:  Goal status: INITIAL  3.  Patient will have regular bowel movements, bristol stool scale type 3-4 Baseline: 7 Goal status: INITIAL  4.  Patient will participate in 40 minute PT session without increased pain Baseline:  Goal status: ongoing (endurance is limiting her  post treatment now 04/2024)  5.  Patient will be able to sit as long as needed- at least 1 hr- without increased pain Baseline:  Goal status: ongoing  6.  Patient will report no fecal accidents/ day Baseline:  Goal status: ongoing  PLAN:  PT FREQUENCY: 1-2x/week  PT DURATION: 12 weeks  PLANNED INTERVENTIONS: 97110-Therapeutic exercises, 97530- Therapeutic activity, 97112- Neuromuscular re-education, 97535- Self Care, [id number]- Manual therapy, [phone]- Gait training, [phone] (1-2 muscles), [phone] (3+ muscles)- Dry Needling, Patient/Family education, Balance training, Taping, Joint mobilization, Joint manipulation, Spinal manipulation, Spinal mobilization, Manual lymph drainage, Scar mobilization, Cryotherapy, Moist heat, and Biofeedback  PLAN FOR NEXT SESSION: reassessment after radiation, educate patient on dilators, continue exercises for down training/ up training as needed   Meckenzie Balsley, PT 04/24/2024, 2:01 PM  "

## 2024-04-24 ENCOUNTER — Encounter: Payer: Self-pay | Admitting: Physical Therapy

## 2024-04-24 ENCOUNTER — Ambulatory Visit: Admitting: Physical Therapy

## 2024-04-24 DIAGNOSIS — R252 Cramp and spasm: Secondary | ICD-10-CM | POA: Diagnosis not present

## 2024-04-24 DIAGNOSIS — R278 Other lack of coordination: Secondary | ICD-10-CM

## 2024-05-01 ENCOUNTER — Ambulatory Visit (HOSPITAL_COMMUNITY)
Admission: RE | Admit: 2024-05-01 | Discharge: 2024-05-01 | Disposition: A | Source: Ambulatory Visit | Attending: Internal Medicine

## 2024-05-01 ENCOUNTER — Ambulatory Visit

## 2024-05-01 VITALS — BP 96/68 | HR 86 | Temp 97.2°F | Resp 16

## 2024-05-01 DIAGNOSIS — M810 Age-related osteoporosis without current pathological fracture: Secondary | ICD-10-CM | POA: Diagnosis present

## 2024-05-01 MED ORDER — DENOSUMAB 60 MG/ML ~~LOC~~ SOSY
PREFILLED_SYRINGE | SUBCUTANEOUS | Status: AC
  Filled 2024-05-01: qty 1

## 2024-05-01 MED ORDER — DENOSUMAB 60 MG/ML ~~LOC~~ SOSY
60.0000 mg | PREFILLED_SYRINGE | Freq: Once | SUBCUTANEOUS | Status: AC
  Administered 2024-05-01: 60 mg via SUBCUTANEOUS

## 2024-05-02 ENCOUNTER — Encounter (HOSPITAL_COMMUNITY)

## 2024-05-06 NOTE — Progress Notes (Signed)
 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  After completion of radiation she presented to the ER with sx related to opioid induced constipation and discharged home on a different bowel regimen. She presented to the ER again in setting of neutropenic fever secondary to Pseudomonas UTI. She was hospitalized and treated with antibiotics. She was discharged on Thanksgiving. She was seen in our office for a skin check in early December at which time she was noted to have moderate dermatitis and wet desquamation of the perineum, improving with an involved skin care regimen of domeboro soaks, silvadene  cream, triamcinolone , desitin and topical lidocaine .  She had fu w/ Dr. Autumn on 04/19/24 at which time she was noted to be recovering well from her treatment. She continued using Domeboro and Silvadene  at that time but reported skin changes were improving. His plan is for MRI of the abdomen/pelvis and CT of the chest in 3 months.   On 04/20/24 she had her first post-treatment visit with physical therapy session in setting of having received an intensive pelvic chemoradiation treatment.  Today she reports significant improvement of her symptoms. Her energy is improving everyday and while it is not completely back to baseline she is so active she is moving faster than her husband when they go on walks. She reports she's able to walk for up to an hour at a time. She's recently done a yoga class as well. Her appetite is improving everyday and she is slowly gaining weight.  She is no longer using domeboro or silvadene  and thinks her skin is  almost back to baseline. She is not using any medicine for pain and thinks her bottom/pelvis is feeling almost back to normal. She is able to sit and lie flat comfortable.   She is disappointed to be losing some hair but she is overall very happy with her great recovery.  ALLERGIES:  has no known allergies.  Meds: Current Outpatient Medications  Medication Sig Dispense Refill   aluminum  sulfate-calcium  acetate (DOMEBORO) packet Apply 1 packet topically 3 (three) times daily. (Patient taking differently: Apply 1 packet topically daily at 12 noon.) 100 each 0   Ascorbic Acid (VITAMIN C) 1000 MG tablet Take 1,000 mg by mouth daily.     calcium  carbonate (OS-CAL) 600 MG TABS Take 600 mg by mouth daily.     cholecalciferol (VITAMIN D ) 1000 UNITS tablet Take 1,000 Units by mouth daily.     clonazePAM  (KLONOPIN ) 0.1 mg/mL SUSP Take 5 mLs (0.5 mg total) by mouth 2 (two) times daily as needed. (Patient taking differently: Take 0.5 mg by mouth 2 (two) times daily as needed (burning mouth).) 300 mL 1   denosumab  (PROLIA ) 60 MG/ML SOSY injection Inject 60 mg into the skin every 6 (six) months.     escitalopram  (LEXAPRO ) 10 MG tablet Take 1 tablet (10 mg total) by mouth daily. 30 tablet 11   hydrOXYzine  (ATARAX ) 10 MG tablet Take 1 tablet (10 mg total) by mouth 3 (three) times daily as needed for anxiety. 30 tablet 0   LORazepam  (ATIVAN ) 0.5 MG tablet Take  1 tablet (0.5 mg total) by mouth at bedtime. (Patient taking differently: Take 0.5 mg by mouth at bedtime as needed for anxiety, seizure, sedation or sleep.) 15 tablet 0   meloxicam (MOBIC) 15 MG tablet Take 1 tablet by mouth as needed for pain.     morphine  (MS CONTIN ) 15 MG 12 hr tablet Take 1 tablet (15 mg total) by mouth every morning. (Patient not taking: Reported on 04/19/2024)     NON FORMULARY Diltiazem 2%/Lidocaine5% compound Apply to rectum 3 times daily for 8 weeks (Patient not taking: Reported on 04/19/2024) 30 g 1   polyethylene glycol  (MIRALAX  / GLYCOLAX ) 17 g packet Take 17 g by mouth daily.     triamcinolone  ointment (KENALOG ) 0.5 % Apply 1 Application topically 2 (two) times daily. (Patient not taking: Reported on 04/19/2024) 45 g 0   No current facility-administered medications for this visit.    Physical Findings: The patient is in no acute distress. Patient is alert and oriented.  weight is 119 lb (54 kg). Her temporal temperature is 97.3 F (36.3 C) (abnormal). Her blood pressure is 97/70 and her pulse is 71. Her respiration is 18 and oxygen saturation is 99%. .    Pelvic exam:  External exam shows normal perineal skin without erythema, desquamation or hyperpigmentation. Mild patchy, resolving hyperpigmentation of the bilateral groins present. No tenderness to palpation of perineum or external genitalia. Perianal skin without erythema or desquamation.    Lab Findings: Lab Results  Component Value Date   WBC 9.2 04/19/2024   HGB 12.8 04/19/2024   HCT 38.5 04/19/2024   MCV 96.5 04/19/2024   PLT 423 (H) 04/19/2024    Radiographic Findings: No results found.  Impression:  Ms. 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 The patient is recovering well from the effects of treatment.  Plan:    -clinical surveillance per NCCN guidelines with DRE and anoscopy and imaging per medical oncology at 3 months. Imaging ordered by Dr. Conchetta and will refer her to colorectal surgery for surveillance DREs. Fu in our clinic 6 weeks after fu w/ Dr. Conchetta so visits are staggered  -continue to monitor evolving treatment toxicities -provided education on vaginal dilator use -continue fu w/ physical therapy. Will touch base with PT re pelvic PT exercises and rectal dilator  Total time spent today in preparation for this visit was 50 minutes. This included patient care, imaging and path review, documentation, multidisciplinary discussion and coordination of  care and follow up.    Katrina [name] Katrina [name]. Katrina [name], M.D.    "

## 2024-05-07 NOTE — Therapy (Signed)
 " OUTPATIENT PHYSICAL THERAPY FEMALE PELVIC TREATMENT   Patient Name: Katrina [name] MRN: [id number] DOB:August 15, 1955, 69 y.o., female Today's Date: 05/08/2024  END OF SESSION:  PT End of Session - 05/08/24 1402     Visit Number 4    Date for Recertification  08/06/24    Authorization Type aetna [id number]  no auth req  medical necessity    Progress Note Due on Visit 10    PT Start Time 1400    PT Stop Time 1450    PT Time Calculation (min) 50 min    Activity Tolerance Patient tolerated treatment well;Patient limited by pain    Behavior During Therapy [facility] for tasks assessed/performed             Past Medical History:  Diagnosis Date   Arthritis    --rheumatoid   Colon polyp    Osteoporosis 04/05/14   T score -2.6 spine   Salpingitis    Had appendectomy at same time as salpingitis dx was made.   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, anus and anal canal (HCC) 01/27/2024   Situational insomnia 01/27/2024   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   Rectal bleeding 11/28/2023   Age-related osteoporosis without current pathological fracture 03/26/2023   Osteopenia 08/23/2022   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  03/15/2018   Chronic cough 01/27/2018   Rheumatoid arthritis (HCC) 04/05/2011    PCP: [name], MD  REFERRING PROVIDER: Autumn Millman, MD  REFERRING  DIAG: [phone] (ICD-10-CM) - Primary squamous cell carcinoma of anal canal  THERAPY DIAG:  Cramp and spasm  Other lack of coordination  Rationale for Evaluation and Treatment: Rehabilitation  ONSET DATE: July 2025  SUBJECTIVE:                                                                                                                                                                                           SUBJECTIVE STATEMENT: I am doing well Feeling more and more like myself  Not going to restaurants, going to grocery store Walking in the park, up some hills too. I am doing exercises from PT and yoga I want to work on my muscles, I can tell that I am tight, I need to work on global stregthening I am going to go some yoga tomorrow Digestion is finding new rhythm, felt like was getting constipated     Last visit Patient reports that she feels like she is coming back to life Slept in boxer drawers, feels like there is some seepage.  Has more energy, drove here.  Gi still adjusting Was able to cook and appetite is better, adding new foods in, system is adjusting Was on low fiber diet for month      Last session Patient reports that she completed her treatment and it was tough. She had 30 (6 weeks) radiation sessions. Chemo as well- 2 rounds.   Her blood count dropped to 300, had to stay 4 nights in the hospital, had UTI as well, very painful She feels ok now.  She was on opioids for a month, she came off of them on Monday Sometimes she gets upset stomach Patient has no pain now, anal area is tender Not constipated Feels tired overall, does not have a lot of energy, maybe 30-40% of her energy Still wearing depends, passed gas yesterday and had an accident.  Not sure what to expect from PT now, wants to get back to normal movement, activity, yoga She is not sure what will happen with her BM Used to be able to walk 3 miles, can do 20 mins now due to cold Was able to do  1 yoga class, wants to be able to do that again Has not been doing any shopping running errands, was not sure if it safe for her to do Until Monday she was not able to drive due to taking morphine  Is so happy to be here Lost about 15 lbs   eval Patient recently diagnosed with anal cancer. Patient reports that she has pain in the anal canal.  She awaiting radiation treatment Tx will be 28 sessions, 5 weeks.  Has a tumor that is obstructing the canal, takes miralax  daily Sitting is uncomfortable. She has been walking. Used to do yoga and swim but not since the diagnosis. She is nervous about the upcoming treatment.  Fluid intake: water  FUNCTIONAL LIMITATIONS: pretty healthy  PERTINENT HISTORY:  Medications for current condition: Mobic Surgeries: no Other: - Sexual abuse: No  DIAGNOSTIC FINDINGS:  Post-void residual: Voiding Cystourethrogram (VCUG):  Ultrasound: PAIN:  Are you having pain? yes NPRS scale: 4-7/10 Pain location: Internal  Pain type: aching, burning, and sharp Pain description: intermittent   Aggravating factors: not Relieving factors: Meloxicam  PRECAUTIONS: None  RED FLAGS: None   WEIGHT BEARING RESTRICTIONS: No  FALLS:  Has patient fallen in last 6 months? No  OCCUPATION: retired  ACTIVITY LEVEL : pretty active, walks every day, used to do yoga, swims  PLOF: Independent  PATIENT GOALS: does not have any now   BOWEL MOVEMENT: Pain with bowel movement: Yes Type of bowel movement:Type (Bristol Stool Scale) 4, Frequency 3-5 times, Strain no, and Splinting no Fully empty rectum: no Leakage: some  Pads: No Fiber supplement/laxative Yes miralax   URINATION:no issues  INTERCOURSE: not active   PREGNANCY:no   PROLAPSE: None   OBJECTIVE:  Note: Objective measures were completed at Evaluation unless otherwise noted.    PATIENT SURVEYS:    PFIQ-7: 61  COGNITION: Overall cognitive  status: Within functional limits for tasks assessed     SENSATION: Light touch: Appears intact  LUMBAR SPECIAL TESTS:  Slump test: Negative  FUNCTIONAL TESTS:  5 times sit to stand: 7 Single leg stance: mild valgus bilat   Sit-up test:1/3  Squat:within functional limitations  Bed mobility:within functional limitations   GAIT: Assistive device utilized: None Comments: within functional limitations   POSTURE: No Significant postural limitations   LUMBARAROM/PROM:  A/PROM A/PROM  Eval (% available)  Flexion 90  Extension 90  Right lateral flexion 90  Left lateral flexion 90  Right rotation 90  Left rotation 90   (Blank rows = not tested)  LOWER EXTREMITY ROM: within functional limitations    LOWER EXTREMITY MMT:5/5   PALPATION:  General: tight and tender external and internal anal sphincter  Pelvic Alignment: even  Abdominal: able to dem 360 breathing with relaxed shoulders  Diastasis: No Distortion: No  Breathing: able to dem 360 breathing with relaxed shoulders Scar tissue: no                External Perineal Exam: within functional limitations                              Internal Pelvic Floor: tight and tender external and internal sphincters  Patient confirms identification and approves PT to assess internal pelvic floor and treatment No  PELVIC MMT:   MMT eval  Vaginal   Internal Anal Sphincter 5/5  External Anal Sphincter 5/5  Puborectalis   Diastasis Recti no  (Blank rows = not tested)        TONE: high  PROLAPSE: no  TODAY'S TREATMENT:                                                                                                                              DATE:  05/08/2024 There acts- stationary bike 7 mins with therapist present to discuss progress Hip abduction machine 3 plates bilateral 25 reps Hip extension machine 3 plates bilateral 25 reps Seated chest press #7 + exhale 20 reps Standing balance on foam - tandem stance with  head turns, unilat stance, heel rocking, holding #7, heel raises 20 reps Side stepping 3 laps #7 Sit to stand with #7 20 reps Walkaway with cable #20 lbs 10 reps Lat pulldown +exhale 25 reps 1 plate   87/77/7974 Nu Step for 12 minutes, level 5, bilateral upper and lower extremities, therapist present to discuss progress  Hip abduction machine 10 reps X 4 reps Leg press with exhale 55 lbs 30 reps Using zipper metaphor from yoga and breath to expand and  contract pelvic floor Farmers' carry 2 laps #10 and #5 Lunge stretch with 2 blocks 2 mins bilat Diaphragmatic breathing in child's pose 2 reps   04/20/2024 Nu Step for 15 minutes, level 5, bilateral upper and lower extremities, therapist present to discuss progress  5 sit to stand 7:30 Education on potential use of rectal dilators  Bristol stool scale 4-6 now, not using miralax      02/01/2024  EVAL  Examination completed, findings reviewed, pt educated on POC, HEP, and female pelvic floor anatomy, reasoning with pelvic floor assessment internally with pt consent. Pt motivated to participate in PT and agreeable to attempt recommendations.     PATIENT EDUCATION/ there acts:   Education details: Pt was educated on relevant anatomy, exam findings, home exercise program, plan of care, expectations of PT and healthy bowel PT recommendations  Person educated: Patient Education method: Explanation, Demonstration, Tactile cues, Verbal cues, and Handouts Education comprehension: verbalized understanding, returned demonstration, verbal cues required, tactile cues required, and needs further education  HOME EXERCISE PROGRAM: Access Code: [email or web address] URL: [email or web address] Date: 02/01/2024 Prepared by: Cori Saquoia Sianez  Exercises - Cat Cow  - 1 x daily - 7 x weekly - 2 sets - 10 reps - Supine Figure 4 Piriformis Stretch  - 1 x daily - 7 x weekly - 2 sets - 10 reps - Child's Pose Stretch  - 1 x daily - 7 x weekly - 2 sets - 10  reps - Supine Diaphragmatic Breathing with Pelvic Floor Lengthening  - 1 x daily - 7 x weekly - 2 sets - 10 reps - Pelvic Floor Lengthening in Hooklying  - 1 x daily - 7 x weekly - 2 sets - 10 reps  Patient Education - Get To Know Your Pelvic Floor- Female - High-Fiber Diet to Support Pelvic Health - Abdominal Massage for Constipation - Abdominal Massage for Constipation  ASSESSMENT:  CLINICAL IMPRESSION: Patient returning to PT after treatment for anal cancer. She has lost a lot of weight, has reduced endurance and strength, her goals include to get back to yoga, walking and swimming. She has improving bristol stool scale, sometimes constipated now, eating more fiber, returning to walking and had more energy today. Did well with her global strengthening exercises today. Bilateral lower extremity strength is 4+/5 Did well with her exercises today, will continue to benefit from PT to recover from cancer treatment.       From eval Patient is a 69 y.o. F who was seen today for physical therapy evaluation and treatment for physical therapy assessment prior to radiation for anal cancer. Patient with tight and tender internal and external anal sphincter and high tone pelvic floor. Decreased coordination, likely due to guarding and fear of having a fecal accident. Findings notable for good trunk flexibility and diaphragmatic breathing.   OBJECTIVE IMPAIRMENTS: decreased coordination, decreased ROM, impaired tone, and pain.   ACTIVITY LIMITATIONS: sitting, continence, and toileting  PARTICIPATION LIMITATIONS: community activity  PERSONAL FACTORS: Fitness and 1 comorbidity: anal cancer are also affecting patient's functional outcome.   REHAB POTENTIAL: Good  CLINICAL DECISION MAKING: Evolving/moderate complexity  EVALUATION COMPLEXITY: Moderate   GOALS: Goals reviewed with patient? Yes  SHORT TERM GOALS: Target date: 02/29/2024    Pt will be independent with HEP.   Baseline: Goal  status: ongoing  2.  Pt will be independent with use of squatty potty, relaxed toileting mechanics, and improved bowel movement techniques in order to increase ease of bowel movements and complete evacuation.   Baseline:  Goal status: INITIAL  3.  Patient will be educated on rectal dilators Baseline:  Goal status: INITIAL  4.  Patient will soak 0 pads/ day Baseline:  Goal status: INITIAL   LONG TERM GOALS: Target date: 04/25/2024 updated to 07/25/2023    Pt will be independent with advanced HEP.   Baseline:  Goal status: initial  2.  Patient will be I with use of dilators  Baseline:  Goal status: INITIAL  3.  Patient will have regular bowel movements, bristol stool scale type 3-4 Baseline: 7 Goal status: INITIAL  4.  Patient will participate in 40 minute PT session without increased pain Baseline:  Goal status: ongoing (endurance is limiting her post treatment now 04/2024)  5.  Patient will be able to sit as long as needed- at least 1 hr- without increased pain Baseline:  Goal status: ongoing  6.  Patient will report no fecal accidents/ day Baseline:  Goal status: ongoing  PLAN:  PT FREQUENCY: 1-2x/week  PT DURATION: 12 weeks  PLANNED INTERVENTIONS: 97110-Therapeutic exercises, 97530- Therapeutic activity, 97112- Neuromuscular re-education, 97535- Self Care, [id number]- Manual therapy, [phone]- Gait training, [phone] (1-2 muscles), [phone] (3+ muscles)- Dry Needling, Patient/Family education, Balance training, Taping, Joint mobilization, Joint manipulation, Spinal manipulation, Spinal mobilization, Manual lymph drainage, Scar mobilization, Cryotherapy, Moist heat, and Biofeedback  PLAN FOR NEXT SESSION: reassessment after radiation, educate patient on dilators, continue exercises for down training/ up training as needed   Tyliyah Mcmeekin, PT 05/08/2024, 2:51 PM  "

## 2024-05-08 ENCOUNTER — Ambulatory Visit: Attending: Oncology | Admitting: Physical Therapy

## 2024-05-08 ENCOUNTER — Encounter: Payer: Self-pay | Admitting: Physical Therapy

## 2024-05-08 DIAGNOSIS — R278 Other lack of coordination: Secondary | ICD-10-CM | POA: Insufficient documentation

## 2024-05-08 DIAGNOSIS — R252 Cramp and spasm: Secondary | ICD-10-CM | POA: Insufficient documentation

## 2024-05-09 ENCOUNTER — Ambulatory Visit: Admitting: Radiation Oncology

## 2024-05-09 NOTE — Progress Notes (Signed)
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 She completed their radiation on 03/24/2024.      Does the patient complain of any of the following:         Pain :   Diarrhea/Constipation:   Nausea/Vomiting: Denies  Urinary Issues (dysuria/incomplete emptying/ incontinence/ increased frequency/urgency):    Post radiation skin changes:       Additional comments if applicable:         "

## 2024-05-10 ENCOUNTER — Ambulatory Visit
Admission: RE | Admit: 2024-05-10 | Discharge: 2024-05-10 | Disposition: A | Discharge status: Home / Self Care | Source: Ambulatory Visit | Attending: Radiation Oncology | Admitting: Radiation Oncology

## 2024-05-10 ENCOUNTER — Encounter: Payer: Self-pay | Admitting: Radiation Oncology

## 2024-05-10 VITALS — BP 97/70 | HR 71 | Temp 97.3°F | Resp 18 | Wt 119.0 lb

## 2024-05-10 DIAGNOSIS — Z79891 Long term (current) use of opiate analgesic: Secondary | ICD-10-CM | POA: Insufficient documentation

## 2024-05-10 DIAGNOSIS — C218 Malignant neoplasm of overlapping sites of rectum, anus and anal canal: Secondary | ICD-10-CM | POA: Diagnosis present

## 2024-05-10 DIAGNOSIS — Z79899 Other long term (current) drug therapy: Secondary | ICD-10-CM | POA: Insufficient documentation

## 2024-05-10 DIAGNOSIS — Z923 Personal history of irradiation: Secondary | ICD-10-CM | POA: Diagnosis not present

## 2024-05-11 ENCOUNTER — Inpatient Hospital Stay: Admitting: Dietician

## 2024-05-11 ENCOUNTER — Telehealth: Payer: Self-pay | Admitting: Oncology

## 2024-05-11 NOTE — Telephone Encounter (Signed)
 I spoke with patient's spouse and he stated patient will give me a call back to schedule appointment with MD 1 week after CT 07/05/2024.

## 2024-05-12 ENCOUNTER — Telehealth: Payer: Self-pay | Admitting: Oncology

## 2024-05-12 NOTE — Telephone Encounter (Signed)
 spoke w pt regarding scheduling visit 1 week after CT, pt is aware of new appt date and time.

## 2024-05-22 NOTE — Therapy (Signed)
 " OUTPATIENT PHYSICAL THERAPY FEMALE PELVIC TREATMENT   Patient Name: Katrina [name] MRN: [id number] DOB:1956-02-08, 69 y.o., female Today's Date: 05/23/2024  END OF SESSION:  PT End of Session - 05/23/24 1415     Visit Number 5    Date for Recertification  08/06/24    Authorization Type aetna [id number]  no auth req  medical necessity    Progress Note Due on Visit 10    PT Start Time 1403    PT Stop Time 1445    PT Time Calculation (min) 42 min    Activity Tolerance Patient tolerated treatment well;Patient limited by pain    Behavior During Therapy [facility] for tasks assessed/performed              Past Medical History:  Diagnosis Date   Arthritis    --rheumatoid   Colon polyp    Osteoporosis 04/05/14   T score -2.6 spine   Salpingitis    Had appendectomy at same time as salpingitis dx was made.   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, anus and anal canal (HCC) 01/27/2024   Situational insomnia 01/27/2024   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   Rectal bleeding 11/28/2023   Age-related osteoporosis without current pathological fracture 03/26/2023   Osteopenia 08/23/2022   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  03/15/2018   Chronic cough 01/27/2018   Rheumatoid arthritis (HCC) 04/05/2011    PCP: [name], MD  REFERRING PROVIDER: Autumn Millman,  MD  REFERRING DIAG: [phone] (ICD-10-CM) - Primary squamous cell carcinoma of anal canal  THERAPY DIAG:  Cramp and spasm  Other lack of coordination  Rationale for Evaluation and Treatment: Rehabilitation  ONSET DATE: July 2025  SUBJECTIVE:                                                                                                                                                                                           SUBJECTIVE STATEMENT: Patient reports that she is doing well Wondering about  about dilators She has been drinking benefiber, still wearing depends, did not get to the bathroom on time, once in the last 3-4 weeks- when she went walking and she was not sure if she needs to have a BM She walks 3 miles every day now, made a pie last night, does Zoom yoga and it's getting too easy, feels very lucky       Last visit I am doing well Feeling more and more like myself Not going to restaurants, going to grocery store Walking in the park, up some hills too. I am doing exercises from PT and yoga I want to work on my muscles, I can tell that I am tight, I need to work on global stregthening I am going to go some yoga tomorrow Digestion is finding new rhythm, felt like was getting constipated     Last visit Patient reports that she feels like she is coming back to life Slept in boxer drawers, feels like there is some seepage.  Has more energy, drove here.  Gi still adjusting Was able to cook and appetite is better, adding new foods in, system is adjusting Was on low fiber diet for month      Last session Patient reports that she completed her treatment and it was tough. She had 30 (6 weeks) radiation sessions. Chemo as well- 2 rounds.   Her blood count dropped to 300, had to stay 4 nights in the hospital, had UTI as well, very painful She feels ok now.  She was on opioids for a month, she came off of them on Monday Sometimes she gets upset stomach Patient  has no pain now, anal area is tender Not constipated Feels tired overall, does not have a lot of energy, maybe 30-40% of her energy Still wearing depends, passed gas yesterday and had an accident.  Not sure what to expect from PT now, wants to get back to normal movement, activity, yoga She is not sure what will happen with her BM Used to be able to walk 3 miles, can do 20 mins now due to cold Was able to do 1 yoga class, wants to be able to do that again Has not been doing any shopping running errands, was not sure if it safe for her to do Until Monday she was not able to drive due to taking morphine  Is so happy to be here Lost about 15 lbs   eval Patient recently diagnosed with anal cancer. Patient reports that she has pain in the anal canal.  She awaiting radiation treatment Tx will be 28 sessions, 5 weeks.  Has a tumor that is obstructing the canal, takes miralax  daily Sitting is uncomfortable. She has been walking. Used to do yoga and swim but not since the diagnosis. She is nervous about the upcoming treatment.  Fluid intake: water  FUNCTIONAL LIMITATIONS: pretty healthy  PERTINENT HISTORY:  Medications for current condition: Mobic Surgeries: no Other: - Sexual abuse: No  DIAGNOSTIC FINDINGS:  Post-void residual: Voiding Cystourethrogram (VCUG):  Ultrasound: PAIN:  Are you having pain? yes NPRS scale: 4-7/10 Pain location: Internal  Pain type: aching, burning, and sharp Pain description: intermittent   Aggravating factors: not Relieving factors: Meloxicam  PRECAUTIONS: None  RED FLAGS: None   WEIGHT BEARING RESTRICTIONS: No  FALLS:  Has patient fallen in last 6 months? No  OCCUPATION: retired  ACTIVITY LEVEL : pretty active, walks every day, used to do yoga, swims  PLOF: Independent  PATIENT GOALS: does not  have any now   BOWEL MOVEMENT: Pain with bowel movement: Yes Type of bowel movement:Type (Bristol Stool Scale) 4, Frequency 3-5 times,  Strain no, and Splinting no Fully empty rectum: no Leakage: some                                                   Pads: No Fiber supplement/laxative Yes miralax   URINATION:no issues  INTERCOURSE: not active   PREGNANCY:no   PROLAPSE: None   OBJECTIVE:  Note: Objective measures were completed at Evaluation unless otherwise noted.    PATIENT SURVEYS:    PFIQ-7: 62  COGNITION: Overall cognitive status: Within functional limits for tasks assessed     SENSATION: Light touch: Appears intact  LUMBAR SPECIAL TESTS:  Slump test: Negative  FUNCTIONAL TESTS:  5 times sit to stand: 7 Single leg stance: mild valgus bilat   Sit-up test:1/3  Squat:within functional limitations  Bed mobility:within functional limitations   GAIT: Assistive device utilized: None Comments: within functional limitations   POSTURE: No Significant postural limitations   LUMBARAROM/PROM:  A/PROM A/PROM  Eval (% available)  Flexion 90  Extension 90  Right lateral flexion 90  Left lateral flexion 90  Right rotation 90  Left rotation 90   (Blank rows = not tested)  LOWER EXTREMITY ROM: within functional limitations    LOWER EXTREMITY MMT:5/5   PALPATION:  General: tight and tender external and internal anal sphincter  Pelvic Alignment: even  Abdominal: able to dem 360 breathing with relaxed shoulders  Diastasis: No Distortion: No  Breathing: able to dem 360 breathing with relaxed shoulders Scar tissue: no                External Perineal Exam: within functional limitations                              Internal Pelvic Floor: tight and tender external and internal sphincters  Patient confirms identification and approves PT to assess internal pelvic floor and treatment No  PELVIC MMT:   MMT eval  Vaginal 2/5- 05/23/24  Internal Anal Sphincter 5/5  External Anal Sphincter 5/5  Puborectalis   Diastasis Recti no  (Blank rows = not tested)         TONE: high  PROLAPSE: no  TODAY'S TREATMENT:                                                                                                                              DATE:  05/23/2024 Patient confirms identification and approves physical therapist to perform internal soft tissue work   Education on vaginal and rectal dilators, vaginal wand Wand and dilators demonstrated Education on how to use the dilator or wand-with diaphragmatic breathing, knee fallout, 2 mins every other  day   05/08/2024 There acts- stationary bike 7 mins with therapist present to discuss progress Hip abduction machine 3 plates bilateral 25 reps Hip extension machine 3 plates bilateral 25 reps Seated chest press #7 + exhale 20 reps Standing balance on foam - tandem stance with head turns, unilat stance, heel rocking, holding #7, heel raises 20 reps Side stepping 3 laps #7 Sit to stand with #7 20 reps Walkaway with cable #20 lbs 10 reps Lat pulldown +exhale 25 reps 1 plate   87/77/7974 Nu Step for 12 minutes, level 5, bilateral upper and lower extremities, therapist present to discuss progress  Hip abduction machine 10 reps X 4 reps Leg press with exhale 55 lbs 30 reps Using zipper metaphor from yoga and breath to expand and contract pelvic floor Farmers' carry 2 laps #10 and #5 Lunge stretch with 2 blocks 2 mins bilat Diaphragmatic breathing in child's pose 2 reps   04/20/2024 Nu Step for 15 minutes, level 5, bilateral upper and lower extremities, therapist present to discuss progress  5 sit to stand 7:30 Education on potential use of rectal dilators  Bristol stool scale 4-6 now, not using miralax      02/01/2024  EVAL  Examination completed, findings reviewed, pt educated on POC, HEP, and female pelvic floor anatomy, reasoning with pelvic floor assessment internally with pt consent. Pt motivated to participate in PT and agreeable to attempt recommendations.     PATIENT EDUCATION/ there acts:    Education details: Pt was educated on relevant anatomy, exam findings, home exercise program, plan of care, expectations of PT and healthy bowel PT recommendations  Person educated: Patient Education method: Explanation, Demonstration, Tactile cues, Verbal cues, and Handouts Education comprehension: verbalized understanding, returned demonstration, verbal cues required, tactile cues required, and needs further education  HOME EXERCISE PROGRAM: Access Code: [email or web address] URL: [email or web address] Date: 02/01/2024 Prepared by: Cori Makaya Juneau  Exercises - Cat Cow  - 1 x daily - 7 x weekly - 2 sets - 10 reps - Supine Figure 4 Piriformis Stretch  - 1 x daily - 7 x weekly - 2 sets - 10 reps - Child's Pose Stretch  - 1 x daily - 7 x weekly - 2 sets - 10 reps - Supine Diaphragmatic Breathing with Pelvic Floor Lengthening  - 1 x daily - 7 x weekly - 2 sets - 10 reps - Pelvic Floor Lengthening in Hooklying  - 1 x daily - 7 x weekly - 2 sets - 10 reps  Patient Education - Get To Know Your Pelvic Floor- Female - High-Fiber Diet to Support Pelvic Health - Abdominal Massage for Constipation - Abdominal Massage for Constipation  ASSESSMENT:  CLINICAL IMPRESSION: Patient returning to PT after treatment for anal cancer. She has lost a lot of weight, has reduced endurance and strength, her goals include to get back to yoga, walking and swimming. She has improving bristol stool scale, sometimes constipated now, eating more fiber, walking and had more energy today again.She had tightness in her pelvic floor checked vaginally. Able to contract, relax and bulge but will benefit from using wand to lengthen tissues so she can have more comfortable pelvic exams. Educated her on pelvic floor dilators and pelvic wand.Lubricant samples given will continue to benefit from PT to recover from cancer treatment. She will continue to benefit from PT to address deficits.   OBJECTIVE IMPAIRMENTS: decreased  coordination, decreased ROM, impaired tone, and pain.   ACTIVITY LIMITATIONS: sitting, continence, and toileting  PARTICIPATION LIMITATIONS: community activity  PERSONAL FACTORS: Fitness and 1 comorbidity: anal cancer are also affecting patient's functional outcome.   REHAB POTENTIAL: Good  CLINICAL DECISION MAKING: Evolving/moderate complexity  EVALUATION COMPLEXITY: Moderate   GOALS: Goals reviewed with patient? Yes  SHORT TERM GOALS: Target date: 02/29/2024    Pt will be independent with HEP.   Baseline: Goal status: ongoing  2.  Pt will be independent with use of squatty potty, relaxed toileting mechanics, and improved bowel movement techniques in order to increase ease of bowel movements and complete evacuation.   Baseline:  Goal status: ongoing 05/23/24  3.  Patient will be educated on rectal dilators Baseline:  Goal status: met 05/23/24  4.  Patient will soak 0 pads/ day Baseline:  Goal status: met 05/23/24   LONG TERM GOALS: Target date: 04/25/2024 updated to 07/25/2023    Pt will be independent with advanced HEP.   Baseline:  Goal status: met  2.  Patient will be I with use of dilators  Baseline:  Goal status: educated on 05/23/24, she is ordering a wand to start with  3.  Patient will have regular bowel movements, bristol stool scale type 3-4 Baseline: 7 Goal status: INITIAL  4.  Patient will participate in 40 minute PT session without increased pain Baseline:  Goal status: ongoing (endurance is limiting her post treatment now 04/2024)  5.  Patient will be able to sit as long as needed- at least 1 hr- without increased pain Baseline:  Goal status: ongoing  6.  Patient will report no fecal accidents/ day Baseline:  Goal status: ongoing  PLAN:  PT FREQUENCY: 1-2x/week  PT DURATION: 12 weeks  PLANNED INTERVENTIONS: 97110-Therapeutic exercises, 97530- Therapeutic activity, 97112- Neuromuscular re-education, 97535- Self Care, [id number]- Manual  therapy, [phone]- Gait training, [phone] (1-2 muscles), [phone] (3+ muscles)- Dry Needling, Patient/Family education, Balance training, Taping, Joint mobilization, Joint manipulation, Spinal manipulation, Spinal mobilization, Manual lymph drainage, Scar mobilization, Cryotherapy, Moist heat, and Biofeedback  PLAN FOR NEXT SESSION: reassessment after radiation, educate patient on dilators, continue exercises for down training/ up training as needed   Ema Hebner, PT 05/23/2024, 2:42 PM  "

## 2024-05-23 ENCOUNTER — Ambulatory Visit: Admitting: Physical Therapy

## 2024-05-23 ENCOUNTER — Encounter: Payer: Self-pay | Admitting: Physical Therapy

## 2024-05-23 DIAGNOSIS — R252 Cramp and spasm: Secondary | ICD-10-CM

## 2024-05-23 DIAGNOSIS — R278 Other lack of coordination: Secondary | ICD-10-CM

## 2024-05-25 ENCOUNTER — Ambulatory Visit: Admitting: Physical Therapy

## 2024-05-25 ENCOUNTER — Telehealth: Payer: Self-pay | Admitting: Dietician

## 2024-05-25 ENCOUNTER — Inpatient Hospital Stay: Attending: Oncology | Admitting: Dietician

## 2024-05-25 NOTE — Telephone Encounter (Signed)
 Nutrition Follow-up:  Patient with SCC of anal canal. She completed concurrent chemoradiation with mitomycin /5FU. Final RT on 11/21   Spoke with patient via telephone for nutrition follow-up. Patient is doing great. Says she is not quite 100%, but pretty close. Appetite is good. Higher fiber foods are hit or miss. Patient is able to eat salads now which she is very happy about. Patient expresses gratitude for the care she received.    Medications: reviewed   Labs: 12/17 - labs reviewed   Anthropometrics: Last wt 119 lb on 1/7  12/17 - 116 lb 6.4 oz 11/14 - 123 lb 12.8 oz 11/7 - 122 lb 9.6 oz  Estimated Energy Needs  Kcals: 1800-2000 Protein: 71-89 Fluid: >/= 2L  NUTRITION DIAGNOSIS: Food and nutrition related knowledge deficit resolved   INTERVENTION:  Continue including good sources of protein to support post treatment healing     MONITORING, EVALUATION, GOAL: wt trends, intake   NEXT VISIT: No follow-up scheduled. Patient has contact information and will call with any nutrition questions/concerns

## 2024-05-30 NOTE — Therapy (Signed)
 " OUTPATIENT PHYSICAL THERAPY FEMALE PELVIC TREATMENT   Patient Name: Katrina [name] MRN: [id number] DOB:1955-12-17, 69 y.o., female Today's Date: 06/01/2024  END OF SESSION:  PT End of Session - 06/01/24 1402     Visit Number 6    Date for Recertification  08/06/24    Authorization Type aetna mcr [id number]  no auth req  medical necessity    Progress Note Due on Visit 10    PT Start Time 1401    PT Stop Time 1444    PT Time Calculation (min) 43 min    Activity Tolerance Patient tolerated treatment well;Patient limited by pain    Behavior During Therapy [facility] for tasks assessed/performed               Past Medical History:  Diagnosis Date   Arthritis    --rheumatoid   Colon polyp    Osteoporosis 04/05/14   T score -2.6 spine   Salpingitis    Had appendectomy at same time as salpingitis dx was made.   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, anus and anal canal (HCC) 01/27/2024   Situational insomnia 01/27/2024   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   Rectal bleeding 11/28/2023   Age-related osteoporosis without current pathological fracture 03/26/2023   Osteopenia 08/23/2022   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  03/15/2018   Chronic cough 01/27/2018   Rheumatoid arthritis (HCC) 04/05/2011    PCP: [name], MD  REFERRING PROVIDER: Autumn Millman,  MD  REFERRING DIAG: [phone] (ICD-10-CM) - Primary squamous cell carcinoma of anal canal  THERAPY DIAG:  Cramp and spasm  Other lack of coordination  Rationale for Evaluation and Treatment: Rehabilitation  ONSET DATE: July 2025  SUBJECTIVE:                                                                                                                                                                                           SUBJECTIVE STATEMENT: Patient reports that she feels more and more  normal. She went to [facility] last week.  She got the wand  and worked with it this morning for 3 minutes She was able to insert it and relax and move it around Using Miralax  and Benefiber, she is feeling ok, not really constipated Appetite is really good, eating more raw vegetables Awaiting exam with colorectal surgeon next week Wants to work on global strengthening, missed it last week    eval Patient recently diagnosed with anal cancer. Patient reports that she has pain in the anal canal.  She awaiting radiation treatment Tx will be 28 sessions, 5 weeks.  Has a tumor that is obstructing the canal, takes miralax  daily Sitting is uncomfortable. She has been walking. Used to do yoga and swim but not since the diagnosis. She is nervous about the upcoming treatment.  Fluid intake: water  FUNCTIONAL LIMITATIONS: pretty healthy  PERTINENT HISTORY:  Medications for current condition: Mobic Surgeries: no Other: - Sexual abuse: No  DIAGNOSTIC FINDINGS:  Post-void residual: Voiding Cystourethrogram (VCUG):  Ultrasound: PAIN:  Are you having pain? yes NPRS scale: 4-7/10 Pain location: Internal  Pain type: aching, burning, and sharp Pain description: intermittent   Aggravating factors: not Relieving factors: Meloxicam  PRECAUTIONS: None  RED FLAGS: None   WEIGHT BEARING RESTRICTIONS: No  FALLS:  Has patient fallen in last 6 months? No  OCCUPATION: retired  ACTIVITY LEVEL :  pretty active, walks every day, used to do yoga, swims  PLOF: Independent  PATIENT GOALS: does not have any now   BOWEL MOVEMENT: Pain with bowel movement: Yes Type of bowel movement:Type (Bristol Stool Scale) 4, Frequency 3-5 times, Strain no, and Splinting no Fully empty rectum: no Leakage: some                                                   Pads: No Fiber supplement/laxative Yes miralax   URINATION:no issues  INTERCOURSE: not active   PREGNANCY:no   PROLAPSE: None   OBJECTIVE:  Note: Objective measures were completed at Evaluation unless otherwise noted.    PATIENT SURVEYS:    PFIQ-7: 80  COGNITION: Overall cognitive status: Within functional limits for tasks assessed     SENSATION: Light touch: Appears intact  LUMBAR SPECIAL TESTS:  Slump test: Negative  FUNCTIONAL TESTS:  5 times sit to stand: 7 Single leg stance: mild valgus bilat   Sit-up test:1/3  Squat:within functional limitations  Bed mobility:within functional limitations   GAIT: Assistive device utilized: None Comments: within functional limitations   POSTURE: No Significant postural limitations   LUMBARAROM/PROM:  A/PROM A/PROM  Eval (% available)  Flexion 90  Extension 90  Right lateral flexion 90  Left lateral flexion 90  Right rotation 90  Left rotation 90   (Blank rows = not tested)  LOWER EXTREMITY ROM: within functional limitations    LOWER EXTREMITY MMT:5/5   PALPATION:  General: tight and tender external and internal anal sphincter  Pelvic Alignment: even  Abdominal: able to dem 360 breathing with relaxed shoulders  Diastasis: No Distortion: No  Breathing: able to dem 360 breathing with relaxed shoulders Scar tissue: no                External Perineal Exam: within functional limitations  Internal Pelvic Floor: tight and tender external and internal sphincters  Patient confirms identification and approves PT to assess  internal pelvic floor and treatment No  PELVIC MMT:   MMT eval  Vaginal 2/5- 05/23/24  Internal Anal Sphincter 5/5  External Anal Sphincter 5/5  Puborectalis   Diastasis Recti no  (Blank rows = not tested)        TONE: high  PROLAPSE: no  TODAY'S TREATMENT:                                                                                                                              DATE:  06/01/2024 Review of progress Review of how to use vaginal wand Nu Step for 12 minutes, level 5, bilateral upper and lower extremities, therapist present to discuss progress  Leg press 50 lbs 30 reps Rowing machine 2 plates 30 reps Foam balance act- tandem stance, heel rocking, single leg stance      05/23/2024 Patient confirms identification and approves physical therapist to perform internal soft tissue work   Education on vaginal and rectal dilators, vaginal wand Wand and dilators demonstrated Education on how to use the dilator or wand-with diaphragmatic breathing, knee fallout, 2 mins every other day   05/08/2024 There acts- stationary bike 7 mins with therapist present to discuss progress Hip abduction machine 3 plates bilateral 25 reps Hip extension machine 3 plates bilateral 25 reps Seated chest press #7 + exhale 20 reps Standing balance on foam - tandem stance with head turns, unilat stance, heel rocking, holding #7, heel raises 20 reps Side stepping 3 laps #7 Sit to stand with #7 20 reps Walkaway with cable #20 lbs 10 reps Lat pulldown +exhale 25 reps 1 plate   [id number]/77/7974 Nu Step for 12 minutes, level 5, bilateral upper and lower extremities, therapist present to discuss progress  Hip abduction machine 10 reps X 4 reps Leg press with exhale 55 lbs 30 reps Using zipper metaphor from yoga and breath to expand and contract pelvic floor Farmers' carry 2 laps #10 and #5 Lunge stretch with 2 blocks 2 mins bilat Diaphragmatic breathing in child's pose 2 reps   04/20/2024 Nu  Step for 15 minutes, level 5, bilateral upper and lower extremities, therapist present to discuss progress  5 sit to stand 7:30 Education on potential use of rectal dilators  Bristol stool scale 4-6 now, not using miralax      02/01/2024  EVAL  Examination completed, findings reviewed, pt educated on POC, HEP, and female pelvic floor anatomy, reasoning with pelvic floor assessment internally with pt consent. Pt motivated to participate in PT and agreeable to attempt recommendations.     PATIENT EDUCATION/ there acts:   Education details: Pt was educated on relevant anatomy, exam findings, home exercise program, plan of care, expectations of PT and healthy bowel PT recommendations  Person educated: Patient Education method: Explanation, Demonstration, Tactile cues, Verbal cues, and Handouts Education comprehension: verbalized understanding, returned demonstration,  verbal cues required, tactile cues required, and needs further education  HOME EXERCISE PROGRAM: Access Code: [email or web address] URL: [email or web address] Date: 02/01/2024 Prepared by: Cori Anja Neuzil  Exercises - Cat Cow  - 1 x daily - 7 x weekly - 2 sets - 10 reps - Supine Figure 4 Piriformis Stretch  - 1 x daily - 7 x weekly - 2 sets - 10 reps - Child's Pose Stretch  - 1 x daily - 7 x weekly - 2 sets - 10 reps - Supine Diaphragmatic Breathing with Pelvic Floor Lengthening  - 1 x daily - 7 x weekly - 2 sets - 10 reps - Pelvic Floor Lengthening in Hooklying  - 1 x daily - 7 x weekly - 2 sets - 10 reps  Patient Education - Get To Know Your Pelvic Floor- Female - High-Fiber Diet to Support Pelvic Health - Abdominal Massage for Constipation - Abdominal Massage for Constipation  ASSESSMENT:  CLINICAL IMPRESSION: Patient returning to PT after treatment for anal cancer. She has lost a lot of weight, has reduced endurance and strength, her goals include to get back to yoga, walking and swimming. She has improving bristol  stool scale, sometimes constipated now, eating more fiber, walking and had more energy today again.. Will continue to benefit from PT to recover from cancer treatment. She will continue to benefit from PT to address deficits.   OBJECTIVE IMPAIRMENTS: decreased coordination, decreased ROM, impaired tone, and pain.   ACTIVITY LIMITATIONS: sitting, continence, and toileting  PARTICIPATION LIMITATIONS: community activity  PERSONAL FACTORS: Fitness and 1 comorbidity: anal cancer are also affecting patient's functional outcome.   REHAB POTENTIAL: Good  CLINICAL DECISION MAKING: Evolving/moderate complexity  EVALUATION COMPLEXITY: Moderate   GOALS: Goals reviewed with patient? Yes  SHORT TERM GOALS: Target date: 02/29/2024    Pt will be independent with HEP.   Baseline: Goal status: ongoing  2.  Pt will be independent with use of squatty potty, relaxed toileting mechanics, and improved bowel movement techniques in order to increase ease of bowel movements and complete evacuation.   Baseline:  Goal status: ongoing 05/23/24  3.  Patient will be educated on rectal dilators Baseline:  Goal status: met 05/23/24  4.  Patient will soak 0 pads/ day Baseline:  Goal status: met 05/23/24   LONG TERM GOALS: Target date: 04/25/2024 updated to 07/25/2023    Pt will be independent with advanced HEP.   Baseline:  Goal status: met  2.  Patient will be I with use of dilators  Baseline:  Goal status: met- I with wand  3.  Patient will have regular bowel movements, bristol stool scale type 3-4 Baseline: 7 Goal status: INITIAL  4.  Patient will participate in 40 minute PT session without increased pain Baseline:  Goal status:  met 06/01/2024  5.  Patient will be able to sit as long as needed- at least 1 hr- without increased pain Baseline:  Goal status: ongoing  6.  Patient will report no fecal accidents/ day Baseline:  Goal status: ongoing  PLAN:  PT FREQUENCY: 1-2x/week  PT  DURATION: 12 weeks  PLANNED INTERVENTIONS: 97110-Therapeutic exercises, 97530- Therapeutic activity, 97112- Neuromuscular re-education, 97535- Self Care, 02859- Manual therapy, [phone]- Gait training, [phone] (1-2 muscles), 20561 (3+ muscles)- Dry Needling, Patient/Family education, Balance training, Taping, Joint mobilization, Joint manipulation, Spinal manipulation, Spinal mobilization, Manual lymph drainage, Scar mobilization, Cryotherapy, Moist heat, and Biofeedback  PLAN FOR NEXT SESSION: reassessment after radiation, educate patient on dilators, continue exercises for down training/ up training  as needed, continue with balance and global strengthening, endurance, improved energy, check O2 sats   Deronte Solis, PT 06/01/2024, 2:43 PM  "

## 2024-05-30 NOTE — Progress Notes (Incomplete)
 "  Annual Comprehensive Physical Exam   Patient Care Team: Baxley, Ronal PARAS, MD as PCP - General (Internal Medicine) [name] [name], MD as Consulting Physician (Obstetrics and Gynecology) Shila Gustav GAILS, MD as Consulting Physician (Gastroenterology) Dewey Rush, MD as Consulting Physician (Radiation Oncology) Autumn Millman, MD as Consulting Physician (Oncology)  Visit Date: 05/30/24   No chief complaint on file.  Subjective:  Patient: Katrina [name], Female DOB: 31-Oct-1955, 69 y.o. MRN: [id number] There were no vitals filed for this visit. Katrina [name] is a 69 y.o. Female who presents today for her Annual Comprehensive Physical exam. 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  History of Rheumatoid Arthritis (CCP positive) treated with Methotrexate 15 mg weekly and 15 mg Meloxicam as needed. Followed by Dr. Mai, Rheumatologist. Per her report: very well managed. I can't feel it.     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   Labs ***/***/*** {Labs (Optional):31667}  07/19/2023 Mammogram No mammographic evidence of malignancy. Repeat in one year.    08/20/2023 Bone density The BMD measured at Femur Neck Right is 0.715 g/cm2 with a T-score of -2.3.   05/14/2022 Coronary calcium  score: 151.   Health Maintenance  Topic Date Due   COVID-19 Vaccine (6 - 2025-26 season) 01/03/2024   Medicare Annual Wellness (AWV)  06/10/2024   Colonoscopy   01/17/2025   Mammogram  07/18/2025   DTaP/Tdap/Td (3 - Td or Tdap) 05/13/2031   Pneumococcal Vaccine: 50+ Years  Completed   Influenza Vaccine  Completed   Bone Density Scan  Completed   Hepatitis C Screening  Completed   Zoster Vaccines- Shingrix  Completed   Meningococcal B Vaccine  Aged Out    {Man or Woman:32389}  Vaccine Counseling: Due for {Vaccines:32291::Influenza}; UTD on {Vaccines:32291::Influenza}  ROS Objective:  Vitals: body mass index is unknown because there is no height or weight on file.There were no vitals filed for this visit. Physical Exam  Current Outpatient Medications  Medication Instructions   aluminum  sulfate-calcium  acetate (DOMEBORO) packet 1 packet, Topical, 3 times daily   calcium  carbonate (OS-CAL) 600 mg, Daily   cholecalciferol (VITAMIN D ) 1,000 Units, Daily   clonazePAM  (KLONOPIN ) 0.5 mg, Oral, 2 times daily PRN   denosumab  (PROLIA ) 60 mg, Every 6 months   escitalopram  (LEXAPRO ) 10 mg, Oral, Daily   hydrOXYzine  (ATARAX ) 10 mg, Oral, 3 times daily PRN   LORazepam  (ATIVAN ) 0.5 mg, Oral, Daily at bedtime   meloxicam (MOBIC) 15 MG tablet 1 tablet, As needed   morphine  (MS CONTIN ) 15 mg, Oral, Every morning   NON FORMULARY Diltiazem 2%/Lidocaine5% compound Apply to rectum 3 times daily for 8 weeks   polyethylene glycol (MIRALAX  / GLYCOLAX ) 17 g, Oral, Daily   triamcinolone  ointment (KENALOG ) 0.5 % 1 Application, Topical, 2 times daily   vitamin C 1,000 mg, Daily   Past Medical History:  Diagnosis Date   Arthritis    --rheumatoid   Colon polyp    Osteoporosis 04/05/14   T score -2.6 spine   Salpingitis    Had appendectomy at same time as salpingitis dx was made.  Medical/Surgical History Narrative:  Allergic/Intolerant to: Allergies[1] 2023 - In December she was seen at [facility] for Closed Fracture of Right Distal Fibula. This was treated with splinting, crutches and nonweightbearing.     2014 - hx of Tonsillectomy and Adenoidectomy     Other - hx of Appendectomy. Hx of Excisional Breast Biopsy on the left, which was benign.   Past Surgical History:  Procedure Laterality Date   APPENDECTOMY     BREAST EXCISIONAL BIOPSY Left    COLONOSCOPY     FINGER FRACTURE SURGERY Right 10/2000   left breast biopsy     POLYPECTOMY     TONSILLECTOMY AND ADENOIDECTOMY  2014   Family History  Problem Relation Age of Onset   Thyroid disease Mother        thyroidectomy in her 20's--unsure of reason   Lung cancer Mother 64   Heart disease Father    Hypertension Father    Hyperlipidemia Father    Bladder Cancer Father 33   Breast cancer Maternal Aunt 75 - 79   Stroke Paternal Grandfather    Colon cancer Neg Hx    Pancreatic cancer Neg Hx    Esophageal cancer Neg Hx    Rectal cancer Neg Hx    Stomach cancer Neg Hx    Family History Narrative: {ELFamHX:31110} 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  She and her husband has spent considerable time sailing.  They have no children. Exercises with walking, yoga and swimming. Quit smoking ~30 years ago w/ a 20-pack-year history. Does not drink alcohol.   Most Recent Health Risks Assessment:   Most Recent Social Determinants of Health (Including Hx of Tobacco, Alcohol, and Drug Use) SDOH Screenings   Food Insecurity: No Food Insecurity (03/26/2024)  Housing: Low Risk (03/26/2024)  Transportation Needs: No Transportation Needs (03/26/2024)  Utilities: Not At Risk (03/26/2024)  Depression (PHQ2-9): Low Risk (03/17/2024)  Financial Resource Strain: Low Risk (11/18/2023)  Physical Activity: Sufficiently Active (11/18/2023)  Social Connections: Moderately Isolated (03/26/2024)  Stress: No Stress Concern Present (11/18/2023)  Tobacco Use: Medium Risk (05/23/2024)  Health Literacy: Adequate Health Literacy (05/12/2023)   Social History[2] Most Recent Functional Status Assessment:    03/26/2024    4:38 PM  In  your present state of health, do you have any difficulty performing the following activities:  Hearing? 0  Vision? 0  Difficulty concentrating or making decisions? 0  Doing errands, shopping? 0   Most Recent Fall Risk Assessment:    05/17/2023    2:58 PM  Fall Risk   Falls in the past year? 0  Number falls in past yr: 0  Injury with Fall? 0   Risk for fall due to : No Fall Risks  Follow up Falls prevention discussed;Education provided;Falls evaluation completed     Data saved with a previous flowsheet row definition   Most Recent Anxiety/Depression Screenings:    03/17/2024    8:00 AM 03/13/2024    8:04 AM  PHQ 2/9 Scores  PHQ - 2 Score 0 0       No data to display         Most Recent Cognitive Screening:    05/17/2023    3:15 PM  6CIT Screen  What Year? 0 points  What month? 0 points  What time? 0 points  Count back from 20 0 points  Months in reverse 0 points  Repeat phrase 0  points  Total Score 0 points   Most Recent Vision/Hearing Screenings:No results found. Results:  Studies Obtained And Personally Reviewed By Me: Diabetic Foot Exam - Simple   No data filed     {Imaging, colonoscopy, mammogram, bone density scan, echocardiogram, heart cath, stress test, CT calcium  score, etc.:32292}  Labs:  CBC w/ Differential Lab Results  Component Value Date   WBC 9.2 04/19/2024   RBC 3.99 04/19/2024   HGB 12.8 04/19/2024   HCT 38.5 04/19/2024   PLT 423 (H) 04/19/2024   MCV 96.5 04/19/2024   MCH 32.1 04/19/2024   MCHC 33.2 04/19/2024   RDW 16.2 (H) 04/19/2024   MPV 9.9 11/18/2023   LYMPHSABS 2.1 04/19/2024   MONOABS 1.7 (H) 04/19/2024   BASOSABS 0.2 (H) 04/19/2024    Comprehensive Metabolic Panel Lab Results  Component Value Date   NA 141 04/19/2024   K 3.6 04/19/2024   CL 104 04/19/2024   CO2 28 04/19/2024   GLUCOSE 104 (H) 04/19/2024   BUN 8 04/19/2024   CREATININE 0.70 04/19/2024   CALCIUM  9.7 04/19/2024   PROT 7.7 04/19/2024   ALBUMIN 3.9  04/19/2024   AST 34 04/19/2024   ALT 25 04/19/2024   ALKPHOS 68 04/19/2024   BILITOT 0.4 04/19/2024   EGFR 95.0 09/03/2023   GFRNONAA >60 04/19/2024   Lipid Panel  Lab Results  Component Value Date   CHOL 195 05/13/2023   HDL 91 05/13/2023   LDLCALC 87 05/13/2023   TRIG 81 05/13/2023   A1c No results found for: HGBA1C  TSH Lab Results  Component Value Date   TSH 1.92 05/13/2023   PSA{PSA (Optional):32132} No results found for any visits on 06/12/24. Assessment & Plan:  No orders of the defined types were placed in this encounter.  No orders of the defined types were placed in this encounter.  Other Labs Reviewed today:    No follow-ups on file.   Annual Comprehensive Physical Exam done today including the all of the following: Reviewed patient's Family Medical History Reviewed patient's SDOH and reviewed tobacco, alcohol, and drug use.  Reviewed and updated list of patient's medical providers Assessment of cognitive impairment was done Assessed patient's functional ability Established a written schedule for health screening services Health Risk Assessent Completed and Reviewed  Discussed health benefits of physical activity, and encouraged her to engage in regular exercise appropriate for her age and condition.    I,Makayla C Reid,acting as a scribe for Ronal JINNY Hailstone, MD.,have documented all relevant documentation on the behalf of Ronal JINNY Hailstone, MD,as directed by  Ronal JINNY Hailstone, MD while in the presence of Ronal JINNY Hailstone, MD.  I, Ronal JINNY Hailstone, MD, have reviewed all documentation for and agree with the above Annual Wellness Visit documentation.  Ronal JINNY Hailstone, MD Internal Medicine 06/12/2024    [1] No Known Allergies [2]  Social History Tobacco Use   Smoking status: Former    Current packs/day: 0.00    Average packs/day: 1 pack/day for 20.0 years (20.0 ttl pk-yrs)    Types: Cigarettes    Start date: 05/04/1970    Quit date: 05/04/1990    Years since  quitting: 34.0   Smokeless tobacco: Never  Vaping Use   Vaping status: Never Used  Substance Use Topics   Alcohol use: No    Alcohol/week: 0.0 standard drinks of alcohol   Drug use: No   "

## 2024-06-01 ENCOUNTER — Encounter: Payer: Self-pay | Admitting: Physical Therapy

## 2024-06-01 ENCOUNTER — Ambulatory Visit: Admitting: Physical Therapy

## 2024-06-01 DIAGNOSIS — R278 Other lack of coordination: Secondary | ICD-10-CM

## 2024-06-01 DIAGNOSIS — R252 Cramp and spasm: Secondary | ICD-10-CM

## 2024-06-07 ENCOUNTER — Telehealth: Payer: Self-pay | Admitting: Internal Medicine

## 2024-06-07 NOTE — Therapy (Signed)
 " OUTPATIENT PHYSICAL THERAPY FEMALE PELVIC TREATMENT   Patient Name: Katrina [name] MRN: [id number] DOB:07-08-1955, 69 y.o., female Today's Date: 06/08/2024  END OF SESSION:  PT End of Session - 06/08/24 1401     Visit Number 7    Date for Recertification  08/06/24    Authorization Type aetna [id number]  no auth req  medical necessity    Progress Note Due on Visit 10    PT Start Time 1400    PT Stop Time 1440    PT Time Calculation (min) 40 min    Activity Tolerance Patient tolerated treatment well;Patient limited by pain    Behavior During Therapy [facility] for tasks assessed/performed                Past Medical History:  Diagnosis Date   Arthritis    --rheumatoid   Colon polyp    Osteoporosis 04/05/14   T score -2.6 spine   Salpingitis    Had appendectomy at same time as salpingitis dx was made.   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, anus and anal canal (HCC) 01/27/2024   Situational insomnia 01/27/2024   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   Rectal bleeding 11/28/2023   Age-related osteoporosis without current pathological fracture 03/26/2023   Osteopenia 08/23/2022   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  03/15/2018   Chronic cough 01/27/2018   Rheumatoid arthritis (HCC) 04/05/2011    PCP: [name], MD  REFERRING PROVIDER: Autumn Millman,  MD  REFERRING DIAG: [phone] (ICD-10-CM) - Primary squamous cell carcinoma of anal canal  THERAPY DIAG:  Cramp and spasm  Other lack of coordination  Rationale for Evaluation and Treatment: Rehabilitation  ONSET DATE: July 2025  SUBJECTIVE:                                                                                                                                                                                           SUBJECTIVE STATEMENT: Patient reports that she is doing well,  has not been walking much due to the weather. Has been trying to do some dancing.  Has been doing some zoom yoga Forgets the wand        Last visit Patient reports that she feels more and more normal. She went to [facility] last week.  She got the wand  and worked with it this morning for 3 minutes She was able to insert it and relax and move it around Using Miralax  and Benefiber, she is feeling ok, not really constipated Appetite is really good, eating more raw vegetables Awaiting exam with colorectal surgeon next week Wants to work on global strengthening, missed it last week    eval Patient recently diagnosed with anal cancer. Patient reports that she has pain in the anal canal.  She awaiting radiation treatment Tx will be 28 sessions, 5 weeks.  Has a tumor that is obstructing the canal, takes miralax  daily Sitting is uncomfortable. She has been walking. Used to do yoga and swim but not since the diagnosis. She is nervous about the upcoming treatment.  Fluid intake: water  FUNCTIONAL LIMITATIONS: pretty healthy  PERTINENT HISTORY:  Medications for current condition: Mobic Surgeries: no Other: - Sexual abuse: No  DIAGNOSTIC FINDINGS:  Post-void residual: Voiding Cystourethrogram (VCUG):  Ultrasound: PAIN:  Are you having pain? yes NPRS scale: 4-7/10 Pain location: Internal  Pain type: aching, burning, and sharp Pain description: intermittent   Aggravating factors:  not Relieving factors: Meloxicam  PRECAUTIONS: None  RED FLAGS: None   WEIGHT BEARING RESTRICTIONS: No  FALLS:  Has patient fallen in last 6 months? No  OCCUPATION: retired  ACTIVITY LEVEL : pretty active, walks every day, used to do yoga, swims  PLOF: Independent  PATIENT GOALS: does not have any now   BOWEL MOVEMENT: Pain with bowel movement: Yes Type of bowel movement:Type (Bristol Stool Scale) 4, Frequency 3-5 times, Strain no, and Splinting no Fully empty rectum: no Leakage: some                                                   Pads: No Fiber supplement/laxative Yes miralax   URINATION:no issues  INTERCOURSE: not active   PREGNANCY:no   PROLAPSE: None   OBJECTIVE:  Note: Objective measures were completed at Evaluation unless otherwise noted.    PATIENT SURVEYS:    PFIQ-7: 102  COGNITION: Overall cognitive status: Within functional limits for tasks assessed     SENSATION: Light touch: Appears intact  LUMBAR SPECIAL TESTS:  Slump test: Negative  FUNCTIONAL TESTS:  5 times sit to stand: 7 Single leg stance: mild valgus bilat   Sit-up test:1/3  Squat:within functional limitations  Bed mobility:within functional limitations   GAIT: Assistive device utilized: None Comments: within functional limitations   POSTURE: No Significant postural limitations   LUMBARAROM/PROM:  A/PROM A/PROM  Eval (% available)  Flexion 90  Extension 90  Right lateral flexion 90  Left lateral flexion 90  Right rotation 90  Left rotation 90   (Blank rows = not tested)  LOWER EXTREMITY ROM: within functional limitations    LOWER EXTREMITY MMT:5/5   PALPATION:  General: tight and tender external and internal anal sphincter  Pelvic Alignment: even  Abdominal: able to dem 360 breathing with relaxed shoulders  Diastasis: No Distortion: No  Breathing: able to dem 360 breathing with relaxed shoulders Scar tissue:  no                External Perineal  Exam: within functional limitations                              Internal Pelvic Floor: tight and tender external and internal sphincters  Patient confirms identification and approves PT to assess internal pelvic floor and treatment No  PELVIC MMT:   MMT eval  Vaginal 2/5- 05/23/24  Internal Anal Sphincter 5/5  External Anal Sphincter 5/5  Puborectalis   Diastasis Recti no  (Blank rows = not tested)        TONE: high  PROLAPSE: no  TODAY'S TREATMENT:                                                                                                                              DATE:  06/08/2024 Nu Step for 10 minutes, level 5 and 4, bilateral upper and lower extremities, therapist present to discuss progress  Hip flexion 20 reps 3 plates Abduction 20 reps 3 plates Leg press 50 -70 lbs 50 reps Review of pelvic wand use ( recommended to use in the shower every other day) Rowing machine 1-2 plates 50 reps Bosu balance activities Bosu stepovers 10 reps bilateral Side lunges on bosu 10 reps bilateral    06/01/2024 Review of progress Review of how to use vaginal wand Nu Step for 12 minutes, level 5, bilateral upper and lower extremities, therapist present to discuss progress  Leg press 50 lbs 30 reps Rowing machine 2 plates 30 reps Foam balance act- tandem stance, heel rocking, single leg stance      05/23/2024 Patient confirms identification and approves physical therapist to perform internal soft tissue work   Education on vaginal and rectal dilators, vaginal wand Wand and dilators demonstrated Education on how to use the dilator or wand-with diaphragmatic breathing, knee fallout, 2 mins every other day   05/08/2024 There acts- stationary bike 7 mins with therapist present to discuss progress Hip abduction machine 3 plates bilateral 25 reps Hip extension machine 3 plates bilateral 25 reps Seated chest press #7 + exhale 20 reps Standing balance on foam - tandem stance  with head turns, unilat stance, heel rocking, holding #7, heel raises 20 reps Side stepping 3 laps #7 Sit to stand with #7 20 reps Walkaway with cable #20 lbs 10 reps Lat pulldown +exhale 25 reps 1 plate   87/77/7974 Nu Step for 12 minutes, level 5, bilateral upper and lower extremities, therapist present to discuss progress  Hip abduction machine 10 reps X 4 reps Leg press with exhale 55 lbs 30 reps Using zipper metaphor from yoga and breath to expand and contract pelvic floor Farmers' carry 2 laps #10 and #5 Lunge stretch with 2 blocks 2 mins bilat Diaphragmatic breathing in child's pose 2 reps   04/20/2024 Nu Step for 15 minutes, level  5, bilateral upper and lower extremities, therapist present to discuss progress  5 sit to stand 7:30 Education on potential use of rectal dilators  Bristol stool scale 4-6 now, not using miralax      02/01/2024  EVAL  Examination completed, findings reviewed, pt educated on POC, HEP, and female pelvic floor anatomy, reasoning with pelvic floor assessment internally with pt consent. Pt motivated to participate in PT and agreeable to attempt recommendations.     PATIENT EDUCATION/ there acts:   Education details: Pt was educated on relevant anatomy, exam findings, home exercise program, plan of care, expectations of PT and healthy bowel PT recommendations  Person educated: Patient Education method: Explanation, Demonstration, Tactile cues, Verbal cues, and Handouts Education comprehension: verbalized understanding, returned demonstration, verbal cues required, tactile cues required, and needs further education  HOME EXERCISE PROGRAM: Access Code: [email or web address] URL: [email or web address] Date: 02/01/2024 Prepared by: Cori Kemia Wendel  Exercises - Cat Cow  - 1 x daily - 7 x weekly - 2 sets - 10 reps - Supine Figure 4 Piriformis Stretch  - 1 x daily - 7 x weekly - 2 sets - 10 reps - Child's Pose Stretch  - 1 x daily - 7 x weekly - 2 sets -  10 reps - Supine Diaphragmatic Breathing with Pelvic Floor Lengthening  - 1 x daily - 7 x weekly - 2 sets - 10 reps - Pelvic Floor Lengthening in Hooklying  - 1 x daily - 7 x weekly - 2 sets - 10 reps  Patient Education - Get To Know Your Pelvic Floor- Female - High-Fiber Diet to Support Pelvic Health - Abdominal Massage for Constipation - Abdominal Massage for Constipation  ASSESSMENT:  CLINICAL IMPRESSION: Patient returning to PT after treatment for anal cancer. She has lost a lot of weight, has reduced endurance and strength, her goals include to get back to yoga, walking and swimming. She has improving bristol stool scale, sometimes constipated now, eating more fiber, walking and had more energy today again. Did well with her exercises, improving strength.  Will continue to benefit from PT to strengthen and  recover from cancer treatment.   OBJECTIVE IMPAIRMENTS: decreased coordination, decreased ROM, impaired tone, and pain.   ACTIVITY LIMITATIONS: sitting, continence, and toileting  PARTICIPATION LIMITATIONS: community activity  PERSONAL FACTORS: Fitness and 1 comorbidity: anal cancer are also affecting patient's functional outcome.   REHAB POTENTIAL: Good  CLINICAL DECISION MAKING: Evolving/moderate complexity  EVALUATION COMPLEXITY: Moderate   GOALS: Goals reviewed with patient? Yes  SHORT TERM GOALS: Target date: 02/29/2024    Pt will be independent with HEP.   Baseline: Goal status: met  2.  Pt will be independent with use of squatty potty, relaxed toileting mechanics, and improved bowel movement techniques in order to increase ease of bowel movements and complete evacuation.   Baseline:  Goal status: ongoing 05/23/24  3.  Patient will be educated on rectal dilators Baseline:  Goal status: met 05/23/24  4.  Patient will soak 0 pads/ day Baseline:  Goal status: met 05/23/24   LONG TERM GOALS: Target date: 04/25/2024 updated to 07/25/2023    Pt will be  independent with advanced HEP.   Baseline:  Goal status: met  2.  Patient will be I with use of dilators  Baseline:  Goal status: met- I with wand  3.  Patient will have regular bowel movements, bristol stool scale type 3-4 Baseline: 7 Goal status: INITIAL  4.  Patient will participate in 40 minute PT session without  increased pain Baseline:  Goal status:  met 06/01/2024  5.  Patient will be able to sit as long as needed- at least 1 hr- without increased pain Baseline:  Goal status: ongoing  6.  Patient will report no fecal accidents/ day Baseline:  Goal status: ongoing  PLAN:  PT FREQUENCY: 1-2x/week  PT DURATION: 12 weeks  PLANNED INTERVENTIONS: 97110-Therapeutic exercises, 97530- Therapeutic activity, 97112- Neuromuscular re-education, 97535- Self Care, [id number]- Manual therapy, [phone]- Gait training, [phone] (1-2 muscles), [phone] (3+ muscles)- Dry Needling, Patient/Family education, Balance training, Taping, Joint mobilization, Joint manipulation, Spinal manipulation, Spinal mobilization, Manual lymph drainage, Scar mobilization, Cryotherapy, Moist heat, and Biofeedback  PLAN FOR NEXT SESSION: reassessment after radiation, educate patient on dilators, continue exercises for down training/ up training as needed, continue with balance and global strengthening, endurance, improved energy, check O2 sats   Aryona Sill, PT 06/08/2024, 2:01 PM  "

## 2024-06-07 NOTE — Telephone Encounter (Signed)
 Done

## 2024-06-08 ENCOUNTER — Ambulatory Visit: Admitting: Physical Therapy

## 2024-06-08 ENCOUNTER — Other Ambulatory Visit: Payer: Medicare HMO

## 2024-06-08 ENCOUNTER — Other Ambulatory Visit

## 2024-06-08 DIAGNOSIS — Z1329 Encounter for screening for other suspected endocrine disorder: Secondary | ICD-10-CM

## 2024-06-08 DIAGNOSIS — R278 Other lack of coordination: Secondary | ICD-10-CM

## 2024-06-08 DIAGNOSIS — M810 Age-related osteoporosis without current pathological fracture: Secondary | ICD-10-CM

## 2024-06-08 DIAGNOSIS — Z13220 Encounter for screening for lipoid disorders: Secondary | ICD-10-CM

## 2024-06-08 DIAGNOSIS — Z0000 Encounter for general adult medical examination without abnormal findings: Secondary | ICD-10-CM

## 2024-06-08 DIAGNOSIS — R252 Cramp and spasm: Secondary | ICD-10-CM

## 2024-06-08 DIAGNOSIS — M8580 Other specified disorders of bone density and structure, unspecified site: Secondary | ICD-10-CM

## 2024-06-09 LAB — COMPREHENSIVE METABOLIC PANEL WITH GFR
AG Ratio: 1.3 (calc) (ref 1.0–2.5)
ALT: 24 U/L (ref 6–29)
AST: 24 U/L (ref 10–35)
Albumin: 3.5 g/dL — ABNORMAL LOW (ref 3.6–5.1)
Alkaline phosphatase (APISO): 61 U/L (ref 37–153)
BUN: 14 mg/dL (ref 7–25)
CO2: 30 mmol/L (ref 20–32)
Calcium: 8.7 mg/dL (ref 8.6–10.4)
Chloride: 107 mmol/L (ref 98–110)
Creat: 0.63 mg/dL (ref 0.50–1.05)
Globulin: 2.8 g/dL (ref 1.9–3.7)
Glucose, Bld: 93 mg/dL (ref 65–99)
Potassium: 4.9 mmol/L (ref 3.5–5.3)
Sodium: 143 mmol/L (ref 135–146)
Total Bilirubin: 0.5 mg/dL (ref 0.2–1.2)
Total Protein: 6.3 g/dL (ref 6.1–8.1)
eGFR: 96 mL/min/{1.73_m2}

## 2024-06-09 LAB — CBC WITH DIFFERENTIAL/PLATELET
Absolute Lymphocytes: 1498 {cells}/uL (ref 850–3900)
Absolute Monocytes: 557 {cells}/uL (ref 200–950)
Basophils Absolute: 62 {cells}/uL (ref 0–200)
Basophils Relative: 1.3 %
Eosinophils Absolute: 274 {cells}/uL (ref 15–500)
Eosinophils Relative: 5.7 %
HCT: 39.7 % (ref 35.9–46.0)
Hemoglobin: 13 g/dL (ref 11.7–15.5)
MCH: 32.5 pg (ref 27.0–33.0)
MCHC: 32.7 g/dL (ref 31.6–35.4)
MCV: 99.3 fL (ref 81.4–101.7)
MPV: 9.3 fL (ref 7.5–12.5)
Monocytes Relative: 11.6 %
Neutro Abs: 2410 {cells}/uL (ref 1500–7800)
Neutrophils Relative %: 50.2 %
Platelets: 208 10*3/uL (ref 140–400)
RBC: 4 Million/uL (ref 3.80–5.10)
RDW: 14.2 % (ref 11.0–15.0)
Total Lymphocyte: 31.2 %
WBC: 4.8 10*3/uL (ref 3.8–10.8)

## 2024-06-09 LAB — LIPID PANEL
Cholesterol: 200 mg/dL — ABNORMAL HIGH
HDL: 83 mg/dL
LDL Cholesterol (Calc): 96 mg/dL
Non-HDL Cholesterol (Calc): 117 mg/dL
Total CHOL/HDL Ratio: 2.4 (calc)
Triglycerides: 116 mg/dL

## 2024-06-09 LAB — TSH: TSH: 4.02 m[IU]/L (ref 0.40–4.50)

## 2024-06-12 ENCOUNTER — Encounter: Payer: Medicare HMO | Admitting: Internal Medicine

## 2024-06-15 ENCOUNTER — Ambulatory Visit: Admitting: Physical Therapy

## 2024-07-05 ENCOUNTER — Other Ambulatory Visit (HOSPITAL_COMMUNITY)

## 2024-07-06 ENCOUNTER — Ambulatory Visit: Admitting: Physical Therapy

## 2024-07-10 ENCOUNTER — Ambulatory Visit: Admitting: Physical Therapy

## 2024-07-12 ENCOUNTER — Inpatient Hospital Stay: Attending: Oncology | Admitting: Oncology

## 2024-07-17 ENCOUNTER — Ambulatory Visit: Admitting: Physical Therapy

## 2024-08-17 ENCOUNTER — Ambulatory Visit: Admitting: Radiation Oncology

## 2024-10-31 ENCOUNTER — Encounter (HOSPITAL_COMMUNITY)
